# Patient Record
Sex: Female | Born: 1985 | Race: White | Hispanic: No | Smoking: Former smoker
Health system: Southern US, Community
[De-identification: ages and names within clinical notes are randomized; demographics above are authoritative.]

## PROBLEM LIST (undated history)

## (undated) DIAGNOSIS — Z923 Personal history of irradiation: Secondary | ICD-10-CM

## (undated) DIAGNOSIS — F329 Major depressive disorder, single episode, unspecified: Secondary | ICD-10-CM

## (undated) DIAGNOSIS — F419 Anxiety disorder, unspecified: Secondary | ICD-10-CM

## (undated) DIAGNOSIS — C539 Malignant neoplasm of cervix uteri, unspecified: Secondary | ICD-10-CM

## (undated) DIAGNOSIS — F32A Depression, unspecified: Secondary | ICD-10-CM

## (undated) DIAGNOSIS — D701 Agranulocytosis secondary to cancer chemotherapy: Secondary | ICD-10-CM

## (undated) DIAGNOSIS — Z9221 Personal history of antineoplastic chemotherapy: Secondary | ICD-10-CM

## (undated) DIAGNOSIS — T451X5A Adverse effect of antineoplastic and immunosuppressive drugs, initial encounter: Secondary | ICD-10-CM

## (undated) HISTORY — DX: Anxiety disorder, unspecified: F41.9

## (undated) HISTORY — DX: Major depressive disorder, single episode, unspecified: F32.9

## (undated) HISTORY — PX: CYST REMOVAL HAND: SHX6279

## (undated) HISTORY — PX: WISDOM TOOTH EXTRACTION: SHX21

## (undated) HISTORY — DX: Depression, unspecified: F32.A

---

## 2005-05-21 ENCOUNTER — Emergency Department (HOSPITAL_COMMUNITY): Admission: EM | Admit: 2005-05-21 | Discharge: 2005-05-21 | Payer: Self-pay | Admitting: Emergency Medicine

## 2006-09-04 ENCOUNTER — Emergency Department (HOSPITAL_COMMUNITY): Admission: EM | Admit: 2006-09-04 | Discharge: 2006-09-04 | Payer: Self-pay | Admitting: Emergency Medicine

## 2007-11-19 ENCOUNTER — Emergency Department (HOSPITAL_COMMUNITY): Admission: EM | Admit: 2007-11-19 | Discharge: 2007-11-19 | Payer: Self-pay | Admitting: Emergency Medicine

## 2010-11-03 ENCOUNTER — Emergency Department (HOSPITAL_BASED_OUTPATIENT_CLINIC_OR_DEPARTMENT_OTHER)
Admission: EM | Admit: 2010-11-03 | Discharge: 2010-11-03 | Disposition: A | Discharge status: Home / Self Care | Payer: 59 | Attending: Emergency Medicine | Admitting: Emergency Medicine

## 2010-11-03 DIAGNOSIS — F17200 Nicotine dependence, unspecified, uncomplicated: Secondary | ICD-10-CM | POA: Insufficient documentation

## 2010-11-03 DIAGNOSIS — S30860A Insect bite (nonvenomous) of lower back and pelvis, initial encounter: Secondary | ICD-10-CM | POA: Insufficient documentation

## 2010-11-03 DIAGNOSIS — W57XXXA Bitten or stung by nonvenomous insect and other nonvenomous arthropods, initial encounter: Secondary | ICD-10-CM | POA: Insufficient documentation

## 2011-03-24 LAB — CBC
HCT: 40.2
Hemoglobin: 14.2
MCV: 86.2
Platelets: 313
WBC: 10.8 — ABNORMAL HIGH

## 2011-03-24 LAB — URINALYSIS, ROUTINE W REFLEX MICROSCOPIC
Glucose, UA: NEGATIVE
Protein, ur: NEGATIVE
pH: 5.5

## 2011-03-24 LAB — DIFFERENTIAL
Eosinophils Absolute: 0
Eosinophils Relative: 0
Lymphs Abs: 1.2
Monocytes Absolute: 0.8
Monocytes Relative: 7

## 2011-03-24 LAB — COMPREHENSIVE METABOLIC PANEL
BUN: 8
CO2: 25
Calcium: 8.4
Creatinine, Ser: 0.72
GFR calc Af Amer: >60
GFR calc non Af Amer: >60
Glucose, Bld: 107 — ABNORMAL HIGH
Total Bilirubin: 0.6

## 2011-03-24 LAB — LIPASE, BLOOD: Lipase: 12

## 2011-03-24 LAB — URINE MICROSCOPIC-ADD ON

## 2011-07-22 ENCOUNTER — Emergency Department (INDEPENDENT_AMBULATORY_CARE_PROVIDER_SITE_OTHER)
Admission: EM | Admit: 2011-07-22 | Discharge: 2011-07-22 | Disposition: A | Discharge status: Home / Self Care | Payer: Self-pay | Source: Home / Self Care | Attending: Family Medicine | Admitting: Family Medicine

## 2011-07-22 ENCOUNTER — Encounter (HOSPITAL_COMMUNITY): Payer: Self-pay | Admitting: Family Medicine

## 2011-07-22 DIAGNOSIS — J40 Bronchitis, not specified as acute or chronic: Secondary | ICD-10-CM

## 2011-07-22 MED ORDER — ALBUTEROL SULFATE HFA 108 (90 BASE) MCG/ACT IN AERS: 1.0000 | INHALATION_SPRAY | Freq: Four times a day (QID) | RESPIRATORY_TRACT | Status: DC | PRN

## 2011-07-22 MED ORDER — PREDNISONE 20 MG PO TABS: ORAL_TABLET | ORAL | Status: AC

## 2011-07-22 MED ORDER — AZITHROMYCIN 250 MG PO TABS: 250.0000 mg | ORAL_TABLET | Freq: Every day | ORAL | Status: AC

## 2011-07-22 MED ORDER — PHENYLEPHRINE-BROMPHEN-CODEINE 7.5-4-10 MG/5ML PO LIQD: ORAL | Status: DC

## 2011-07-22 NOTE — ED Notes (Signed)
Chest pain onset yesterday.  Difficulty breathing started 07/16/2011.  C/o headache, coughing, runny nose, generalized weakness.  Left lower rib cage pain. No vomiting, but feeling nauseated

## 2011-07-22 NOTE — ED Provider Notes (Signed)
History     CSN: [id number]  Arrival date & time 07/22/11  1746   First MD Initiated Contact with Patient 07/22/11 1800      Chief Complaint  Patient presents with  . Chest Pain    (Consider location/radiation/quality/duration/timing/severity/associated sxs/prior treatment) HPI Comments: 26 y/o female smoker (1pck per day) here c/o productive cough with green sputum and congestion with wheezing and chest tightness for about 5 days. 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 Pain with coughing in left lower ribs since yesterday. No fever or rigors. Feels tired in general. Took guaifenesin for her symptoms with minimal improvement.   History reviewed. No pertinent past medical history.  No past surgical history on file.  No family history on file.  History  Substance Use Topics  . Smoking status: Not on file  . Smokeless tobacco: Not on file  . Alcohol Use:     OB History    Grav Para Term Preterm Abortions TAB SAB Ect Mult Living                  Review of Systems  Constitutional: Positive for fatigue. Negative for fever and chills.  HENT: Positive for congestion and rhinorrhea. Negative for sore throat and trouble swallowing.   Respiratory: Positive for cough, chest tightness and wheezing.   Cardiovascular: Negative for palpitations and leg swelling.  Gastrointestinal: Negative for vomiting, abdominal pain and diarrhea.  Skin: Negative for rash.    Allergies  Penicillins  Home Medications   Current Outpatient Rx  Name Route Sig Dispense Refill  . GUAIFENESIN 100 MG/5ML PO SYRP Oral Take 200 mg by mouth 3 (three) times daily as needed.    Marland Kitchen OVER THE COUNTER MEDICATION      . ALBUTEROL SULFATE HFA 108 (90 BASE) MCG/ACT IN AERS Inhalation Inhale 1-2 puffs into the lungs every 6 (six) hours as needed for wheezing. 1 Inhaler 0  . AZITHROMYCIN 250 MG PO TABS Oral Take 1 tablet (250 mg total) by  mouth daily. Take first 2 tablets together, then 1 every day until finished. 6 tablet 0  . PHENYLEPHRINE-BROMPHEN-CODEINE 7.10-29-08 MG/5ML PO LIQD  5 mls qid prn for cough 100 mL 0  . PREDNISONE 20 MG PO TABS  2 tabs po daily for 5 days 10 tablet no    BP 130/82  Pulse 76  Temp(Src) 99 F (37.2 C) (Oral)  Resp 18  SpO2 100%  LMP 06/21/2011  Physical Exam  Nursing note and vitals reviewed. Constitutional: She is oriented to person, place, and time. She appears well-developed and well-nourished. No distress.  HENT:  Head: Normocephalic and atraumatic.  Right Ear: External ear normal.  Left Ear: External ear normal.  Mouth/Throat: No oropharyngeal exudate.       Nasal Congestion with erythema and swelling of nasal turbinates, clear rhinorrhea. Mild pharyngeal erythema no exudates. No uvula deviation. No trismus. TM's clear fluid behind.   Eyes: Conjunctivae and EOM are normal. Pupils are equal, round, and reactive to light.  Neck: Neck supple.  Cardiovascular: Normal rate, regular rhythm and normal heart sounds.   Pulmonary/Chest: Effort normal. No respiratory distress. She has no wheezes. She has no rales.       Bronchitic cough. Bilateral expiratory rhonchi with cough, no active wheezing, no crackles.  Lymphadenopathy:    She has no cervical adenopathy.  Neurological: She is alert and oriented to person, place, and time.  Skin: No  rash noted.    ED Course  Procedures (including critical care time)  Labs Reviewed - No data to display No results found.   1. Bronchitis       MDM  Encouraged to quit smoking. Chest wall pain related to cough.Treated with azithromycin, albuterol, prednisone, Bromphed . Return if fever or worsening symptoms despite following treatment.       Sharin Grave, MD 07/23/11 2300

## 2011-07-24 ENCOUNTER — Encounter (HOSPITAL_COMMUNITY): Payer: Self-pay | Admitting: Emergency Medicine

## 2013-10-17 ENCOUNTER — Emergency Department (HOSPITAL_COMMUNITY)
Admission: EM | Admit: 2013-10-17 | Discharge: 2013-10-18 | Disposition: A | Discharge status: Home / Self Care | Payer: 59 | Attending: Emergency Medicine | Admitting: Emergency Medicine

## 2013-10-17 ENCOUNTER — Encounter (HOSPITAL_COMMUNITY): Payer: Self-pay | Admitting: Emergency Medicine

## 2013-10-17 DIAGNOSIS — L237 Allergic contact dermatitis due to plants, except food: Secondary | ICD-10-CM

## 2013-10-17 DIAGNOSIS — Z79899 Other long term (current) drug therapy: Secondary | ICD-10-CM | POA: Insufficient documentation

## 2013-10-17 DIAGNOSIS — L255 Unspecified contact dermatitis due to plants, except food: Secondary | ICD-10-CM | POA: Insufficient documentation

## 2013-10-17 DIAGNOSIS — F17200 Nicotine dependence, unspecified, uncomplicated: Secondary | ICD-10-CM | POA: Insufficient documentation

## 2013-10-17 DIAGNOSIS — Z880 Allergy status to penicillin: Secondary | ICD-10-CM | POA: Insufficient documentation

## 2013-10-17 MED ORDER — PREDNISONE 20 MG PO TABS
60.0000 mg | ORAL_TABLET | Freq: Once | ORAL | Status: AC
  Administered 2013-10-18: 60 mg via ORAL
  Filled 2013-10-17: qty 3

## 2013-10-17 MED ORDER — HYDROXYZINE HCL 25 MG PO TABS
25.0000 mg | ORAL_TABLET | Freq: Once | ORAL | Status: AC
  Administered 2013-10-18: 25 mg via ORAL
  Filled 2013-10-17: qty 1

## 2013-10-17 NOTE — ED Provider Notes (Signed)
CSN: [id number]     Arrival date & time 10/17/13  2235 History  This chart was scribed for non-physician practitioner Delos Haring, PA-C working with Varney Biles, MD by Zettie Pho, ED Scribe. This patient was seen in room TR05C/TR05C and the patient's care was started at 12:24 AM.    Chief Complaint  Patient presents with  . Poison Oak   The history is provided by the patient. No language interpreter was used.   HPI Comments: Joanne Griffin is a 28 y.o. female who presents to the Emergency Department complaining of an itching rash to the bilateral arms and legs onset a few days ago. Patient expresses concern that she came into contact with poison oak while doing yard work  5-6 days ago. She states that her current symptoms feel similar to her prior reactions to poison oak. She reports taking Benadryl at home, last dose was about 14 hours ago, and applying calamine lotion to the areas with mild, temporary relief. She denies shortness of breath, wheezing, wheezing, mouth/tongue/lip swelling. She reports an allergy to Penicillins. Patient has no other pertinent medical history.   Past Medical History  Diagnosis Date  . Cyst, dermoid, arm    History reviewed. No pertinent past surgical history. No family history on file. History  Substance Use Topics  . Smoking status: Current Every Day Smoker -- 1.00 packs/day  . Smokeless tobacco: Not on file  . Alcohol Use: No   OB History   Grav Para Term Preterm Abortions TAB SAB Ect Mult Living                 Review of Systems  Respiratory: Negative for shortness of breath and wheezing.   Skin: Positive for rash.  All other systems reviewed and are negative.   Allergies  Penicillins  Home Medications   Prior to Admission medications   Medication Sig Start Date End Date Taking? Authorizing Provider  albuterol (PROVENTIL HFA;VENTOLIN HFA) 108 (90 BASE) MCG/ACT inhaler Inhale 1-2 puffs into the lungs every 6 (six) hours as needed for  wheezing. 07/22/11 07/21/12  Adlih Moreno-Coll, MD  guaifenesin (ROBITUSSIN) 100 MG/5ML syrup Take 200 mg by mouth 3 (three) times daily as needed.    Historical Provider, MD  [place]     Historical Provider, MD  Phenylephrine-Bromphen-Codeine 7.10-29-08 MG/5ML LIQD 5 mls qid prn for cough 07/22/11   Adlih Moreno-Coll, MD   Triage Vitals: BP 119/66  Pulse 95  Temp(Src) 97.5 F (36.4 C) (Oral)  Resp 20  SpO2 100%  LMP 10/10/2013  Physical Exam  Nursing note and vitals reviewed. Constitutional: She is oriented to person, place, and time. She appears well-developed and well-nourished. No distress.  HENT:  Head: Normocephalic and atraumatic.  No pharyngeal, lip, or tongue swelling.   Eyes: Conjunctivae are normal.  No lesions about the eyes.   Neck: Normal range of motion. Neck supple.  Pulmonary/Chest: Effort normal. No respiratory distress. She has no wheezes.  Abdominal: She exhibits no distension.  Musculoskeletal: Normal range of motion.  Neurological: She is alert and oriented to person, place, and time.  Skin: Skin is warm and dry. Rash noted. Rash is urticarial.  Diffuse rash to arms, legs. The rash spares the chest, back, face.   Skin heavily coated in calamine lotion.  Psychiatric: She has a normal mood and affect. Her behavior is normal.    ED Course  Procedures (including critical care time)  DIAGNOSTIC STUDIES: Oxygen Saturation is 100% on room air,  normal by my interpretation.    COORDINATION OF CARE: 11:45 PM- Ordered Atarax and prednisone 60 mg PO.   12:26 AM- Patient states that the itching has only slightly improved after receiving the medications. Advised of further symptomatic care at home. Return precautions given. Discussed treatment plan with patient at bedside and patient verbalized agreement.     Labs Review Labs Reviewed - No data to display  Imaging Review No results found.   EKG Interpretation None      MDM   Final  diagnoses:  Poison oak dermatitis    Pt given prednisone and Atarax in the ED to treat symptoms. NO systemic symptoms or upper/lower airway involvement. She will need to be on a prednisone dose pack at home. Will Rx Atarax for itching and return precautions given.  28 y.o.[place] evaluation in the Emergency Department is complete. It has been determined that no acute conditions requiring further emergency intervention are present at this time. The patient/guardian have been advised of the diagnosis and plan. We have discussed signs and symptoms that warrant return to the ED, such as changes or worsening in symptoms.  Vital signs are stable at discharge. Filed Vitals:   10/18/13 0054  BP: 114/72  Pulse: 77  Temp:   Resp:     Patient/guardian has voiced understanding and agreed to follow-up with the PCP or specialist.  I personally performed the services described in this documentation, which was scribed in my presence. The recorded information has been reviewed and is accurate.    Linus Mako, PA-C 10/18/13 1322

## 2013-10-17 NOTE — ED Notes (Signed)
Pt reports doing yard work a few days ago and has not developed poison oak. Bumps to bilateral arms and legs. Pt put Calamine lotion on and has taken benadryl, last dose this am.

## 2013-10-18 MED ORDER — METHYLPREDNISOLONE SODIUM SUCC 125 MG IJ SOLR: 125.0000 mg | Freq: Once | INTRAMUSCULAR | Status: DC

## 2013-10-18 MED ORDER — HYDROXYZINE HCL 25 MG PO TABS: 25.0000 mg | ORAL_TABLET | Freq: Four times a day (QID) | ORAL | Status: DC

## 2013-10-18 MED ORDER — PREDNISONE 10 MG PO TABS: 20.0000 mg | ORAL_TABLET | Freq: Every day | ORAL | Status: DC

## 2013-10-18 NOTE — Discharge Instructions (Signed)
Poison Ivy Poison ivy is a inflammation of the skin (contact dermatitis) caused by touching the allergens on the leaves of the ivy plant following previous exposure to the plant. The rash usually appears 48 hours after exposure. The rash is usually bumps (papules) or blisters (vesicles) in a linear pattern. Depending on your own sensitivity, the rash may simply cause redness and itching, or it may also progress to blisters which may break open. These must be well cared for to prevent secondary bacterial (germ) infection, followed by scarring. Keep any open areas dry, clean, dressed, and covered with an antibacterial ointment if needed. The eyes may also get puffy. The puffiness is worst in the morning and gets better as the day progresses. This dermatitis usually heals without scarring, within 2 to 3 weeks without treatment. HOME CARE INSTRUCTIONS  Thoroughly wash with soap and water as soon as you have been exposed to poison ivy. You have about one half hour to remove the plant resin before it will cause the rash. This washing will destroy the oil or antigen on the skin that is causing, or will cause, the rash. Be sure to wash under your fingernails as any plant resin there will continue to spread the rash. Do not rub skin vigorously when washing affected area. Poison ivy cannot spread if no oil from the plant remains on your body. A rash that has progressed to weeping sores will not spread the rash unless you have not washed thoroughly. It is also important to wash any clothes you have been wearing as these may carry active allergens. The rash will return if you wear the unwashed clothing, even several days later. Avoidance of the plant in the future is the best measure. Poison ivy plant can be recognized by the number of leaves. Generally, poison ivy has three leaves with flowering branches on a single stem. Diphenhydramine may be purchased over the counter and used as needed for itching. Do not drive with  this medication if it makes you drowsy.Ask your caregiver about medication for children. SEEK MEDICAL CARE IF:  Open sores develop.  Redness spreads beyond area of rash.  You notice purulent (pus-like) discharge.  You have increased pain.  Other signs of infection develop (such as fever). Document Released: 06/11/2000 Document Revised: 09/06/2011 Document Reviewed: 04/30/2009 [employer] Patient Information 2014 [employer].  

## 2013-10-19 NOTE — ED Provider Notes (Signed)
Medical screening examination/treatment/procedure(s) were performed by non-physician practitioner and as supervising physician I was immediately available for consultation/collaboration.   EKG Interpretation None       Varney Biles, MD 10/19/13 [phone]

## 2015-09-24 ENCOUNTER — Emergency Department (HOSPITAL_COMMUNITY)
Admission: EM | Admit: 2015-09-24 | Discharge: 2015-09-24 | Disposition: A | Discharge status: Home / Self Care | Payer: Self-pay | Attending: Emergency Medicine | Admitting: Emergency Medicine

## 2015-09-24 ENCOUNTER — Encounter (HOSPITAL_COMMUNITY): Payer: Self-pay | Admitting: Emergency Medicine

## 2015-09-24 DIAGNOSIS — L918 Other hypertrophic disorders of the skin: Secondary | ICD-10-CM | POA: Insufficient documentation

## 2015-09-24 DIAGNOSIS — Z7952 Long term (current) use of systemic steroids: Secondary | ICD-10-CM | POA: Insufficient documentation

## 2015-09-24 DIAGNOSIS — F17200 Nicotine dependence, unspecified, uncomplicated: Secondary | ICD-10-CM | POA: Insufficient documentation

## 2015-09-24 DIAGNOSIS — Z3202 Encounter for pregnancy test, result negative: Secondary | ICD-10-CM | POA: Insufficient documentation

## 2015-09-24 DIAGNOSIS — K6289 Other specified diseases of anus and rectum: Secondary | ICD-10-CM | POA: Insufficient documentation

## 2015-09-24 DIAGNOSIS — Z880 Allergy status to penicillin: Secondary | ICD-10-CM | POA: Insufficient documentation

## 2015-09-24 DIAGNOSIS — N390 Urinary tract infection, site not specified: Secondary | ICD-10-CM | POA: Insufficient documentation

## 2015-09-24 DIAGNOSIS — Z86018 Personal history of other benign neoplasm: Secondary | ICD-10-CM | POA: Insufficient documentation

## 2015-09-24 DIAGNOSIS — Z79899 Other long term (current) drug therapy: Secondary | ICD-10-CM | POA: Insufficient documentation

## 2015-09-24 LAB — URINALYSIS, ROUTINE W REFLEX MICROSCOPIC
BILIRUBIN URINE: NEGATIVE
Glucose, UA: NEGATIVE mg/dL
Ketones, ur: NEGATIVE mg/dL
Nitrite: NEGATIVE
PH: 8 (ref 5.0–8.0)
Protein, ur: 30 mg/dL — AB
SPECIFIC GRAVITY, URINE: 1.024 (ref 1.005–1.030)

## 2015-09-24 LAB — URINE MICROSCOPIC-ADD ON

## 2015-09-24 LAB — POC URINE PREG, ED: Preg Test, Ur: NEGATIVE

## 2015-09-24 MED ORDER — CEPHALEXIN 500 MG PO CAPS: 500.0000 mg | ORAL_CAPSULE | Freq: Four times a day (QID) | ORAL | Status: DC

## 2015-09-24 NOTE — ED Notes (Signed)
Pt sts lower abd pain and pain in rectum area x 2 days

## 2015-09-24 NOTE — Discharge Instructions (Signed)

## 2015-09-24 NOTE — ED Provider Notes (Signed)
CSN: [id number]     Arrival date & time 09/24/15  1311 History  By signing my name below, I, Eustaquio Maize, attest that this documentation has been prepared under the direction and in the presence of Alyse Low, [place].  Electronically Signed: Eustaquio Maize, ED Scribe. 09/24/2015. 5:05 PM.   Chief Complaint  Patient presents with  . Abdominal Pain   The history is provided by the patient. No language interpreter was used.     HPI Comments: Joanne Griffin is a 30 y.o. female who presents to the Emergency Department complaining of sudden onset, intermittent, sharp, rectal pain x 2 days. Pt's last normal bowel movement was this morning but she denies any relief from the pain afterwards. Pt also complains of lower abdominal pain that is exacerbated with applying pressure to the area. No hx hemorrhoids. Denies constipation, nausea, vomiting, or any other associated symptoms. Pt states she has not had a period in "awhile" but denies risk of pregnancy due to not having intercourse with males. Pt is everyday smoker.    Past Medical History  Diagnosis Date  . Cyst, dermoid, arm    History reviewed. No pertinent past surgical history. History reviewed. No pertinent family history. Social History  Substance Use Topics  . Smoking status: Current Every Day Smoker -- 1.00 packs/day  . Smokeless tobacco: None  . Alcohol Use: No   OB History    No data available     Review of Systems  Constitutional: Negative for fever.  Gastrointestinal: Positive for abdominal pain and rectal pain. Negative for nausea and vomiting.  All other systems reviewed and are negative.  Allergies  Penicillins  Home Medications   Prior to Admission medications   Medication Sig Start Date End Date Taking? Authorizing Provider  albuterol (PROVENTIL HFA;VENTOLIN HFA) 108 (90 BASE) MCG/ACT inhaler Inhale 1-2 puffs into the lungs every 6 (six) hours as needed for wheezing. 07/22/11 07/21/12  Adlih Moreno-Coll, MD   guaifenesin (ROBITUSSIN) 100 MG/5ML syrup Take 200 mg by mouth 3 (three) times daily as needed.    Historical Provider, MD  hydrOXYzine (ATARAX/VISTARIL) 25 MG tablet Take 1 tablet (25 mg total) by mouth every 6 (six) hours. 10/18/13   Tiffany Carlota Raspberry, PA-C  OVER THE COUNTER MEDICATION     Historical Provider, MD  Phenylephrine-Bromphen-Codeine 7.10-29-08 MG/5ML LIQD 5 mls qid prn for cough 07/22/11   Adlih Moreno-Coll, MD  predniSONE (DELTASONE) 10 MG tablet Take 2 tablets (20 mg total) by mouth daily. 10/18/13   Tiffany Carlota Raspberry, PA-C   BP 111/76 mmHg  Pulse 80  Temp(Src) 98.3 F (36.8 C) (Oral)  Resp 18  SpO2 100%   Physical Exam  Constitutional: She is oriented to person, place, and time. She appears well-developed and well-nourished. No distress.  HENT:  Head: Normocephalic and atraumatic.  Eyes: Conjunctivae and EOM are normal.  Neck: Neck supple. No tracheal deviation present.  Cardiovascular: Normal rate.   Pulmonary/Chest: Effort normal. No respiratory distress.  Abdominal: There is tenderness in the suprapubic area. There is no guarding.  No peritoneal signs.   Genitourinary: Rectal exam shows no external hemorrhoid and no internal hemorrhoid.  No hemorrhoids.  Small skin tag.   Musculoskeletal: Normal range of motion.  Neurological: She is alert and oriented to person, place, and time.  Skin: Skin is warm and dry.  Psychiatric: She has a normal mood and affect. Her behavior is normal.  Nursing note and vitals reviewed.   ED Course  Procedures (including critical care time)  DIAGNOSTIC STUDIES: Oxygen Saturation is 99% on RA, normal by my interpretation.    COORDINATION OF CARE: 5:05 PM-Discussed treatment plan with pt at bedside and pt agreed to plan.   Labs Review Labs Reviewed  URINALYSIS, ROUTINE W REFLEX MICROSCOPIC (NOT AT [facility]) - Abnormal; Notable for the following:    APPearance CLOUDY (*)    Hgb urine dipstick SMALL (*)    Protein, ur 30 (*)    Leukocytes,  UA LARGE (*)    All other components within normal limits  URINE MICROSCOPIC-ADD ON - Abnormal; Notable for the following:    Squamous Epithelial / LPF 6-30 (*)    Bacteria, UA MANY (*)    All other components within normal limits  POC URINE PREG, ED    Imaging Review No results found. I have personally reviewed and evaluated these lab results as part of my medical decision-making.   EKG Interpretation None      MDM   Final diagnoses:  UTI (lower urinary tract infection)   Pt has UTI. No evidence of acute abdomen. I suspect lower abdominal pain and rectal pain due to bladder infectoin/UTI. Will treat with Keflex. Pt advised to return if symptoms worsen or change.   Meds ordered this encounter  Medications  . DISCONTD: cephALEXin (KEFLEX) 500 MG capsule    Sig: Take 1 capsule (500 mg total) by mouth 4 (four) times daily.    Dispense:  40 capsule    Refill:  0    Order Specific Question:  Supervising Provider    Answer:  MILLER, BRIAN [[id number]]  . cephALEXin (KEFLEX) 500 MG capsule    Sig: Take 1 capsule (500 mg total) by mouth 4 (four) times daily.    Dispense:  40 capsule    Refill:  0    Order Specific Question:  Supervising Provider    Answer:  Noemi Chapel [[id number]]  An After Visit Summary was printed and given to the patient.     Hollace Kinnier Chubbuck, PA-C 09/24/15 2024  Leonard Schwartz, MD 09/25/15 2133

## 2015-09-24 NOTE — ED Notes (Signed)
Pt verbalized understanding of d/c instructions and use of keflex and has no further questions. Pt stable and NAD

## 2016-07-29 ENCOUNTER — Encounter (HOSPITAL_COMMUNITY): Payer: Self-pay

## 2016-07-29 ENCOUNTER — Emergency Department (HOSPITAL_COMMUNITY)
Admission: EM | Admit: 2016-07-29 | Discharge: 2016-07-29 | Disposition: A | Discharge status: Home / Self Care | Payer: Self-pay | Attending: Emergency Medicine | Admitting: Emergency Medicine

## 2016-07-29 ENCOUNTER — Emergency Department (HOSPITAL_COMMUNITY): Payer: Self-pay

## 2016-07-29 DIAGNOSIS — N889 Noninflammatory disorder of cervix uteri, unspecified: Secondary | ICD-10-CM

## 2016-07-29 DIAGNOSIS — F17200 Nicotine dependence, unspecified, uncomplicated: Secondary | ICD-10-CM | POA: Insufficient documentation

## 2016-07-29 DIAGNOSIS — R109 Unspecified abdominal pain: Secondary | ICD-10-CM

## 2016-07-29 DIAGNOSIS — R87619 Unspecified abnormal cytological findings in specimens from cervix uteri: Secondary | ICD-10-CM | POA: Insufficient documentation

## 2016-07-29 DIAGNOSIS — N12 Tubulo-interstitial nephritis, not specified as acute or chronic: Secondary | ICD-10-CM | POA: Insufficient documentation

## 2016-07-29 DIAGNOSIS — Z79899 Other long term (current) drug therapy: Secondary | ICD-10-CM | POA: Insufficient documentation

## 2016-07-29 LAB — CBC WITH DIFFERENTIAL/PLATELET
BASOS ABS: 0 10*3/uL (ref 0.0–0.1)
BASOS PCT: 0 %
EOS ABS: 0.2 10*3/uL (ref 0.0–0.7)
EOS PCT: 2 %
HCT: 34.8 % — ABNORMAL LOW (ref 36.0–46.0)
Hemoglobin: 11.5 g/dL — ABNORMAL LOW (ref 12.0–15.0)
LYMPHS PCT: 25 %
Lymphs Abs: 2.7 10*3/uL (ref 0.7–4.0)
MCH: 28.1 pg (ref 26.0–34.0)
MCHC: 33 g/dL (ref 30.0–36.0)
MCV: 85.1 fL (ref 78.0–100.0)
Monocytes Absolute: 0.7 10*3/uL (ref 0.1–1.0)
Monocytes Relative: 6 %
Neutro Abs: 6.9 10*3/uL (ref 1.7–7.7)
Neutrophils Relative %: 67 %
PLATELETS: 399 10*3/uL (ref 150–400)
RBC: 4.09 MIL/uL (ref 3.87–5.11)
RDW: 13.2 % (ref 11.5–15.5)
WBC: 10.5 10*3/uL (ref 4.0–10.5)

## 2016-07-29 LAB — COMPREHENSIVE METABOLIC PANEL
ALT: 16 U/L (ref 14–54)
AST: 15 U/L (ref 15–41)
Albumin: 3.5 g/dL (ref 3.5–5.0)
Alkaline Phosphatase: 49 U/L (ref 38–126)
Anion gap: 11 (ref 5–15)
BUN: 6 mg/dL (ref 6–20)
CHLORIDE: 103 mmol/L (ref 101–111)
CO2: 22 mmol/L (ref 22–32)
Calcium: 8.9 mg/dL (ref 8.9–10.3)
Creatinine, Ser: 0.74 mg/dL (ref 0.44–1.00)
GFR calc Af Amer: >60 mL/min (ref >60)
GFR calc non Af Amer: >60 mL/min (ref >60)
Glucose, Bld: 99 mg/dL (ref 65–99)
POTASSIUM: 3.5 mmol/L (ref 3.5–5.1)
SODIUM: 136 mmol/L (ref 135–145)
Total Bilirubin: 0.5 mg/dL (ref 0.3–1.2)
Total Protein: 6.6 g/dL (ref 6.5–8.1)

## 2016-07-29 LAB — URINALYSIS, ROUTINE W REFLEX MICROSCOPIC
BILIRUBIN URINE: NEGATIVE
Glucose, UA: NEGATIVE mg/dL
KETONES UR: 15 mg/dL — AB
NITRITE: NEGATIVE
PROTEIN: 30 mg/dL — AB
SPECIFIC GRAVITY, URINE: 1.025 (ref 1.005–1.030)
pH: 6 (ref 5.0–8.0)

## 2016-07-29 LAB — WET PREP, GENITAL
CLUE CELLS WET PREP: NONE SEEN
Sperm: NONE SEEN
TRICH WET PREP: NONE SEEN
Yeast Wet Prep HPF POC: NONE SEEN

## 2016-07-29 LAB — URINALYSIS, MICROSCOPIC (REFLEX)

## 2016-07-29 LAB — POC URINE PREG, ED: PREG TEST UR: NEGATIVE

## 2016-07-29 MED ORDER — CEPHALEXIN 500 MG PO CAPS: 500.0000 mg | ORAL_CAPSULE | Freq: Three times a day (TID) | ORAL | 0 refills | Status: AC

## 2016-07-29 MED ORDER — NAPROXEN 500 MG PO TABS: 500.0000 mg | ORAL_TABLET | Freq: Two times a day (BID) | ORAL | 0 refills | Status: DC

## 2016-07-29 MED ORDER — CEFTRIAXONE SODIUM 250 MG IJ SOLR
250.0000 mg | Freq: Once | INTRAMUSCULAR | Status: AC
  Administered 2016-07-29: 250 mg via INTRAMUSCULAR
  Filled 2016-07-29: qty 250

## 2016-07-29 MED ORDER — LIDOCAINE HCL (PF) 1 % IJ SOLN
INTRAMUSCULAR | Status: AC
  Administered 2016-07-29: 1 mL
  Filled 2016-07-29: qty 5

## 2016-07-29 MED ORDER — AZITHROMYCIN 1 G PO PACK
1.0000 g | PACK | Freq: Once | ORAL | Status: AC
  Administered 2016-07-29: 1 g via ORAL
  Filled 2016-07-29: qty 1

## 2016-07-29 NOTE — ED Notes (Signed)
Pt has returned from CT at this time

## 2016-07-29 NOTE — ED Provider Notes (Addendum)
[facility] DEPT Provider Note   CSN: [id number] Arrival date & time: 07/29/16  1243  By signing my name below, I, Evelene Croon, attest that this documentation has been prepared under the direction and in the presence of Gareth Morgan, MD . Electronically Signed: Evelene Croon, Scribe. 07/29/2016. 3:09 PM.  History   Chief Complaint Chief Complaint  Patient presents with  . Flank Pain   The history is provided by the patient. No language interpreter was used.    HPI Comments:  Joanne Griffin is a 31 y.o. female who presents to the Emergency Department complaining of waxing and waning, sharp, right flank pain x a few days. She states her pain radiates to the left. The pain is severe. She reports associated dysuria and hematuria. Pt denies h/o kidney stones. No fever. No recent fall or trauma. No IVDA.  Yesterday she had 1 episode of diarrhea; no BM today. No alleviating factors noted. Reports vaginal discharge since November.  Past Medical History:  Diagnosis Date  . Cyst, dermoid, arm     There are no active problems to display for this patient.   History reviewed. No pertinent surgical history.  OB History    No data available       Home Medications    Prior to Admission medications   Medication Sig Start Date End Date Taking? Authorizing Provider  acetaminophen (TYLENOL) 500 MG tablet Take 1,000 mg by mouth every 6 (six) hours as needed for moderate pain.   Yes Historical Provider, MD  Sennosides (EX-LAX) 15 MG TABS Take 15 mg by mouth daily as needed (CONSTIPATION).   Yes Historical Provider, MD  cephALEXin (KEFLEX) 500 MG capsule Take 1 capsule (500 mg total) by mouth 3 (three) times daily. 07/29/16 08/12/16  Gareth Morgan, MD  naproxen (NAPROSYN) 500 MG tablet Take 1 tablet (500 mg total) by mouth 2 (two) times daily with a meal. 07/29/16   Gareth Morgan, MD    Family History No family history on file.  Social History Social History  Substance Use Topics  .  Smoking status: Current Every Day Smoker    Packs/day: 1.00  . Smokeless tobacco: Not on file  . Alcohol use No     Allergies   Penicillins   Review of Systems Review of Systems  Constitutional: Negative for fever.  HENT: Negative for sore throat.   Eyes: Negative for visual disturbance.  Respiratory: Negative for cough and shortness of breath.   Cardiovascular: Negative for chest pain.  Gastrointestinal: Positive for diarrhea. Negative for abdominal pain, nausea and vomiting.  Genitourinary: Positive for dysuria, flank pain, hematuria, vaginal bleeding (cant tell if urine or vaginal) and vaginal discharge. Negative for difficulty urinating.  Musculoskeletal: Negative for back pain and neck pain.  Skin: Negative for rash.  Neurological: Negative for syncope and headaches.  All other systems reviewed and are negative.    Physical Exam Updated Vital Signs BP 124/81 (BP Location: Left Arm)   Pulse 90   Temp 97.9 F (36.6 C) (Oral)   Resp 18   SpO2 99%   Physical Exam  Constitutional: She is oriented to person, place, and time. She appears well-developed and well-nourished. No distress.  HENT:  Head: Normocephalic and atraumatic.  Eyes: Conjunctivae and EOM are normal.  Neck: Normal range of motion.  Cardiovascular: Normal rate, regular rhythm, normal heart sounds and intact distal pulses.  Exam reveals no gallop and no friction rub.   No murmur heard. Pulmonary/Chest: Effort normal and breath sounds normal.  No respiratory distress. She has no wheezes. She has no rales.  Abdominal: Soft. She exhibits no distension. There is no tenderness. There is CVA tenderness (on the right ). There is no guarding.  Genitourinary: Uterus is tender. Cervix exhibits motion tenderness, discharge and friability. Right adnexum displays no tenderness. Left adnexum displays no tenderness. There is bleeding in the vagina.  Genitourinary Comments: Macerated, friable cervix with yellow fluid and  blood  Chaperone (scribe) was present for exam which was performed with no discomfort or complications.   Musculoskeletal: She exhibits no edema or tenderness.  Neurological: She is alert and oriented to person, place, and time.  Skin: Skin is warm and dry. No rash noted. She is not diaphoretic. No erythema.  Nursing note and vitals reviewed.    ED Treatments / Results  DIAGNOSTIC STUDIES:  Oxygen Saturation is 99% on RA, normal by my interpretation.    COORDINATION OF CARE:  1:38 PM Discussed treatment plan with pt at bedside and pt agreed to plan.  Labs (all labs ordered are listed, but only abnormal results are displayed) Labs Reviewed  WET PREP, GENITAL - Abnormal; Notable for the following:       Result Value   WBC, Wet Prep HPF POC FEW (*)    All other components within normal limits  URINALYSIS, ROUTINE W REFLEX MICROSCOPIC - Abnormal; Notable for the following:    APPearance HAZY (*)    Hgb urine dipstick LARGE (*)    Ketones, ur 15 (*)    Protein, ur 30 (*)    Leukocytes, UA SMALL (*)    All other components within normal limits  CBC WITH DIFFERENTIAL/PLATELET - Abnormal; Notable for the following:    Hemoglobin 11.5 (*)    HCT 34.8 (*)    All other components within normal limits  URINALYSIS, MICROSCOPIC (REFLEX) - Abnormal; Notable for the following:    Bacteria, UA MANY (*)    Squamous Epithelial / LPF 0-5 (*)    All other components within normal limits  URINE CULTURE  COMPREHENSIVE METABOLIC PANEL  POC URINE PREG, ED  GC/CHLAMYDIA PROBE AMP ([place]) NOT AT [facility]    EKG  EKG Interpretation None       Radiology Ct Renal Stone Study  Result Date: 07/29/2016 CLINICAL DATA:  Patient with right flank pain for multiple days. Hematuria. EXAM: CT ABDOMEN AND PELVIS WITHOUT CONTRAST TECHNIQUE: Multidetector CT imaging of the abdomen and pelvis was performed following the standard protocol without IV contrast. COMPARISON:  None. FINDINGS: Lower chest:  Normal heart size. No consolidative pulmonary opacities. No pleural effusion. Hepatobiliary: Liver is normal in size and contour. Gallbladder is unremarkable. Pancreas: Unremarkable Spleen: Unremarkable Adrenals/Urinary Tract: Adrenal glands are normal. Kidneys are symmetric in size. No hydronephrosis. Urinary bladder is unremarkable. No nephroureterolithiasis. No hydronephrosis. Stomach/Bowel: No abnormal bowel wall thickening or evidence for bowel obstruction. No free fluid or free intraperitoneal air. Vascular/Lymphatic: Normal caliber abdominal aorta. No retroperitoneal lymphadenopathy. Reproductive: Uterus and bilateral adnexa are unremarkable. Other: None. Musculoskeletal: No aggressive or acute appearing osseous lesions. IMPRESSION: No acute process within the abdomen or pelvis. No nephroureterolithiasis.  No hydronephrosis. Electronically Signed   By: Lovey Newcomer M.D.   On: 07/29/2016 15:23    Procedures Procedures (including critical care time)  Medications Ordered in ED Medications  cefTRIAXone (ROCEPHIN) injection 250 mg (250 mg Intramuscular Given 07/29/16 1626)  azithromycin (ZITHROMAX) powder 1 g (1 g Oral Given 07/29/16 1625)  lidocaine (PF) (XYLOCAINE) 1 % injection (1 mL  Given 07/29/16 1627)     Initial Impression / Assessment and Plan / ED Course  I have reviewed the triage vital signs and the nursing notes.  Pertinent labs & imaging results that were available during my care of the patient were reviewed by me and considered in my medical decision making (see chart for details).     31 year old female in no severe medical history presents with concern for right flank pain. CT stone study was negative. Urinalysis concerning for urinary tract infection. Patient also noted vaginal discharge, and pelvic exam was done which showed a friable, macerated cervix. Left message for OB on-call, and provided number for women's clinic for urgent follow-up. Given the appearance of her cervix, have  lower suspicion that her lower abdominal tenderness on exam is related to PID, and more likely combination of other process affecting her cervix, as well as urinary tract infection. We'll treat for polynephritis with 2 weeks of Keflex. Patient was given naproxen for pain. She was empirically given Rocephin and azithromycin after discussion.  Patient discharged in stable condition with understanding of reasons to return.   Final Clinical Impressions(s) / ED Diagnoses   Final diagnoses:  Right flank pain  Cervix abnormality  Pyelonephritis    New Prescriptions New Prescriptions   CEPHALEXIN (KEFLEX) 500 MG CAPSULE    Take 1 capsule (500 mg total) by mouth 3 (three) times daily.   NAPROXEN (NAPROSYN) 500 MG TABLET    Take 1 tablet (500 mg total) by mouth 2 (two) times daily with a meal.   I personally performed the services described in this documentation, which was scribed in my presence. The recorded information has been reviewed and is accurate.     Gareth Morgan, MD 07/29/16 1701    Gareth Morgan, MD 07/29/16 [id number]

## 2016-07-29 NOTE — ED Triage Notes (Signed)
Patient here with right flank pain intermittently x 2 weeks. Reports hematuria with same, states today the pain is more constant, no distress.

## 2016-07-29 NOTE — ED Notes (Signed)
Pt transported to CT at this time.

## 2016-07-30 LAB — URINE CULTURE

## 2016-07-30 LAB — GC/CHLAMYDIA PROBE AMP (~~LOC~~) NOT AT ARMC
CHLAMYDIA, DNA PROBE: NEGATIVE
Neisseria Gonorrhea: NEGATIVE

## 2016-08-19 ENCOUNTER — Encounter: Payer: Self-pay | Admitting: Obstetrics & Gynecology

## 2016-08-19 ENCOUNTER — Ambulatory Visit (INDEPENDENT_AMBULATORY_CARE_PROVIDER_SITE_OTHER): Payer: Self-pay | Admitting: Obstetrics & Gynecology

## 2016-08-19 VITALS — BP 115/76 | HR 80 | Wt 146.9 lb

## 2016-08-19 DIAGNOSIS — N888 Other specified noninflammatory disorders of cervix uteri: Secondary | ICD-10-CM

## 2016-08-19 NOTE — Progress Notes (Signed)
History:  31 y.o. G0 here today for eval of lesion on cervix seen in ED. She was seen 07/29/2016 for a UTI. Pt reports that her last PAP smear 'years ago maybe at age 58 year.'  No h/o STIs. +tob 1/2 PPD.  The pts partner reports tha tpt has had irreg bleeding.    The following portions of the patient's history were reviewed and updated as appropriate: allergies, current medications, past family history, past medical history, past social history, past surgical history and problem list.  Review of Systems:  Pertinent items are noted in HPI.   Objective:  Physical Exam Blood pressure 115/76, pulse 80, weight 146 lb 14.4 oz (66.6 kg). Gen: NAD Pelvic: Normal appearing external genitalia; normal appearing vaginal mucosa. Cervix has a friable exophytic mass.  A bx was not done (see below).     Assessment & Plan:  Cervical mass- suspect malignancy. Pt needs a bx ASAP  Will refer to [facility] for eval and referral.  Pt to f/u after [facility] appt for a cervical bx. I expressed to her and her partner that I suspect cervical cancer and how important it is to get this diagnosed early. I explained to them why she is being referred to [facility] prior to the bx. They both expressed understanding.    Minsa Weddington L. Harraway-Smith, M.D., Cherlynn June

## 2016-08-19 NOTE — Patient Instructions (Signed)
Cervical Biopsy °A cervical biopsy is a procedure to remove a small sample of tissue from the cervix. The cervix is the lowest part of the womb (uterus), which opens into the vagina (birth canal). °You may have this procedure to check for cancer or growths that may become cancer. This procedure may also be done if the results of your Pap test were abnormal or if your health care provider saw an abnormality during a pelvic exam. °Tell a health care provider about: °· Any allergies you have. °· All medicines you are taking, including vitamins, herbs, eye drops, creams, and over-the-counter medicines. °· Any problems you or family members have had with anesthetic medicines. °· Any blood disorders you have. °· Any surgeries you have had. °· Any medical conditions you have. °· Whether you are pregnant or may be pregnant. °· Whether you are having your menstrual period or will be having your period at the time of the procedure. °What are the risks? °Generally, this is a safe procedure. However, problems may occur, including: °· Infection. °· Bleeding. °· Allergic reactions to medicines or dyes. °· Damage to other structures or organs. °What happens before the procedure? °· Do not douche, have sex, use tampons, or use any vaginal medicines before the procedure as told by your health care provider. °· Follow instructions from your health care provider about eating or drinking restrictions. °· Ask your health care provider about: °¨ Changing or stopping your regular medicines. This is especially important if you are taking diabetes medicines or blood thinners. °¨ Taking medicines such as aspirin and ibuprofen. These medicines can thin your blood. Do not take these medicines before your procedure if your health care provider instructs you not to. °· You may be given an over-the-counter pain medicine to take right before the procedure. °· You may be asked to empty your bladder and bowel right before the procedure. °What  happens during the procedure? °· You will undress from the waist down. °· You will lie on an examining table and put your feet in stirrups. °· To reduce your risk of infection: °¨ Your health care team will wash or sanitize their hands. °¨ Your skin will be washed with soap. °· Your health care provider will use a lubricated instrument (speculum) to open your vagina. An instrument that has a magnifying lens and a light (colposcope) will let your health care provider examine the cervix more closely. °· You may be given a medicine to numb the area (local anesthetic). °· Your health care provider will apply a solution to your cervix. This turns abnormal areas a pale color. °· Your health care provider will use an instrument (biopsy forceps) to take one or more small pieces of tissue that appear abnormal. °· If there seems to be an abnormal area in the part of your cervix that leads to the uterus (endocervical canal), your health care provider will use an instrument (curette) to scrape tissue from that area. This is called endocervical curettage. °· Your health care provider will apply a paste over the biopsy areas to help control bleeding. °The procedure may vary among health care providers and hospitals. °What happens after the procedure? °It is your responsibility to get the results of your procedure. Ask your health care provider or the department performing the procedure when your results will be ready. °This information is not intended to replace advice given to you by your health care provider. Make sure you discuss any questions you have with your health   care provider. °Document Released: 06/14/2005 Document Revised: 10/23/2015 Document Reviewed: 10/30/2014 °Elsevier Interactive Patient Education © 2017 [employer]vier Inc. ° °

## 2016-08-23 ENCOUNTER — Encounter: Payer: Self-pay | Admitting: Obstetrics & Gynecology

## 2016-08-23 ENCOUNTER — Telehealth (HOSPITAL_COMMUNITY): Payer: Self-pay | Admitting: *Deleted

## 2016-08-23 NOTE — Telephone Encounter (Signed)
Telephoned patient at home number and left message to return call to [facility] 

## 2016-08-26 ENCOUNTER — Ambulatory Visit (HOSPITAL_COMMUNITY)
Admission: RE | Admit: 2016-08-26 | Discharge: 2016-08-26 | Disposition: A | Discharge status: Home / Self Care | Payer: Medicaid Other | Source: Ambulatory Visit | Attending: Obstetrics and Gynecology | Admitting: Obstetrics and Gynecology

## 2016-08-26 ENCOUNTER — Encounter (HOSPITAL_COMMUNITY): Payer: Self-pay

## 2016-08-26 VITALS — BP 114/70 | Temp 97.7°F | Ht 62.0 in | Wt 148.8 lb

## 2016-08-26 DIAGNOSIS — Z01419 Encounter for gynecological examination (general) (routine) without abnormal findings: Secondary | ICD-10-CM | POA: Diagnosis present

## 2016-08-26 NOTE — Addendum Note (Signed)
Encounter addended by: Loletta Parish, RN on: 08/26/2016  3:26 PM<BR>    Actions taken: Sign clinical note

## 2016-08-26 NOTE — Progress Notes (Signed)
Complaints of pain and bleeding with intercourse. Patient referred to [facility] by the Center for [place] [facility].  Pap Smear: Pap smear completed today. Last Pap smear was when patient was around 31 year old and normal per patient. Per patient has no history of an abnormal Pap smear. No Pap smear results are in EPIC.  Physical exam: Breasts Breasts symmetrical. No skin abnormalities bilateral breasts. No nipple retraction bilateral breasts. No nipple discharge bilateral breasts. No lymphadenopathy. No lumps palpated bilateral breasts. No complaints of pain or tenderness on exam. Screening mammogram recommended at age 34 unless clinically indicated prior.  Pelvic/Bimanual   Ext Genitalia No lesions, no swelling and no discharge observed on external genitalia.         Vagina Vagina pink and normal texture. No lesions and a yellowish watery colored discharge observed in vagina.          Cervix Cervix is present. Cervix is red and irregular with a mass located on cervix. Cervix friable. No discharge observed.     Uterus Uterus is present and palpable. Uterus in normal position and normal size.        Adnexae Bilateral ovaries present and palpable. No tenderness on palpation.          Rectovaginal No rectal exam completed today since patient had no rectal complaints. No skin abnormalities observed on exam.    Smoking History: Patient is a current smoker. Discussed smoking cessation with patient. Referred patient to the [facility] Quitline and gave resources to the free smoking cessation classes offered through [facility].  Patient Navigation: Patient education provided. Access to services provided for patient through [facility] program.

## 2016-08-26 NOTE — Patient Instructions (Addendum)
Explained breast self awareness with [place]. Let patient know that follow up from today's Pap smear will be based on the result. Informed patient that she will need a screening mammogram at age 31 unless clinically indicated prior. Let patient know will follow up with her within the next couple weeks with results of Pap smear by phone. Discussed smoking cessation with patient. Referred patient to the [facility] Quitline and gave resources to the free smoking cessation classes offered through [facility]. [name] Banker verbalized understanding.  Callen Vancuren, Arvil Chaco, RN 2:38 PM

## 2016-08-27 ENCOUNTER — Encounter (HOSPITAL_COMMUNITY): Payer: Self-pay | Admitting: *Deleted

## 2016-08-30 LAB — CYTOLOGY - PAP: HPV: DETECTED — AB

## 2016-09-01 ENCOUNTER — Telehealth (HOSPITAL_COMMUNITY): Payer: Self-pay | Admitting: *Deleted

## 2016-09-01 ENCOUNTER — Other Ambulatory Visit (HOSPITAL_COMMUNITY)
Admission: RE | Admit: 2016-09-01 | Discharge: 2016-09-01 | Disposition: A | Discharge status: Home / Self Care | Payer: Medicaid Other | Source: Ambulatory Visit | Attending: Obstetrics and Gynecology | Admitting: Obstetrics and Gynecology

## 2016-09-01 ENCOUNTER — Ambulatory Visit (INDEPENDENT_AMBULATORY_CARE_PROVIDER_SITE_OTHER): Payer: Self-pay | Admitting: Obstetrics and Gynecology

## 2016-09-01 ENCOUNTER — Encounter: Payer: Self-pay | Admitting: Obstetrics and Gynecology

## 2016-09-01 DIAGNOSIS — N889 Noninflammatory disorder of cervix uteri, unspecified: Secondary | ICD-10-CM | POA: Insufficient documentation

## 2016-09-01 DIAGNOSIS — C801 Malignant (primary) neoplasm, unspecified: Secondary | ICD-10-CM | POA: Insufficient documentation

## 2016-09-01 DIAGNOSIS — N915 Oligomenorrhea, unspecified: Secondary | ICD-10-CM

## 2016-09-01 DIAGNOSIS — Z3202 Encounter for pregnancy test, result negative: Secondary | ICD-10-CM

## 2016-09-01 DIAGNOSIS — C539 Malignant neoplasm of cervix uteri, unspecified: Secondary | ICD-10-CM | POA: Diagnosis not present

## 2016-09-01 DIAGNOSIS — R87619 Unspecified abnormal cytological findings in specimens from cervix uteri: Secondary | ICD-10-CM

## 2016-09-01 LAB — POCT PREGNANCY, URINE: Preg Test, Ur: NEGATIVE

## 2016-09-01 NOTE — Procedures (Addendum)
Colposcopy Procedure Note  Pre-operative Diagnosis: AGUS, HPV pap smear. Tobacco abuse  Post-operative Diagnosis: Same. Cervical mass  History  Patient unsure of when last pap smear was (?10 years). G0. Period q2-56m. Currently in same sex relationship. She had a 2/1 CT stone done and no RP LAD noted then and normal uterus and adnexa with normal Cr, mild anemia at HCT 35. Urine did show some blood and she had +nitrites and was put on abx.   Procedure Details  LMP Jan 2018; UPT negative. Patient endorses tobacco usage.    The risks (including infection, bleeding, pain) and benefits of the procedure were explained to the patient and written informed consent was obtained.  The patient was placed in the dorsal lithotomy position. A Graves was speculum inserted in the vagina, and the cervix was visualized. Three biopsies done with tischler forceps from cervical mass and sent to pathology. ECC or EMBx not done due to inability to distinguish external os. Monsel's applied to area. EBL 57mL  Findings: 3-4 x 3cm cervical mass that was very friable and erythematous and bled easily after biopsy. This encompassed the entire distal end of the cervix and inability to see external os. No obvious sidewall involvement.   Specimens: three cervical mass biopsies to pathology  Condition: Stable  Complications: None  Plan: The patient was advised to call for any fever or for prolonged or severe pain or bleeding. She was advised to use OTC analgesics as needed for mild to moderate pain. She was advised to avoid vaginal intercourse for 48 hours or until the bleeding has completely stopped.   D/w pt that this most likely represents a malignancy and she confirms [phone] as her best phone number. Importance of follow up d/w her and her partner. Follow up re: oligomenorrhea after biopsy results come back.    Durene Romans MD Attending Center for [employer] Fish farm manager)

## 2016-09-01 NOTE — Telephone Encounter (Signed)
Telephoned patient at home number and advised patient pap smear results were abnormal with +HPV. Advised patient would need to have colposcopy. Patient's appointment is at [facility] March 7 2:40. Patient voiced understanding.

## 2016-09-01 NOTE — Progress Notes (Signed)
colpo

## 2016-09-02 DIAGNOSIS — N915 Oligomenorrhea, unspecified: Secondary | ICD-10-CM | POA: Insufficient documentation

## 2016-09-06 ENCOUNTER — Telehealth: Payer: Self-pay | Admitting: Obstetrics and Gynecology

## 2016-09-06 ENCOUNTER — Encounter: Payer: Self-pay | Admitting: Obstetrics and Gynecology

## 2016-09-06 ENCOUNTER — Telehealth: Payer: Self-pay

## 2016-09-06 DIAGNOSIS — C801 Malignant (primary) neoplasm, unspecified: Secondary | ICD-10-CM

## 2016-09-06 NOTE — Telephone Encounter (Signed)
Pt notified of new pt appointment with dr Fermin Schwab on 09/10/16 at 11:15

## 2016-09-06 NOTE — Telephone Encounter (Signed)
GYN Telephone Note Patient called at [phone] and informed of cancer diagnosis. Request sent to clinic for ASAP GYN Onc consult.  No pain and only minimal discharge from the Monsel's.   Durene Romans MD Attending Center for [employer] (Faculty Practice) 09/06/2016 Time: 1105am

## 2016-09-10 ENCOUNTER — Ambulatory Visit: Payer: Medicaid Other | Attending: Gynecology | Admitting: Gynecology

## 2016-09-10 ENCOUNTER — Encounter: Payer: Self-pay | Admitting: Gynecology

## 2016-09-10 DIAGNOSIS — F17210 Nicotine dependence, cigarettes, uncomplicated: Secondary | ICD-10-CM | POA: Diagnosis not present

## 2016-09-10 DIAGNOSIS — Z880 Allergy status to penicillin: Secondary | ICD-10-CM | POA: Insufficient documentation

## 2016-09-10 DIAGNOSIS — C539 Malignant neoplasm of cervix uteri, unspecified: Secondary | ICD-10-CM | POA: Diagnosis not present

## 2016-09-10 NOTE — Patient Instructions (Signed)
Dr Fermin Schwab has ordered a pelvic CT scan for you.  Once pre authorization has been obtained from your insurance we will contact you with an appointment  We are providing you with the oral contrast that will be needed for your CT Scan, please follow the attached directions once your test is booked.

## 2016-09-10 NOTE — Progress Notes (Signed)
Consult Note: Gyn-Onc   Joanne Griffin 31 y.o. female  Chief Complaint  Patient presents with  . 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  Plan: Before making treatment decisions I recommend the patient undergo a CT scan of the abdomen and pelvis with contrast. Certainly given her pelvic symptoms there may be a more extensive disease beyond the cervix. Following review the CT scan will make further recommendations as to whether surgery or radiation therapy would be most appropriate. Given clinic scheduling, will have patient see Dr. Denman George to make final treatment recommendations. This information was shared with the patient her mother and father and her aunt.  HPI: 46 year old white single female seen in consultation at the request of Dr. Aletha Halim and Dr.Carolyn Harraway-Smith regarding management of a newly diagnosed adenocarcinoma of the cervix. The patient reports she is not had Pap smears in the "many years". She presented initially with flank pain to the emergency room on 07/29/2016. She was treated for a UTI and had resolution of her flank pain although she continued to have suprapubic pain. She has had irregular bleeding and a copious discharge for several months. Ultimately she had a Pap smear showing atypical glandular cells and a cervical biopsy on 09/01/2016 revealing invasive adenocarcinoma of the cervix. The patient denies any weight loss or other constitutional symptoms.  Review of Systems:10 point review of systems is negative except as noted in interval history.   Vitals: There were no vitals taken for this visit.  Physical Exam: General : The patient is a healthy woman in no acute distress.  HEENT: normocephalic, extraoccular movements normal; neck is supple without thyromegally  Lynphnodes: There is one 1 cm left inguinal lymph node. Abdomen: Soft, non-tender, no ascites, no organomegally, no masses, no hernias  Pelvic:  EGBUS:  Normal female  Vagina: Normal, no lesions  Urethra and Bladder: Normal, non-tender  Cervix: Cervix is replaced by an exophytic nodular lesion which is slightly friable measuring approximately 4 cm in diameter. The lower uterine segment also feels "bulky" Uterus: Difficult to fully outline because of pain on palpation Bi-manual examination: Non-tender; no adenxal masses or nodularity, I do not detect any parametrial involvement although the lower uterine segment feels slightly enlarged.  Rectal: normal sphincter tone, no masses, no blood  Lower extremities: No edema or varicosities. Normal range of motion      Allergies  Allergen Reactions  . Penicillins Other (See Comments)    CHILDHOOD    Past Medical History:  Diagnosis Date  . 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 Cyst, dermoid, arm     History reviewed. No pertinent surgical history.  No current outpatient prescriptions on file.   No current facility-administered medications for this visit.     Social History   Social History  . Marital status: Single    Spouse name: N/A  . Number of children: N/A  . Years of education: N/A   Occupational History  . Not on file.   Social History Main Topics  . Smoking status: Current Every Day Smoker    Packs/day: 1.00    Years: 13.00  . Smokeless tobacco: Never Used  . Alcohol use No     Comment: Quit drinking 8 months ago  . Drug use: No  . Sexual activity: Yes    Birth control/ protection: None   Other Topics Concern  . Not on file   Social History Narrative  . No narrative on file  Family History  Problem Relation Age of Onset  . Diabetes Maternal Grandmother   . Breast cancer Paternal Grandmother       [name], MD 09/10/2016, 12:14 PM

## 2016-09-15 ENCOUNTER — Telehealth (HOSPITAL_COMMUNITY): Payer: Self-pay | Admitting: *Deleted

## 2016-09-15 NOTE — Telephone Encounter (Signed)
Telephoned patient at home number and left message to return call to [facility] 

## 2016-09-27 ENCOUNTER — Encounter: Payer: Self-pay | Admitting: Gynecology

## 2016-09-29 ENCOUNTER — Telehealth: Payer: Self-pay

## 2016-09-29 ENCOUNTER — Encounter: Payer: Self-pay | Admitting: Gynecology

## 2016-09-29 NOTE — Telephone Encounter (Signed)
Pt informed that pre Josem Kaufmann was obtained for CT scan and she was given the number to call to schedule.

## 2016-10-05 ENCOUNTER — Ambulatory Visit (HOSPITAL_COMMUNITY)
Admission: RE | Admit: 2016-10-05 | Discharge: 2016-10-05 | Disposition: A | Discharge status: Home / Self Care | Payer: Medicaid Other | Source: Ambulatory Visit | Attending: Gynecology | Admitting: Gynecology

## 2016-10-05 ENCOUNTER — Encounter (HOSPITAL_COMMUNITY): Payer: Self-pay

## 2016-10-05 DIAGNOSIS — R590 Localized enlarged lymph nodes: Secondary | ICD-10-CM | POA: Diagnosis not present

## 2016-10-05 DIAGNOSIS — C539 Malignant neoplasm of cervix uteri, unspecified: Secondary | ICD-10-CM | POA: Diagnosis present

## 2016-10-05 MED ORDER — IOPAMIDOL (ISOVUE-300) INJECTION 61%
INTRAVENOUS | Status: AC
  Administered 2016-10-05: 100 mL
  Filled 2016-10-05: qty 100

## 2016-10-10 ENCOUNTER — Telehealth: Payer: Self-pay | Admitting: Gynecology

## 2016-10-10 NOTE — Telephone Encounter (Signed)
Patient informed of CT report.  The cervix is large and there are suspicious lymphnodes.  I feel she will be best treated with radiation and cisplatin chemotherapy.  The treatment plan was discussed with the patient.  Drs. Kinard and Alvy Bimler have been contacted through [employer] and have been asked to contact the patient and schedule initial evaluations.  I will plan on seeing the patient back in gyn oncology clinic once her treatments are completed.

## 2016-10-12 ENCOUNTER — Encounter: Payer: Self-pay | Admitting: Radiation Oncology

## 2016-10-12 ENCOUNTER — Encounter: Payer: Self-pay | Admitting: Gynecology

## 2016-10-13 ENCOUNTER — Telehealth: Payer: Self-pay | Admitting: *Deleted

## 2016-10-13 NOTE — Telephone Encounter (Signed)
I have scheduled appt for the patient to see Dr. Alvy Bimler. I have called and left the patient a message with the date/time

## 2016-10-15 ENCOUNTER — Encounter: Payer: Self-pay | Admitting: Radiation Oncology

## 2016-10-15 NOTE — Progress Notes (Signed)
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 Mora Bellman note "with flank pain to the emergency room on 07/29/2016. She was treated for a UTI and had resolution of her flank pain although she continued to have suprapubic pain. She has had irregular bleeding and a copious discharge for several months. Ultimately she had a Pap smear showing atypical glandular cells and a cervical biopsy on 09/01/2016 revealing invasive adenocarcinoma of the cervix."   Biopsies revealed:   09/01/16 Diagnosis Cervix, biopsy - INVASIVE ADENOCARCINOMA, SEE COMMENT.  Past/Anticipated interventions by Gyn/Onc surgery, if any: Per Dr. Fermin Schwab, patient would be best treated with radiation and cisplatin chemotherapy.  Past/Anticipated interventions by medical oncology, if any: will see Dr. Alvy Bimler on 10/20/16.  Weight changes, if any: reports having a poor appetite.  She is unsure of any weight loss.  Bowel/Bladder complaints, if any: reports having trouble starting her urinary stream, has dysuria.  She reports having constipation with her last bowel movement 2 days ago.  Nausea/Vomiting, if any: yes  Pain issues, if any:  Yes - rating at an 8/10 today.  Reports it started in November.  She said it is constant in her pelvis area radiation down her inner thighs and lower back.  She is not taking any pain medication.  SAFETY ISSUES:  Prior radiation? no  Pacemaker/ICD? no  Possible current pregnancy? no  Is the patient on methotrexate? no  Current Complaints / other details:  Patient is here with her mother and aunt.  BP 116/78 (BP Location: Right Arm, Patient Position: Sitting)   Pulse 92   Ht 5\' 2"  (1.575 m)   Wt 147 lb (66.7 kg)   SpO2 100%   BMI 26.89 kg/m    Wt Readings from Last 3 Encounters:  10/18/16 147 lb (66.7 kg)  09/01/16 151 lb (68.5 kg)  08/26/16 148 lb 12.8 oz (67.5 kg)

## 2016-10-18 ENCOUNTER — Ambulatory Visit: Admission: RE | Admit: 2016-10-18 | Payer: Medicaid Other | Source: Ambulatory Visit

## 2016-10-18 ENCOUNTER — Ambulatory Visit
Admission: RE | Admit: 2016-10-18 | Discharge: 2016-10-18 | Disposition: A | Discharge status: Home / Self Care | Payer: Medicaid Other | Source: Ambulatory Visit | Attending: Radiation Oncology | Admitting: Radiation Oncology

## 2016-10-18 ENCOUNTER — Encounter: Payer: Self-pay | Admitting: Radiation Oncology

## 2016-10-18 VITALS — BP 116/78 | HR 92 | Ht 62.0 in | Wt 147.0 lb

## 2016-10-18 DIAGNOSIS — Z510 Encounter for antineoplastic radiation therapy: Secondary | ICD-10-CM | POA: Diagnosis present

## 2016-10-18 DIAGNOSIS — R590 Localized enlarged lymph nodes: Secondary | ICD-10-CM | POA: Insufficient documentation

## 2016-10-18 DIAGNOSIS — Z880 Allergy status to penicillin: Secondary | ICD-10-CM | POA: Insufficient documentation

## 2016-10-18 DIAGNOSIS — C530 Malignant neoplasm of endocervix: Secondary | ICD-10-CM | POA: Diagnosis not present

## 2016-10-18 DIAGNOSIS — F17210 Nicotine dependence, cigarettes, uncomplicated: Secondary | ICD-10-CM | POA: Diagnosis not present

## 2016-10-18 DIAGNOSIS — C801 Malignant (primary) neoplasm, unspecified: Secondary | ICD-10-CM

## 2016-10-18 HISTORY — DX: Malignant neoplasm of cervix uteri, unspecified: C53.9

## 2016-10-18 MED ORDER — OXYCODONE-ACETAMINOPHEN 5-325 MG PO TABS: 1.0000 | ORAL_TABLET | ORAL | 0 refills | Status: DC | PRN

## 2016-10-18 MED ORDER — LORAZEPAM 0.5 MG PO TABS: 0.5000 mg | ORAL_TABLET | Freq: Three times a day (TID) | ORAL | 0 refills | Status: DC

## 2016-10-18 NOTE — Progress Notes (Signed)
Radiation Oncology         ([phone]) [phone] ________________________________  Initial Outpatient Consultation  Name: Joanne Griffin MRN: [id number]  Date: 10/18/2016  DOB: 1986-04-26  CC:No PCP Per Patient  [name],*   REFERRING PHYSICIAN: [name]  DIAGNOSIS: 31 year-old with invasive adenocarcinoma of the cervix, clinical stage IB2 adenocarcinoma cervix with radiographically suspicious pelvic lymphadenopathy    HISTORY OF PRESENT ILLNESS::Joanne Griffin is a 31 y.o. female who presented to the ER on 07/29/16 complaining of flank pain. She was treated for a UTI and had resolution of the flank pain although, she continued to have suprapubic pain. She has had irregular bleeding and a copious discharge for several months. Ultimately she had a Pap smear showing atypical glandular cells and a cervical biopsy on 09/01/16 revealed invasive adenocarcinoma.     The patient is scheduled for consultation with medical oncologist Dr. Alvy Bimler this Wednesday, 10/20/16.   The patient is here to discuss radiation treatment options.  PREVIOUS RADIATION THERAPY: No  PAST MEDICAL HISTORY:  has a past medical history of Adenocarcinoma (GYN origin) (09/01/2016); Cervical cancer ([place]); and Cyst, dermoid, arm.    PAST SURGICAL HISTORY:History reviewed. No pertinent surgical history.  FAMILY HISTORY: family history includes Breast cancer in her paternal grandmother; Diabetes in her maternal grandmother.  SOCIAL HISTORY:  reports that she has been smoking.  She has a 13.00 pack-year smoking history. She has never used smokeless tobacco. She reports that she does not drink alcohol or use drugs.  ALLERGIES: Penicillins  MEDICATIONS:  No current outpatient prescriptions on file.   No current facility-administered medications for this encounter.     REVIEW OF SYSTEMS:  On review of systems, the patient reports that she is doing well overall. She is accompanied by her mother and aunt.  She denies any chest pain, shortness of breath, cough, fevers, chills, night sweats. She reports about 5 lbs weight loss. She does report loss of appetite. She denies any abdominal pain. She reports having trouble starting her urinary stream and dysuria. She reports having constipation with her last bowel movement 2 days ago. She reports nausea/vomiting. She reports constant pain in her pelvis area radiating down her inner thighs and lower back. This pain rates at an 8/10 today on pain scale. She is not taking any pain medication. She reports occasional headaches localized toward the back of the head. She also reports a swollen lymph node on her neck. She denies sore throat or difficulty swallowing. She denies soreness of the mouth or gums. 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 She has not been able to work recently. A complete review of systems is obtained and is otherwise negative.   PHYSICAL EXAM:  height is 5\' 2"  (1.575 m) and weight is 147 lb (66.7 kg). Her blood pressure is 116/78 and her pulse is 92. Her oxygen saturation is 100%.   General: Alert and oriented, in no acute distress, accompanied by aunt and mother on evaluation today HEENT: Head is normocephalic. Extraocular movements are intact. Oropharynx is clear. Neck: Neck is supple. 1.5 cm lymph node in the left posterior triangle of the neck, mildly tender with palpation. Heart: Regular in rate and rhythm with no murmurs, rubs, or gallops. Chest: Clear to auscultation bilaterally, with no rhonchi, wheezes, or rales.  Abdomen: Soft, nontender, nondistended, with no rigidity or guarding. Extremities: No cyanosis or edema. Lymphatics: see Neck Exam Skin: No concerning lesions. Musculoskeletal: symmetric strength and muscle tone throughout. Neurologic: Cranial  nerves II through XII are grossly intact. No obvious focalities. Speech is fluent. Coordination is intact. Psychiatric: Judgment and insight are intact. Affect is  appropriate. EGBUS: Normal female Vagina: Normal, no lesions Cervix: Cervix is replaced by an exophytic nodular lesion which is slightly friable measuring approximately 4 cm in diameter. The lesion bleeds easily with exam. Bi-manual examination:    No obvious parametrial involvement  Rectal: normal sphincter tone, no masses, no blood, confirms bimanual exam  ECOG = 1  0 - Asymptomatic (Fully active, able to carry on all predisease activities without restriction)  1 - Symptomatic but completely ambulatory (Restricted in physically strenuous activity but ambulatory and able to carry out work of a light or sedentary nature. For example, light housework, office work)  2 - Symptomatic, <50% in bed during the day (Ambulatory and capable of all self care but unable to carry out any work activities. Up and about more than 50% of waking hours)  3 - Symptomatic, >50% in bed, but not bedbound (Capable of only limited self-care, confined to bed or chair 50% or more of waking hours)  4 - Bedbound (Completely disabled. Cannot carry on any self-care. Totally confined to bed or chair)  5 - Death   Eustace Pen MM, Creech RH, Tormey DC, et al. [phone]). "Toxicity and response criteria of the [facility]". [place] Oncol. 5 (6): 649-55  LABORATORY DATA:  Lab Results  Component Value Date   WBC 10.5 07/29/2016   HGB 11.5 (L) 07/29/2016   HCT 34.8 (L) 07/29/2016   MCV 85.1 07/29/2016   PLT 399 07/29/2016   NEUTROABS 6.9 07/29/2016   Lab Results  Component Value Date   NA 136 07/29/2016   K 3.5 07/29/2016   CL 103 07/29/2016   CO2 22 07/29/2016   GLUCOSE 99 07/29/2016   CREATININE 0.74 07/29/2016   CALCIUM 8.9 07/29/2016      RADIOGRAPHY: Ct Abdomen Pelvis W Contrast  Result Date: 10/05/2016 CLINICAL DATA:  Newly diagnosed cervical carcinoma. Lower abdominal pain and nausea. EXAM: CT ABDOMEN AND PELVIS WITH CONTRAST TECHNIQUE: Multidetector CT imaging of the abdomen and  pelvis was performed using the standard protocol following bolus administration of intravenous contrast. CONTRAST:  171mL ISOVUE-300 IOPAMIDOL (ISOVUE-300) INJECTION 61% COMPARISON:  Noncontrast CT on 07/29/2016 FINDINGS: Lower Chest: No acute findings. Hepatobiliary:  No masses identified. Gallbladder is unremarkable. Pancreas:  No mass or inflammatory changes. Spleen: Within normal limits in size and appearance. Adrenals/Urinary Tract: No masses identified. No evidence of hydronephrosis. Stomach/Bowel: No evidence of obstruction, inflammatory process or abnormal fluid collections. Vascular/Lymphatic: Mild retroperitoneal lymphadenopathy is seen in the inferior aorto-caval space and the right common iliac chain, with largest lymph node measuring 10 mm on image 55/2. No upper abdominal lymphadenopathy identified. No abdominal aortic aneurysm. Reproductive: A heterogeneously enhancing mass is seen in the cervix and lower uterine segment which measures 4.3 by 4.7 x 2.6 cm on image 73/ 2. This is consistent with known cervical carcinoma. A 3.9 cm simple right ovarian follicular cyst is seen which is new since recent exam. No evidence of ascites. Other:  None. Musculoskeletal:  No suspicious bone lesions identified. IMPRESSION: 4.7 cm heterogeneous enhancing mass in the cervix and lower uterine segment, consistent with known cervical carcinoma. Mild retroperitoneal lymphadenopathy in the inferior aortocaval space and right common iliac chain, suspicious for metastatic disease. Electronically Signed   By: Earle Gell M.D.   On: 10/05/2016 10:44      IMPRESSION: clinical stage IB2  adenocarcinoma cervix with radiographically suspicious pelvic lymphadenopathy. Exam today is also concerning for a palpable lymph node in the left posterior triangle of the neck   PLAN: Today, I talked to the patient and family about the findings and work-up thus far.  We discussed the natural history of invasive adenocarcinoma of the  cervix and general treatment, highlighting the role of radiotherapy in the management.  We discussed the available radiation techniques, and focused on the details of logistics and delivery.  We reviewed the anticipated acute and late sequelae associated with radiation in this setting.  The patient was encouraged to ask questions that I answered to the best of my ability. The patient would like to proceed with radiation and will be scheduled for CT simulation. We discussed with the patient would best chances for cure would be a combination of external beam along with radiosensitizing chemotherapy as well as intracavitary brachytherapy treatments. Patient will be scheduled for a PET scan to further evaluate her suspicious lymphadenopathy in the upper pelvis and to evaluate the palpable lymph node in the left neck.  Patient will also be seen by medical oncology later this week.     ------------------------------------------------  Blair Promise, PhD, MD   This document serves as a record of services personally performed by Gery Pray, MD. It was created on his behalf by Arlyce Harman, a trained medical scribe. The creation of this record is based on the scribe's personal observations and the provider's statements to them. This document has been checked and approved by the attending provider.

## 2016-10-20 ENCOUNTER — Encounter: Payer: Self-pay | Admitting: Hematology and Oncology

## 2016-10-20 ENCOUNTER — Ambulatory Visit (HOSPITAL_BASED_OUTPATIENT_CLINIC_OR_DEPARTMENT_OTHER): Payer: Medicaid Other | Admitting: Hematology and Oncology

## 2016-10-20 ENCOUNTER — Telehealth: Payer: Self-pay | Admitting: *Deleted

## 2016-10-20 ENCOUNTER — Telehealth: Payer: Self-pay | Admitting: Hematology and Oncology

## 2016-10-20 VITALS — BP 142/91 | HR 97 | Temp 98.0°F | Resp 18 | Ht 62.0 in | Wt 143.9 lb

## 2016-10-20 DIAGNOSIS — K5909 Other constipation: Secondary | ICD-10-CM | POA: Diagnosis not present

## 2016-10-20 DIAGNOSIS — Z720 Tobacco use: Secondary | ICD-10-CM | POA: Diagnosis not present

## 2016-10-20 DIAGNOSIS — C539 Malignant neoplasm of cervix uteri, unspecified: Secondary | ICD-10-CM

## 2016-10-20 DIAGNOSIS — F418 Other specified anxiety disorders: Secondary | ICD-10-CM

## 2016-10-20 DIAGNOSIS — C530 Malignant neoplasm of endocervix: Secondary | ICD-10-CM

## 2016-10-20 MED ORDER — BUPROPION HCL 100 MG PO TABS: 100.0000 mg | ORAL_TABLET | Freq: Two times a day (BID) | ORAL | 1 refills | Status: DC

## 2016-10-20 NOTE — Telephone Encounter (Signed)
Called patient to inform of Pet Scan for 11-01-16 - arrival time - 10:30 am @ [facility], pt. To be npo - 6 hrs. Prior to test, spoke with patient and she is aware of this scan

## 2016-10-20 NOTE — Progress Notes (Signed)
START OFF PATHWAY REGIMEN - [Other Dx]   [id number]:Cisplatin 40 mg/m2 weekly (4 weeks per order sheet):   Administer weekly:     Cisplatin   **Always confirm dose/schedule in your pharmacy ordering system**    Intent of Therapy: Curative Intent, Discussed with Patient

## 2016-10-20 NOTE — Telephone Encounter (Signed)
Gave patient AVS and calender for scheduled appts per 4/25 los. - unable to scheduled treatments due to availability in the treatment area - patient will come in and pick up a new schedule when she comes in for chemo edu 5/2

## 2016-10-21 ENCOUNTER — Encounter: Payer: Self-pay | Admitting: *Deleted

## 2016-10-21 ENCOUNTER — Telehealth: Payer: Self-pay | Admitting: Hematology and Oncology

## 2016-10-21 DIAGNOSIS — F418 Other specified anxiety disorders: Secondary | ICD-10-CM | POA: Insufficient documentation

## 2016-10-21 DIAGNOSIS — K5909 Other constipation: Secondary | ICD-10-CM | POA: Insufficient documentation

## 2016-10-21 DIAGNOSIS — Z720 Tobacco use: Secondary | ICD-10-CM | POA: Insufficient documentation

## 2016-10-21 NOTE — Assessment & Plan Note (Signed)
She has signs and symptoms of anxiety and depression She is not suicidal I recommend we try Wellbutrin.

## 2016-10-21 NOTE — Telephone Encounter (Signed)
Scheduled appts per 4/25 los - per Dr. Alvy Bimler week of 5/29 treatment if that day is unavailable in the treatment area another day is okay as long as the following weeks for treatment coincide .

## 2016-10-21 NOTE — Assessment & Plan Note (Signed)
I have a long discussion with the patient and family members. 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 We discussed fertility preservation but the patient declined I will schedule chemo education class, port placement and to start her on treatment within the next 2 weeks I will see her back before we start treatment for chemotherapy consent and pain management

## 2016-10-21 NOTE — Progress Notes (Signed)
[name] CONSULT NOTE  Patient Care Team: No Pcp Per Patient as PCP - General (General Practice)  CHIEF COMPLAINTS/PURPOSE OF CONSULTATION:  Newly diagnosed cervical cancer  HISTORY OF PRESENTING ILLNESS:  Joanne Griffin 31 y.o. female is here because of recent diagnosis of cervical cancer. I reviewed her records extensively and collaborated the history with the patient The patient typically has irregular periods Recently, she had significant, heavy vaginal bleeding.  She subsequently underwent further evaluation as summarized below:   Adenocarcinoma (GYN origin)   09/01/2016 Initial Diagnosis    Adenocarcinoma (GYN origin)      Cervical cancer ([place])   08/26/2016 Pathology Results    Adequacy Reason Satisfactory for evaluation, endocervical/transformation zone component PRESENT. Diagnosis ATYPICAL GLANDULAR CELLS. SEE COMMENT. COMMENT: THERE ARE GROUPS OF ATYPICAL GLANDULAR CELLS WHICH APPEAR TO BE OF ENDOCERVICAL ORIGIN. TISSUE STUDIES, SUCH AS A CURETTAGE, MAY HELP BETTER EVALUATE THE EXTENT AND SEVERITY OF THESE ATYPICAL CELLS.      09/01/2016 Initial Diagnosis    The patient reports she is not had Pap smears in the "many years". She presented initially with flank pain to the emergency room on 07/29/2016. She was treated for a UTI and had resolution of her flank pain although she continued to have suprapubic pain. She has had irregular bleeding and a copious discharge for several months. 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 Microscopic Comment Immunohistochemistry is attempted to determine endocervical vs. endometrial origin. CEA is focally positive, p16 is diffusely strongly positive, ER is weakly positive, and vimentin is positive. This profile is not specific as  to origin and clinical correlation is recommended. Dr. Avis Epley has reviewed the case. The case was called to Dr. Ilda Basset on 09/06/2016      10/05/2016 Imaging    Ct abdomen: 4.7 cm heterogeneous enhancing mass in the cervix and lower uterine segment, consistent with known cervical carcinoma. Mild retroperitoneal lymphadenopathy in the inferior aortocaval space and right common iliac chain, suspicious for metastatic disease.      Apart from heavy bleeding, she also have some mild, intermittent cramps and pelvic pain.   The pain started in her pelvis, feels like a menstrual cramp, radiating down bilaterally to the inner part of her legs She is prescribed pain medicine She has baseline chronic constipation, worse recently.  She only had bowel movement every other day She had some mild nausea but no vomiting Currently, the patient is not working and lives with her family She have smoking history, 1 pack of cigarettes per day since age 78, is interested to quit She does not have children. Since diagnosis, she has significant problem with anxiety and depression She is not suicidal Her appetite is stable.  She has some recent weight loss due to anxiety from recent diagnosis  MEDICAL HISTORY:  Past Medical History:  Diagnosis Date  . 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 Anxiety   . Cervical cancer ([place])   . Cyst, dermoid, arm   . Depression     SURGICAL HISTORY: Past Surgical History:  Procedure Laterality Date  . CYST REMOVAL HAND      SOCIAL HISTORY: Social History   Social History  . Marital status: Single    Spouse name: N/A  . Number of children: 0  . Years of education: N/A  Occupational History  . Not on file.   Social History Main Topics  . Smoking status: Current Every Day Smoker    Packs/day: 1.00    Years: 13.00  . Smokeless tobacco: Never Used     Comment: less than a pack per day now  . Alcohol use No     Comment: Quit drinking 8 months ago  . Drug use:  No  . Sexual activity: Yes    Birth control/ protection: None   Other Topics Concern  . Not on file   Social History Narrative  . No narrative on file    FAMILY HISTORY: Family History  Problem Relation Age of Onset  . Diabetes Maternal Grandmother   . Breast cancer Paternal Grandmother     ALLERGIES:  is allergic to penicillins.  MEDICATIONS:  Current Outpatient Prescriptions  Medication Sig Dispense Refill  . LORazepam (ATIVAN) 0.5 MG tablet Take 1 tablet (0.5 mg total) by mouth every 8 (eight) hours. As Needed for anxiety or nausea 30 tablet 0  . oxyCODONE-acetaminophen (PERCOCET/ROXICET) 5-325 MG tablet Take 1 tablet by mouth every 4 (four) hours as needed for severe pain. 30 tablet 0  . buPROPion (WELLBUTRIN) 100 MG tablet Take 1 tablet (100 mg total) by mouth 2 (two) times daily. 60 tablet 1   No current facility-administered medications for this visit.     REVIEW OF SYSTEMS:   Constitutional: Denies fevers, chills or abnormal night sweats Eyes: Denies blurriness of vision, double vision or watery eyes Ears, nose, mouth, throat, and face: Denies mucositis or sore throat Respiratory: Denies cough, dyspnea or wheezes Cardiovascular: Denies palpitation, chest discomfort or lower extremity swelling Skin: Denies abnormal skin rashes Lymphatics: Denies new lymphadenopathy or easy bruising Neurological:Denies numbness, tingling or new weaknesses Behavioral/Psych: Mood is stable, no new changes  All other systems were reviewed with the patient and are negative.  PHYSICAL EXAMINATION: ECOG PERFORMANCE STATUS: 1 - Symptomatic but completely ambulatory  Vitals:   10/20/16 1314  BP: (!) 142/91  Pulse: 97  Resp: 18  Temp: 98 F (36.7 C)   Filed Weights   10/20/16 1314  Weight: 143 lb 14.4 oz (65.3 kg)    GENERAL:alert, no distress and comfortable.  She appears to have poor personal hygiene SKIN: skin color, texture, turgor are normal, no rashes or significant  lesions EYES: normal, conjunctiva are pink and non-injected, sclera clear OROPHARYNX:no exudate, no erythema and lips, buccal mucosa, and tongue normal  NECK: supple, thyroid normal size, non-tender, without nodularity LYMPH:  no palpable lymphadenopathy in the cervical, axillary or inguinal LUNGS: clear to auscultation and percussion with normal breathing effort HEART: regular rate & rhythm and no murmurs and no lower extremity edema ABDOMEN:abdomen soft, non-tender and normal bowel sounds Musculoskeletal:no cyanosis of digits and no clubbing  PSYCH: alert & oriented x 3 with fluent speech NEURO: no focal motor/sensory deficits  LABORATORY DATA:  I have reviewed the data as listed Lab Results  Component Value Date   WBC 10.5 07/29/2016   HGB 11.5 (L) 07/29/2016   HCT 34.8 (L) 07/29/2016   MCV 85.1 07/29/2016   PLT 399 07/29/2016    Recent Labs  07/29/16 1349  NA 136  K 3.5  CL 103  CO2 22  GLUCOSE 99  BUN 6  CREATININE 0.74  CALCIUM 8.9  GFRNONAA >60  GFRAA >60  PROT 6.6  ALBUMIN 3.5  AST 15  ALT 16  ALKPHOS 49  BILITOT 0.5    RADIOGRAPHIC STUDIES: I have  personally reviewed the radiological images as listed and agreed with the findings in the report. Ct Abdomen Pelvis W Contrast  Result Date: 10/05/2016 CLINICAL DATA:  Newly diagnosed cervical carcinoma. Lower abdominal pain and nausea. EXAM: CT ABDOMEN AND PELVIS WITH CONTRAST TECHNIQUE: Multidetector CT imaging of the abdomen and pelvis was performed using the standard protocol following bolus administration of intravenous contrast. CONTRAST:  131mL ISOVUE-300 IOPAMIDOL (ISOVUE-300) INJECTION 61% COMPARISON:  Noncontrast CT on 07/29/2016 FINDINGS: Lower Chest: No acute findings. Hepatobiliary:  No masses identified. Gallbladder is unremarkable. Pancreas:  No mass or inflammatory changes. Spleen: Within normal limits in size and appearance. Adrenals/Urinary Tract: No masses identified. No evidence of hydronephrosis.  Stomach/Bowel: No evidence of obstruction, inflammatory process or abnormal fluid collections. Vascular/Lymphatic: Mild retroperitoneal lymphadenopathy is seen in the inferior aorto-caval space and the right common iliac chain, with largest lymph node measuring 10 mm on image 55/2. No upper abdominal lymphadenopathy identified. No abdominal aortic aneurysm. Reproductive: A heterogeneously enhancing mass is seen in the cervix and lower uterine segment which measures 4.3 by 4.7 x 2.6 cm on image 73/ 2. This is consistent with known cervical carcinoma. A 3.9 cm simple right ovarian follicular cyst is seen which is new since recent exam. No evidence of ascites. Other:  None. Musculoskeletal:  No suspicious bone lesions identified. IMPRESSION: 4.7 cm heterogeneous enhancing mass in the cervix and lower uterine segment, consistent with known cervical carcinoma. Mild retroperitoneal lymphadenopathy in the inferior aortocaval space and right common iliac chain, suspicious for metastatic disease. Electronically Signed   By: Earle Gell M.D.   On: 10/05/2016 10:44    ASSESSMENT & PLAN:  Cervical cancer ([employer]) I have a long discussion with the patient and family members. 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 We discussed fertility preservation but the patient declined I will schedule chemo education class, port placement and to start her on treatment within the next 2 weeks I will see her back before we start treatment for chemotherapy consent and pain management  Tobacco abuse I spent some time counseling the patient the importance of tobacco cessation. We discussed common strategies including nicotine patches, Tobacco  Quit-line, and other nicotine replacement products to assist in hereffort to quit  she appears motivated to quit. Due to ongoing depression, I recommend we try Wellbutrin.  Depression with anxiety She has signs and symptoms of anxiety and depression She is not suicidal I recommend we try Wellbutrin.   Other constipation The patient had recent constipation, likely due to pain and her disease We discussed the importance of laxative therapy  Orders Placed This Encounter  Procedures  . IR FLUORO GUIDE PORT INSERTION RIGHT    Standing Status:   Future    Standing Expiration Date:   12/20/2017    Order Specific Question:   Reason for Exam (SYMPTOM  OR DIAGNOSIS REQUIRED)    Answer:   need port for chemo    Order Specific Question:   Is the patient pregnant?    Answer:   No    Order Specific Question:   Preferred Imaging Location?    Answer:   [facility]     All questions were answered. The patient knows to call the clinic with any problems,  questions or concerns. I spent 55 minutes counseling the patient face to face. The total time spent in the appointment was 80 minutes and more than 50% was on counseling.     Heath Lark, MD 10/21/2016 4:10 PM

## 2016-10-21 NOTE — Assessment & Plan Note (Signed)
The patient had recent constipation, likely due to pain and her disease We discussed the importance of laxative therapy

## 2016-10-21 NOTE — Assessment & Plan Note (Signed)
I spent some time counseling the patient the importance of tobacco cessation. We discussed common strategies including nicotine patches, Tobacco Quit-line, and other nicotine replacement products to assist in hereffort to quit  she appears motivated to quit. Due to ongoing depression, I recommend we try Wellbutrin.

## 2016-10-21 NOTE — Progress Notes (Signed)
[facility] Psychosocial Distress Screening Clinical Social Work  Clinical Social Work was referred by distress screening protocol.  The patient scored a 10 on the Psychosocial Distress Thermometer which indicates severe distress. Clinical Social Worker contacted patient by phone to assess for distress and other psychosocial needs. Ms. Karle expressed feeling much better than on day of initial consult appointment.  CSW affirmed patient's feelings of anxiety and feeling overwhelmed.  The patient shared she has a strong family support system including her parents, aunt, grandparents, and fiance.  She shared she is no longer able to work, which she previously enjoyed as another difficult transition.  CSW provided brief emotional support and discussed CSW role at cancer center.  CSW reviewed supportive services available to patient.  ONCBCN DISTRESS SCREENING 10/18/2016  Screening Type Initial Screening  Distress experienced in past week (1-10) 10  Practical problem type Work/school;Food  Emotional problem type Depression;Nervousness/Anxiety;Adjusting to illness  Physical Problem type Pain;Nausea/vomiting;Sleep/insomnia;Getting around;Breathing;Loss of appetitie;Changes in urination;Constipation/diarrhea;Tingling hands/feet    Carlei Huang Alver Sorrow, MSW, LCSW, OSW-C Clinical Social Worker [facility] [phone]

## 2016-10-22 ENCOUNTER — Telehealth: Payer: Self-pay

## 2016-10-22 NOTE — Telephone Encounter (Signed)
Called and given appt information for [facility] for Monday April 30 th at 2 pm, patient given address and phone number. Verbalized understanding.

## 2016-10-22 NOTE — Telephone Encounter (Signed)
-----   Message from Heath Lark, MD sent at 10/22/2016  8:48 AM EDT ----- Regarding: fertility preservation clinic Please set up referral to fertility clinic

## 2016-10-22 NOTE — Telephone Encounter (Signed)
Referral called to [facility], faxed Dr. Alvy Bimler last office visit note to [phone]. Appt for Monday April 30 th at 2 pm , notified patient of appt in [place].

## 2016-10-25 ENCOUNTER — Encounter: Payer: Self-pay | Admitting: Hematology and Oncology

## 2016-10-26 ENCOUNTER — Other Ambulatory Visit: Payer: Self-pay | Admitting: Hematology and Oncology

## 2016-10-26 DIAGNOSIS — C530 Malignant neoplasm of endocervix: Secondary | ICD-10-CM

## 2016-10-27 ENCOUNTER — Telehealth: Payer: Self-pay | Admitting: Hematology and Oncology

## 2016-10-27 ENCOUNTER — Other Ambulatory Visit (HOSPITAL_BASED_OUTPATIENT_CLINIC_OR_DEPARTMENT_OTHER): Payer: Medicaid Other

## 2016-10-27 ENCOUNTER — Other Ambulatory Visit: Payer: Medicaid Other

## 2016-10-27 ENCOUNTER — Telehealth: Payer: Self-pay | Admitting: Hematology

## 2016-10-27 ENCOUNTER — Ambulatory Visit (HOSPITAL_BASED_OUTPATIENT_CLINIC_OR_DEPARTMENT_OTHER): Payer: Medicaid Other | Admitting: Hematology and Oncology

## 2016-10-27 ENCOUNTER — Telehealth: Payer: Self-pay | Admitting: *Deleted

## 2016-10-27 ENCOUNTER — Encounter: Payer: Self-pay | Admitting: *Deleted

## 2016-10-27 ENCOUNTER — Other Ambulatory Visit: Payer: Self-pay | Admitting: Radiology

## 2016-10-27 VITALS — BP 138/80 | HR 90 | Temp 97.9°F | Resp 22 | Ht 62.0 in | Wt 142.4 lb

## 2016-10-27 DIAGNOSIS — F418 Other specified anxiety disorders: Secondary | ICD-10-CM

## 2016-10-27 DIAGNOSIS — C530 Malignant neoplasm of endocervix: Secondary | ICD-10-CM

## 2016-10-27 DIAGNOSIS — G893 Neoplasm related pain (acute) (chronic): Secondary | ICD-10-CM

## 2016-10-27 DIAGNOSIS — Z720 Tobacco use: Secondary | ICD-10-CM | POA: Diagnosis not present

## 2016-10-27 DIAGNOSIS — C539 Malignant neoplasm of cervix uteri, unspecified: Secondary | ICD-10-CM

## 2016-10-27 LAB — CBC WITH DIFFERENTIAL/PLATELET
BASO%: 0.7 % (ref 0.0–2.0)
BASOS ABS: 0.1 10*3/uL (ref 0.0–0.1)
EOS ABS: 0.2 10*3/uL (ref 0.0–0.5)
EOS%: 2 % (ref 0.0–7.0)
HCT: 40.6 % (ref 34.8–46.6)
HGB: 13.5 g/dL (ref 11.6–15.9)
LYMPH%: 29 % (ref 14.0–49.7)
MCH: 28.2 pg (ref 25.1–34.0)
MCHC: 33.2 g/dL (ref 31.5–36.0)
MCV: 84.9 fL (ref 79.5–101.0)
MONO#: 0.6 10*3/uL (ref 0.1–0.9)
MONO%: 7.7 % (ref 0.0–14.0)
NEUT%: 60.6 % (ref 38.4–76.8)
NEUTROS ABS: 4.8 10*3/uL (ref 1.5–6.5)
Platelets: 385 10*3/uL (ref 145–400)
RBC: 4.79 10*6/uL (ref 3.70–5.45)
RDW: 13.3 % (ref 11.2–14.5)
WBC: 7.9 10*3/uL (ref 3.9–10.3)
lymph#: 2.3 10*3/uL (ref 0.9–3.3)

## 2016-10-27 LAB — COMPREHENSIVE METABOLIC PANEL
ALT: 15 U/L (ref 0–55)
ANION GAP: 9 meq/L (ref 3–11)
AST: 12 U/L (ref 5–34)
Albumin: 3.9 g/dL (ref 3.5–5.0)
Alkaline Phosphatase: 70 U/L (ref 40–150)
BILIRUBIN TOTAL: 0.32 mg/dL (ref 0.20–1.20)
BUN: 6.5 mg/dL — ABNORMAL LOW (ref 7.0–26.0)
CALCIUM: 9.7 mg/dL (ref 8.4–10.4)
CO2: 28 meq/L (ref 22–29)
CREATININE: 0.8 mg/dL (ref 0.6–1.1)
Chloride: 102 mEq/L (ref 98–109)
EGFR: >90 mL/min/{1.73_m2} (ref >90)
Glucose: 83 mg/dl (ref 70–140)
Potassium: 3.5 mEq/L (ref 3.5–5.1)
Sodium: 140 mEq/L (ref 136–145)
TOTAL PROTEIN: 7.5 g/dL (ref 6.4–8.3)

## 2016-10-27 LAB — MAGNESIUM: Magnesium: 2.3 mg/dl (ref 1.5–2.5)

## 2016-10-27 MED ORDER — PROCHLORPERAZINE MALEATE 10 MG PO TABS: 10.0000 mg | ORAL_TABLET | Freq: Four times a day (QID) | ORAL | 1 refills | Status: DC | PRN

## 2016-10-27 MED ORDER — LIDOCAINE-PRILOCAINE 2.5-2.5 % EX CREA
TOPICAL_CREAM | CUTANEOUS | 3 refills | Status: DC
Start: 2016-10-27 — End: 2016-12-16

## 2016-10-27 MED ORDER — MORPHINE SULFATE 15 MG PO TABS: 15.0000 mg | ORAL_TABLET | Freq: Four times a day (QID) | ORAL | 0 refills | Status: DC | PRN

## 2016-10-27 MED ORDER — ONDANSETRON HCL 8 MG PO TABS: 8.0000 mg | ORAL_TABLET | Freq: Two times a day (BID) | ORAL | 1 refills | Status: DC | PRN

## 2016-10-27 NOTE — Telephone Encounter (Signed)
Patient bypassed scheduling on 10/22/16.  Appointments scheduled per 10/22/16 los. Patient was mailed a copy of the appointment schedule and letter, per 10/22/16 los.

## 2016-10-27 NOTE — Telephone Encounter (Signed)
Referral called to [facility], initial visit will be $240, egg banking $ [phone] plus testing fees. Pt will call to make an appt

## 2016-10-27 NOTE — Telephone Encounter (Signed)
No additional appointments at this time, per 10/27/16 los. Patient was given a copy of the AVS report and appointment schedule, per 10/27/16 los.

## 2016-10-28 ENCOUNTER — Encounter: Payer: Self-pay | Admitting: Hematology and Oncology

## 2016-10-28 DIAGNOSIS — G893 Neoplasm related pain (acute) (chronic): Secondary | ICD-10-CM | POA: Insufficient documentation

## 2016-10-28 NOTE — Progress Notes (Signed)
[place] OFFICE PROGRESS NOTE  Patient Care Team: No Pcp Per Patient as PCP - General (General Practice)  SUMMARY OF ONCOLOGIC HISTORY:   Adenocarcinoma (GYN origin)   09/01/2016 Initial Diagnosis    Adenocarcinoma (GYN origin)      Cervical cancer ([place])   08/26/2016 Pathology Results    Adequacy Reason Satisfactory for evaluation, endocervical/transformation zone component PRESENT. Diagnosis ATYPICAL GLANDULAR CELLS. SEE COMMENT. COMMENT: THERE ARE GROUPS OF ATYPICAL GLANDULAR CELLS WHICH APPEAR TO BE OF ENDOCERVICAL ORIGIN. TISSUE STUDIES, SUCH AS A CURETTAGE, MAY HELP BETTER EVALUATE THE EXTENT AND SEVERITY OF THESE ATYPICAL CELLS.      09/01/2016 Initial Diagnosis    The patient reports she is not had Pap smears in the "many years". She presented initially with flank pain to the emergency room on 07/29/2016. She was treated for a UTI and had resolution of her flank pain although she continued to have suprapubic pain. She has had irregular bleeding and a copious discharge for several months. 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 Microscopic Comment Immunohistochemistry is attempted to determine endocervical vs. endometrial origin. CEA is focally positive, p16 is diffusely strongly positive, ER is weakly positive, and vimentin is positive. This profile is not specific as to origin and clinical correlation is recommended. Dr. Avis Epley has reviewed the case. The case was called to Dr. Ilda Basset on 09/06/2016      10/05/2016 Imaging    Ct abdomen: 4.7 cm heterogeneous enhancing mass in the cervix and lower uterine segment, consistent with known cervical carcinoma. Mild retroperitoneal lymphadenopathy in the inferior aortocaval space and right common iliac chain, suspicious for  metastatic disease.       INTERVAL HISTORY: Please see below for problem oriented charting. She returns for further follow-up with a friend She is disappointed with recent fertility clinic referral She is having a lot of the pelvic pain Denies recent vaginal bleeding No nausea or vomiting She felt that the Wellbutrin is helping her to quit smoking She is attempting to quit  REVIEW OF SYSTEMS:   Constitutional: Denies fevers, chills or abnormal weight loss Eyes: Denies blurriness of vision Ears, nose, mouth, throat, and face: Denies mucositis or sore throat Respiratory: Denies cough, dyspnea or wheezes Cardiovascular: Denies palpitation, chest discomfort or lower extremity swelling Gastrointestinal:  Denies nausea, heartburn or change in bowel habits Skin: Denies abnormal skin rashes Lymphatics: Denies new lymphadenopathy or easy bruising Neurological:Denies numbness, tingling or new weaknesses Behavioral/Psych: Mood is stable, no new changes  All other systems were reviewed with the patient and are negative.  I have reviewed the past medical history, past surgical history, social history and family history with the patient and they are unchanged from previous note.  ALLERGIES:  is allergic to amoxicillin and penicillins.  MEDICATIONS:  Current Outpatient Prescriptions  Medication Sig Dispense Refill  . buPROPion (WELLBUTRIN) 100 MG tablet Take 1 tablet (100 mg total) by mouth 2 (two) times daily. 60 tablet 1  . LORazepam (ATIVAN) 0.5 MG tablet Take 1 tablet (0.5 mg total) by mouth every 8 (eight) hours. As Needed for anxiety or nausea 30 tablet 0  . lidocaine-prilocaine (EMLA) cream Apply to affected area once 30 g 3  . morphine (MSIR) 15 MG tablet Take 1 tablet (15 mg total) by mouth every 6 (six)  hours as needed for severe pain. 60 tablet 0  . ondansetron (ZOFRAN) 8 MG tablet Take 1 tablet (8 mg total) by mouth 2 (two) times daily as needed. Start on the third day after  chemotherapy. 30 tablet 1  . prochlorperazine (COMPAZINE) 10 MG tablet Take 1 tablet (10 mg total) by mouth every 6 (six) hours as needed (Nausea or vomiting). 30 tablet 1   No current facility-administered medications for this visit.     PHYSICAL EXAMINATION: ECOG PERFORMANCE STATUS: 1 - Symptomatic but completely ambulatory  Vitals:   10/27/16 1508  BP: 138/80  Pulse: 90  Resp: (!) 22  Temp: 97.9 F (36.6 C)   Filed Weights   10/27/16 1508  Weight: 142 lb 6.4 oz (64.6 kg)    GENERAL:alert, no distress and comfortable SKIN: skin color, texture, turgor are normal, no rashes or significant lesions EYES: normal, Conjunctiva are pink and non-injected, sclera clear OROPHARYNX:no exudate, no erythema and lips, buccal mucosa, and tongue normal  NECK: She has palpable lymphadenopathy on the left side of her neck  Musculoskeletal:no cyanosis of digits and no clubbing  NEURO: alert & oriented x 3 with fluent speech, no focal motor/sensory deficits  LABORATORY DATA:  I have reviewed the data as listed    Component Value Date/Time   NA 140 10/27/2016 1452   K 3.5 10/27/2016 1452   CL 103 07/29/2016 1349   CO2 28 10/27/2016 1452   GLUCOSE 83 10/27/2016 1452   BUN 6.5 (L) 10/27/2016 1452   CREATININE 0.8 10/27/2016 1452   CALCIUM 9.7 10/27/2016 1452   PROT 7.5 10/27/2016 1452   ALBUMIN 3.9 10/27/2016 1452   AST 12 10/27/2016 1452   ALT 15 10/27/2016 1452   ALKPHOS 70 10/27/2016 1452   BILITOT 0.32 10/27/2016 1452   GFRNONAA >60 07/29/2016 1349   GFRAA >60 07/29/2016 1349    No results found for: SPEP, UPEP  Lab Results  Component Value Date   WBC 7.9 10/27/2016   NEUTROABS 4.8 10/27/2016   HGB 13.5 10/27/2016   HCT 40.6 10/27/2016   MCV 84.9 10/27/2016   PLT 385 10/27/2016      Chemistry      Component Value Date/Time   NA 140 10/27/2016 1452   K 3.5 10/27/2016 1452   CL 103 07/29/2016 1349   CO2 28 10/27/2016 1452   BUN 6.5 (L) 10/27/2016 1452   CREATININE  0.8 10/27/2016 1452      Component Value Date/Time   CALCIUM 9.7 10/27/2016 1452   ALKPHOS 70 10/27/2016 1452   AST 12 10/27/2016 1452   ALT 15 10/27/2016 1452   BILITOT 0.32 10/27/2016 1452       RADIOGRAPHIC STUDIES: I have personally reviewed the radiological images as listed and agreed with the findings in the report. Ct Abdomen Pelvis W Contrast  Result Date: 10/05/2016 CLINICAL DATA:  Newly diagnosed cervical carcinoma. Lower abdominal pain and nausea. EXAM: CT ABDOMEN AND PELVIS WITH CONTRAST TECHNIQUE: Multidetector CT imaging of the abdomen and pelvis was performed using the standard protocol following bolus administration of intravenous contrast. CONTRAST:  197mL ISOVUE-300 IOPAMIDOL (ISOVUE-300) INJECTION 61% COMPARISON:  Noncontrast CT on 07/29/2016 FINDINGS: Lower Chest: No acute findings. Hepatobiliary:  No masses identified. Gallbladder is unremarkable. Pancreas:  No mass or inflammatory changes. Spleen: Within normal limits in size and appearance. Adrenals/Urinary Tract: No masses identified. No evidence of hydronephrosis. Stomach/Bowel: No evidence of obstruction, inflammatory process or abnormal fluid collections. Vascular/Lymphatic: Mild retroperitoneal lymphadenopathy is seen in the  inferior aorto-caval space and the right common iliac chain, with largest lymph node measuring 10 mm on image 55/2. No upper abdominal lymphadenopathy identified. No abdominal aortic aneurysm. Reproductive: A heterogeneously enhancing mass is seen in the cervix and lower uterine segment which measures 4.3 by 4.7 x 2.6 cm on image 73/ 2. This is consistent with known cervical carcinoma. A 3.9 cm simple right ovarian follicular cyst is seen which is new since recent exam. No evidence of ascites. Other:  None. Musculoskeletal:  No suspicious bone lesions identified. IMPRESSION: 4.7 cm heterogeneous enhancing mass in the cervix and lower uterine segment, consistent with known cervical carcinoma. Mild  retroperitoneal lymphadenopathy in the inferior aortocaval space and right common iliac chain, suspicious for metastatic disease. Electronically Signed   By: Earle Gell M.D.   On: 10/05/2016 10:44    ASSESSMENT & PLAN:  Cervical cancer ([place]) I have a long discussion with the patient and family members. 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 We discussed fertility preservation  Initially, she declined referral.  She subsequently went to [facility] fertility clinic but they were not able to help her because they were in the process of relocation to a different place The patient was quite upset with this She is interested to be referred to another facility for second opinion before starting treatment PET CT scan is pending I will call the patient with test results If PET CT scan show metastatic disease, we may pursue systemic treatment only without radiation In that case, I will consider prescribing Lupron for fertility preservation before chemotherapy I will schedule chemo education class, port placement and to start her on treatment within the next 2 weeks I will see her back before we start treatment for chemotherapy consent and pain management  Tobacco abuse I spent some time counseling the patient the importance of tobacco cessation. We discussed common strategies including nicotine patches, Tobacco Quit-line, and other nicotine replacement products to assist in hereffort to quit  she appears motivated to quit. Due to ongoing depression, I recommend we try Wellbutrin. She is trying to quit smoking soon  Depression with anxiety She has signs and symptoms of anxiety and  depression She is not suicidal I recommend we try Wellbutrin.   Cancer associated pain She continues to have pelvic pain despite being prescribed different form of pain medicine I told her I would be willing to manage her pain as long as she is willing to comply with the rules of chronic pain management in the oncology clinic We discussed about the role of pain management in the oncology clinic. The prescribed pain regimen is for short term use only.  After the patient has completed the prescribed chemotherapy treatment, the pain medications will be slowly tapered off except for terminally ill patients who may have to remain on pain medications long term due to their terminal cancer.  The patient agreed to be compliant with prescribed pain regimen and promised not to share the prescribed medications. Any lost medications or missed prescription will not be refilled sooner than anticipated time when the patient's prescription is expected to run out. We also discussed narcotics refill policy in the clinic.  The patient is educated to check the pill bottles  in the middle of the week and ensure there is adequate supply to last through the weekend until next appointment or available business day.  The oncology service has a strict policy not to refill pain medications after business hours or the weekend.  If the patient is found to have violated the verbal agreement as stated no further pain medications will be prescribed in the future. I will assess her pain control in her next visit I warned her of risk of nausea and sedation with pain medicine   No orders of the defined types were placed in this encounter.  All questions were answered. The patient knows to call the clinic with any problems, questions or concerns. No barriers to learning was detected. I spent 30 minutes counseling the patient face to face. The total time spent in the appointment was 40 minutes and more than 50% was on counseling and  review of test results     Heath Lark, MD 10/28/2016 12:18 PM

## 2016-10-28 NOTE — Assessment & Plan Note (Signed)
I have a long discussion with the patient and family members. 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 We discussed fertility preservation  Initially, she declined referral.  She subsequently went to [facility] fertility clinic but they were not able to help her because they were in the process of relocation to a different place The patient was quite upset with this She is interested to be referred to another facility for second opinion before starting treatment PET CT scan is pending I will call the patient with test results If PET CT scan show metastatic disease, we may pursue systemic treatment only without radiation In that case, I will consider prescribing Lupron for fertility preservation before chemotherapy I will schedule chemo education class, port placement and to start her on treatment within the next 2 weeks I will see her back before we start treatment for chemotherapy consent and pain management

## 2016-10-28 NOTE — Assessment & Plan Note (Signed)
I spent some time counseling the patient the importance of tobacco cessation. We discussed common strategies including nicotine patches, Tobacco Quit-line, and other nicotine replacement products to assist in hereffort to quit  she appears motivated to quit. Due to ongoing depression, I recommend we try Wellbutrin. She is trying to quit smoking soon

## 2016-10-28 NOTE — Assessment & Plan Note (Signed)
She has signs and symptoms of anxiety and depression She is not suicidal I recommend we try Wellbutrin.

## 2016-10-28 NOTE — Assessment & Plan Note (Signed)
She continues to have pelvic pain despite being prescribed different form of pain medicine I told her I would be willing to manage her pain as long as she is willing to comply with the rules of chronic pain management in the oncology clinic We discussed about the role of pain management in the oncology clinic. The prescribed pain regimen is for short term use only.  After the patient has completed the prescribed chemotherapy treatment, the pain medications will be slowly tapered off except for terminally ill patients who may have to remain on pain medications long term due to their terminal cancer.  The patient agreed to be compliant with prescribed pain regimen and promised not to share the prescribed medications. Any lost medications or missed prescription will not be refilled sooner than anticipated time when the patient's prescription is expected to run out. We also discussed narcotics refill policy in the clinic.  The patient is educated to check the pill bottles in the middle of the week and ensure there is adequate supply to last through the weekend until next appointment or available business day.  The oncology service has a strict policy not to refill pain medications after business hours or the weekend.  If the patient is found to have violated the verbal agreement as stated no further pain medications will be prescribed in the future. I will assess her pain control in her next visit I warned her of risk of nausea and sedation with pain medicine

## 2016-10-29 ENCOUNTER — Ambulatory Visit (HOSPITAL_COMMUNITY)
Admission: RE | Admit: 2016-10-29 | Discharge: 2016-10-29 | Disposition: A | Discharge status: Home / Self Care | Payer: Medicaid Other | Source: Ambulatory Visit | Attending: Hematology and Oncology | Admitting: Hematology and Oncology

## 2016-10-29 ENCOUNTER — Other Ambulatory Visit: Payer: Self-pay | Admitting: Hematology and Oncology

## 2016-10-29 ENCOUNTER — Encounter (HOSPITAL_COMMUNITY): Payer: Self-pay

## 2016-10-29 ENCOUNTER — Other Ambulatory Visit: Payer: Self-pay | Admitting: Radiation Oncology

## 2016-10-29 DIAGNOSIS — C539 Malignant neoplasm of cervix uteri, unspecified: Secondary | ICD-10-CM | POA: Diagnosis not present

## 2016-10-29 DIAGNOSIS — F419 Anxiety disorder, unspecified: Secondary | ICD-10-CM | POA: Insufficient documentation

## 2016-10-29 DIAGNOSIS — F329 Major depressive disorder, single episode, unspecified: Secondary | ICD-10-CM | POA: Diagnosis not present

## 2016-10-29 DIAGNOSIS — Z880 Allergy status to penicillin: Secondary | ICD-10-CM | POA: Insufficient documentation

## 2016-10-29 DIAGNOSIS — C530 Malignant neoplasm of endocervix: Secondary | ICD-10-CM

## 2016-10-29 DIAGNOSIS — F17210 Nicotine dependence, cigarettes, uncomplicated: Secondary | ICD-10-CM | POA: Insufficient documentation

## 2016-10-29 HISTORY — PX: IR FLUORO GUIDE PORT INSERTION RIGHT: IMG5741

## 2016-10-29 HISTORY — PX: IR US GUIDE VASC ACCESS RIGHT: IMG2390

## 2016-10-29 LAB — PREGNANCY, URINE: Preg Test, Ur: NEGATIVE

## 2016-10-29 LAB — BASIC METABOLIC PANEL
Anion gap: 8 (ref 5–15)
BUN: 8 mg/dL (ref 6–20)
CHLORIDE: 103 mmol/L (ref 101–111)
CO2: 26 mmol/L (ref 22–32)
CREATININE: 0.86 mg/dL (ref 0.44–1.00)
Calcium: 9.1 mg/dL (ref 8.9–10.3)
GFR calc Af Amer: >60 mL/min (ref >60)
GFR calc non Af Amer: >60 mL/min (ref >60)
GLUCOSE: 98 mg/dL (ref 65–99)
POTASSIUM: 3.4 mmol/L — AB (ref 3.5–5.1)
SODIUM: 137 mmol/L (ref 135–145)

## 2016-10-29 LAB — CBC WITH DIFFERENTIAL/PLATELET
Basophils Absolute: 0 10*3/uL (ref 0.0–0.1)
Basophils Relative: 0 %
EOS ABS: 0.2 10*3/uL (ref 0.0–0.7)
EOS PCT: 3 %
HCT: 36.3 % (ref 36.0–46.0)
Hemoglobin: 12.2 g/dL (ref 12.0–15.0)
LYMPHS ABS: 2.3 10*3/uL (ref 0.7–4.0)
LYMPHS PCT: 34 %
MCH: 28.4 pg (ref 26.0–34.0)
MCHC: 33.6 g/dL (ref 30.0–36.0)
MCV: 84.4 fL (ref 78.0–100.0)
MONO ABS: 0.5 10*3/uL (ref 0.1–1.0)
MONOS PCT: 7 %
Neutro Abs: 3.8 10*3/uL (ref 1.7–7.7)
Neutrophils Relative %: 56 %
PLATELETS: 388 10*3/uL (ref 150–400)
RBC: 4.3 MIL/uL (ref 3.87–5.11)
RDW: 13.1 % (ref 11.5–15.5)
WBC: 6.8 10*3/uL (ref 4.0–10.5)

## 2016-10-29 LAB — PROTIME-INR
INR: 1
PROTHROMBIN TIME: 13.2 s (ref 11.4–15.2)

## 2016-10-29 MED ORDER — LIDOCAINE-EPINEPHRINE (PF) 2 %-1:200000 IJ SOLN
INTRAMUSCULAR | Status: AC
  Administered 2016-10-29: 10 mL
  Filled 2016-10-29: qty 20

## 2016-10-29 MED ORDER — SODIUM CHLORIDE 0.9 % IV SOLN
INTRAVENOUS | Status: DC
  Administered 2016-10-29: 13:00:00 via INTRAVENOUS

## 2016-10-29 MED ORDER — VANCOMYCIN HCL IN DEXTROSE 1-5 GM/200ML-% IV SOLN
1000.0000 mg | INTRAVENOUS | Status: AC
  Administered 2016-10-29: 1000 mg via INTRAVENOUS

## 2016-10-29 MED ORDER — HEPARIN SOD (PORK) LOCK FLUSH 100 UNIT/ML IV SOLN
INTRAVENOUS | Status: AC
  Administered 2016-10-29: 17:00:00
  Filled 2016-10-29: qty 5

## 2016-10-29 MED ORDER — MIDAZOLAM HCL 2 MG/2ML IJ SOLN
INTRAMUSCULAR | Status: AC | PRN
  Administered 2016-10-29 (×3): 1 mg via INTRAVENOUS

## 2016-10-29 MED ORDER — FENTANYL CITRATE (PF) 100 MCG/2ML IJ SOLN
INTRAMUSCULAR | Status: AC | PRN
  Administered 2016-10-29 (×2): 50 ug via INTRAVENOUS

## 2016-10-29 MED ORDER — VANCOMYCIN HCL IN DEXTROSE 1-5 GM/200ML-% IV SOLN
INTRAVENOUS | Status: AC
  Filled 2016-10-29: qty 200

## 2016-10-29 MED ORDER — FENTANYL CITRATE (PF) 100 MCG/2ML IJ SOLN
INTRAMUSCULAR | Status: AC
  Filled 2016-10-29: qty 4

## 2016-10-29 MED ORDER — MIDAZOLAM HCL 2 MG/2ML IJ SOLN
INTRAMUSCULAR | Status: AC
  Filled 2016-10-29: qty 4

## 2016-10-29 NOTE — Procedures (Signed)
Cervical ca  s/p RT IJ power port  Tip svcra No comp Stable ready for use Full report in PACS

## 2016-10-29 NOTE — Sedation Documentation (Signed)
Patient is resting comfortably. 

## 2016-10-29 NOTE — H&P (Signed)
Chief Complaint: Patient was seen in consultation today for cervical cancer  Referring Physician(s):  Gorsuch,Ni  Supervising Physician: Daryll Brod  Patient Status: [facility] - Out-pt  History of Present Illness: Joanne Griffin is a 31 y.o. female with past medical history of anxiety and depression presents with recent diagnosis of adenocarcinoma of the cervix.   IR consulted for Port-A-Cath placement at the request of Dr. Annamaria Boots.   Patient has been NPO.  She does not take blood thinners.  She has been in her usual state of health.   Past Medical History:  Diagnosis Date  . 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 Anxiety   . Cervical cancer ([place])   . Cyst, dermoid, arm   . Depression     Past Surgical History:  Procedure Laterality Date  . CYST REMOVAL HAND    . WISDOM TOOTH EXTRACTION     times 4    Allergies: Amoxicillin and Penicillins  Medications: Prior to Admission medications   Medication Sig Start Date End Date Taking? Authorizing Provider  buPROPion (WELLBUTRIN) 100 MG tablet Take 1 tablet (100 mg total) by mouth 2 (two) times daily. 10/20/16  Yes Heath Lark, MD  LORazepam (ATIVAN) 0.5 MG tablet Take 1 tablet (0.5 mg total) by mouth every 8 (eight) hours. As Needed for anxiety or nausea 10/18/16  Yes Gery Pray, MD  morphine (MSIR) 15 MG tablet Take 1 tablet (15 mg total) by mouth every 6 (six) hours as needed for severe pain. 10/27/16  Yes Heath Lark, MD  prochlorperazine (COMPAZINE) 10 MG tablet Take 1 tablet (10 mg total) by mouth every 6 (six) hours as needed (Nausea or vomiting). 10/27/16  Yes Heath Lark, MD  lidocaine-prilocaine (EMLA) cream Apply to affected area once 10/27/16   Heath Lark, MD  ondansetron (ZOFRAN) 8 MG tablet Take 1 tablet (8 mg total) by mouth 2 (two) times daily as needed. Start on the third day after chemotherapy. 10/27/16   Heath Lark, MD     Family History  Problem Relation Age of Onset  . Diabetes Maternal Grandmother   . Breast  cancer Paternal Grandmother     Social History   Social History  . Marital status: Single    Spouse name: N/A  . Number of children: 0  . Years of education: N/A   Social History Main Topics  . Smoking status: Current Every Day Smoker    Packs/day: 1.00    Years: 13.00    Types: Cigarettes  . Smokeless tobacco: Never Used     Comment: less than a pack per day now  . Alcohol use No     Comment: Quit drinking 8 months ago  . Drug use: No  . Sexual activity: Yes    Birth control/ protection: None   Other Topics Concern  . None   Social History Narrative  . None    Review of Systems  Constitutional: Negative for fatigue and fever.  Respiratory: Negative for cough and shortness of breath.   Cardiovascular: Negative for chest pain.  Psychiatric/Behavioral: Negative for behavioral problems and confusion.    Vital Signs: BP 120/78   Pulse 83   Temp 98.3 F (36.8 C) (Oral)   Resp 16   Ht 5\' 2"  (1.575 m)   Wt 142 lb 6 oz (64.6 kg)   LMP 10/23/2016 (Exact Date)   SpO2 95%   BMI 26.04 kg/m   Physical Exam  Constitutional: She is oriented to person, place, and time. She appears  well-developed.  Cardiovascular: Normal rate, regular rhythm and normal heart sounds.   Pulmonary/Chest: Effort normal and breath sounds normal. No respiratory distress.  Abdominal: Soft.  Neurological: She is alert and oriented to person, place, and time.  Skin: Skin is warm and dry.  Psychiatric: She has a normal mood and affect. Her behavior is normal. Judgment and thought content normal.  Nursing note and vitals reviewed.   Mallampati Score:  MD Evaluation Airway: WNL Heart: WNL Abdomen: WNL Chest/ Lungs: WNL ASA  Classification: 3 Mallampati/Airway Score: Two  Imaging: Ct Abdomen Pelvis W Contrast  Result Date: 10/05/2016 CLINICAL DATA:  Newly diagnosed cervical carcinoma. Lower abdominal pain and nausea. EXAM: CT ABDOMEN AND PELVIS WITH CONTRAST TECHNIQUE: Multidetector CT  imaging of the abdomen and pelvis was performed using the standard protocol following bolus administration of intravenous contrast. CONTRAST:  199mL ISOVUE-300 IOPAMIDOL (ISOVUE-300) INJECTION 61% COMPARISON:  Noncontrast CT on 07/29/2016 FINDINGS: Lower Chest: No acute findings. Hepatobiliary:  No masses identified. Gallbladder is unremarkable. Pancreas:  No mass or inflammatory changes. Spleen: Within normal limits in size and appearance. Adrenals/Urinary Tract: No masses identified. No evidence of hydronephrosis. Stomach/Bowel: No evidence of obstruction, inflammatory process or abnormal fluid collections. Vascular/Lymphatic: Mild retroperitoneal lymphadenopathy is seen in the inferior aorto-caval space and the right common iliac chain, with largest lymph node measuring 10 mm on image 55/2. No upper abdominal lymphadenopathy identified. No abdominal aortic aneurysm. Reproductive: A heterogeneously enhancing mass is seen in the cervix and lower uterine segment which measures 4.3 by 4.7 x 2.6 cm on image 73/ 2. This is consistent with known cervical carcinoma. A 3.9 cm simple right ovarian follicular cyst is seen which is new since recent exam. No evidence of ascites. Other:  None. Musculoskeletal:  No suspicious bone lesions identified. IMPRESSION: 4.7 cm heterogeneous enhancing mass in the cervix and lower uterine segment, consistent with known cervical carcinoma. Mild retroperitoneal lymphadenopathy in the inferior aortocaval space and right common iliac chain, suspicious for metastatic disease. Electronically Signed   By: Earle Gell M.D.   On: 10/05/2016 10:44    Labs:  CBC:  Recent Labs  07/29/16 1349 10/27/16 1453  WBC 10.5 7.9  HGB 11.5* 13.5  HCT 34.8* 40.6  PLT 399 385    COAGS: No results for input(s): INR, APTT in the last 8760 hours.  BMP:  Recent Labs  07/29/16 1349 10/27/16 1452  NA 136 140  K 3.5 3.5  CL 103  --   CO2 22 28  GLUCOSE 99 83  BUN 6 6.5*  CALCIUM 8.9 9.7    CREATININE 0.74 0.8  GFRNONAA >60  --   GFRAA >60  --     LIVER FUNCTION TESTS:  Recent Labs  07/29/16 1349 10/27/16 1452  BILITOT 0.5 0.32  AST 15 12  ALT 16 15  ALKPHOS 49 70  PROT 6.6 7.5  ALBUMIN 3.5 3.9    TUMOR MARKERS: No results for input(s): AFPTM, CEA, CA199, CHROMGRNA in the last 8760 hours.  Assessment and Plan: Patient with past medical history of anxiety and depression presents with need for long-term access for treatment of recently diagnosed cervical cancer.  Patient has been NPO. She does not take blood thinners.  She has been in her usual state of health.  Risks and benefits discussed with the patient including, but not limited to bleeding, infection, pneumothorax, or fibrin sheath development and need for additional procedures. All of the patient's questions were answered, patient is agreeable to proceed. Consent signed  and in chart.  Thank you for this interesting consult.  I greatly enjoyed meeting [place] and look forward to participating in their care.  A copy of this report was sent to the requesting provider on this date.  Electronically Signed: Docia Barrier 10/29/2016, 1:43 PM   I spent a total of  30 Minutes   in face to face in clinical consultation, greater than 50% of which was counseling/coordinating care for cervical cancer.

## 2016-10-29 NOTE — Discharge Instructions (Addendum)

## 2016-11-01 ENCOUNTER — Telehealth: Payer: Self-pay | Admitting: Hematology and Oncology

## 2016-11-01 ENCOUNTER — Encounter (HOSPITAL_COMMUNITY)
Admission: RE | Admit: 2016-11-01 | Discharge: 2016-11-01 | Disposition: A | Discharge status: Home / Self Care | Payer: Medicaid Other | Source: Ambulatory Visit | Attending: Radiation Oncology | Admitting: Radiation Oncology

## 2016-11-01 DIAGNOSIS — C801 Malignant (primary) neoplasm, unspecified: Secondary | ICD-10-CM | POA: Diagnosis present

## 2016-11-01 LAB — GLUCOSE, CAPILLARY: Glucose-Capillary: 109 mg/dL — ABNORMAL HIGH (ref 65–99)

## 2016-11-01 MED ORDER — FLUDEOXYGLUCOSE F - 18 (FDG) INJECTION
7.1900 | Freq: Once | INTRAVENOUS | Status: AC | PRN
  Administered 2016-11-01: 7.19 via INTRAVENOUS

## 2016-11-01 NOTE — Telephone Encounter (Signed)
lvm to infrorm pt of added lab/flush appt 5/8 per LOS

## 2016-11-01 NOTE — Progress Notes (Signed)
Has armband been applied?  Yes  Does patient have an allergy to IV contrast dye?: No.   Has patient ever received premedication for IV contrast dye?: Yes   Does patient take metformin?: No  If patient does take metformin when was the last dose: n/a  Date of lab work: Oct 29, 2016 BUN: 8 CR: 0.86  IV site: Right Porta cath  Has IV site been added to flowsheet?  Yes

## 2016-11-02 ENCOUNTER — Ambulatory Visit (HOSPITAL_BASED_OUTPATIENT_CLINIC_OR_DEPARTMENT_OTHER): Payer: Medicaid Other

## 2016-11-02 ENCOUNTER — Ambulatory Visit
Admission: RE | Admit: 2016-11-02 | Discharge: 2016-11-02 | Disposition: A | Discharge status: Home / Self Care | Payer: Medicaid Other | Source: Ambulatory Visit | Attending: Radiation Oncology | Admitting: Radiation Oncology

## 2016-11-02 ENCOUNTER — Other Ambulatory Visit: Payer: Medicaid Other

## 2016-11-02 ENCOUNTER — Ambulatory Visit: Payer: Medicaid Other

## 2016-11-02 DIAGNOSIS — C530 Malignant neoplasm of endocervix: Secondary | ICD-10-CM

## 2016-11-02 DIAGNOSIS — Z452 Encounter for adjustment and management of vascular access device: Secondary | ICD-10-CM | POA: Diagnosis present

## 2016-11-02 DIAGNOSIS — C539 Malignant neoplasm of cervix uteri, unspecified: Secondary | ICD-10-CM

## 2016-11-02 DIAGNOSIS — Z95828 Presence of other vascular implants and grafts: Secondary | ICD-10-CM

## 2016-11-02 DIAGNOSIS — Z510 Encounter for antineoplastic radiation therapy: Secondary | ICD-10-CM | POA: Diagnosis not present

## 2016-11-02 LAB — COMPREHENSIVE METABOLIC PANEL
ALT: 11 U/L (ref 0–55)
AST: 11 U/L (ref 5–34)
Albumin: 3.8 g/dL (ref 3.5–5.0)
Alkaline Phosphatase: 62 U/L (ref 40–150)
Anion Gap: 8 mEq/L (ref 3–11)
BUN: 6.7 mg/dL — ABNORMAL LOW (ref 7.0–26.0)
CHLORIDE: 103 meq/L (ref 98–109)
CO2: 27 meq/L (ref 22–29)
Calcium: 9.7 mg/dL (ref 8.4–10.4)
Creatinine: 0.9 mg/dL (ref 0.6–1.1)
EGFR: 90 mL/min/{1.73_m2} — AB (ref >90)
GLUCOSE: 106 mg/dL (ref 70–140)
POTASSIUM: 3.9 meq/L (ref 3.5–5.1)
SODIUM: 138 meq/L (ref 136–145)
TOTAL PROTEIN: 7.5 g/dL (ref 6.4–8.3)
Total Bilirubin: 0.39 mg/dL (ref 0.20–1.20)

## 2016-11-02 LAB — CBC WITH DIFFERENTIAL/PLATELET
BASO%: 0.5 % (ref 0.0–2.0)
Basophils Absolute: 0 10*3/uL (ref 0.0–0.1)
EOS ABS: 0.2 10*3/uL (ref 0.0–0.5)
EOS%: 2.5 % (ref 0.0–7.0)
HCT: 40.3 % (ref 34.8–46.6)
HGB: 13.5 g/dL (ref 11.6–15.9)
LYMPH%: 28.7 % (ref 14.0–49.7)
MCH: 28.5 pg (ref 25.1–34.0)
MCHC: 33.6 g/dL (ref 31.5–36.0)
MCV: 85.1 fL (ref 79.5–101.0)
MONO#: 0.5 10*3/uL (ref 0.1–0.9)
MONO%: 6.6 % (ref 0.0–14.0)
NEUT%: 61.7 % (ref 38.4–76.8)
NEUTROS ABS: 4.5 10*3/uL (ref 1.5–6.5)
Platelets: 363 10*3/uL (ref 145–400)
RBC: 4.74 10*6/uL (ref 3.70–5.45)
RDW: 13.1 % (ref 11.2–14.5)
WBC: 7.4 10*3/uL (ref 3.9–10.3)
lymph#: 2.1 10*3/uL (ref 0.9–3.3)

## 2016-11-02 LAB — MAGNESIUM: Magnesium: 2 mg/dl (ref 1.5–2.5)

## 2016-11-02 MED ORDER — HEPARIN SOD (PORK) LOCK FLUSH 10 UNIT/ML IV SOLN
10.0000 [IU] | Freq: Once | INTRAVENOUS | Status: DC
  Filled 2016-11-02: qty 1

## 2016-11-02 MED ORDER — SODIUM CHLORIDE 0.9% FLUSH
10.0000 mL | Freq: Once | INTRAVENOUS | Status: AC
  Administered 2016-11-02: 10 mL via INTRAVENOUS

## 2016-11-02 MED ORDER — HEPARIN SOD (PORK) LOCK FLUSH 100 UNIT/ML IV SOLN
500.0000 [IU] | Freq: Once | INTRAVENOUS | Status: AC
  Administered 2016-11-02: 500 [IU] via INTRAVENOUS

## 2016-11-02 MED ORDER — SODIUM CHLORIDE 0.9% FLUSH
10.0000 mL | Freq: Once | INTRAVENOUS | Status: AC
  Administered 2016-11-02: 10 mL
  Filled 2016-11-02: qty 10

## 2016-11-02 MED ORDER — HEPARIN SOD (PORK) LOCK FLUSH 100 UNIT/ML IV SOLN
500.0000 [IU] | Freq: Once | INTRAVENOUS | Status: AC
  Administered 2016-11-02: 500 [IU]
  Filled 2016-11-02: qty 5

## 2016-11-03 ENCOUNTER — Other Ambulatory Visit: Payer: Self-pay | Admitting: Radiation Oncology

## 2016-11-03 ENCOUNTER — Telehealth: Payer: Self-pay | Admitting: Oncology

## 2016-11-03 DIAGNOSIS — C530 Malignant neoplasm of endocervix: Secondary | ICD-10-CM

## 2016-11-03 DIAGNOSIS — Z510 Encounter for antineoplastic radiation therapy: Secondary | ICD-10-CM | POA: Diagnosis not present

## 2016-11-03 MED ORDER — LORAZEPAM 0.5 MG PO TABS: 0.5000 mg | ORAL_TABLET | Freq: Three times a day (TID) | ORAL | 0 refills | Status: DC

## 2016-11-03 NOTE — Telephone Encounter (Signed)
Called in refill for Ativan to [facility] per Dr. Sondra Come.

## 2016-11-04 ENCOUNTER — Ambulatory Visit
Admission: RE | Admit: 2016-11-04 | Discharge: 2016-11-04 | Disposition: A | Discharge status: Home / Self Care | Payer: Medicaid Other | Source: Ambulatory Visit | Attending: Radiation Oncology | Admitting: Radiation Oncology

## 2016-11-04 DIAGNOSIS — C530 Malignant neoplasm of endocervix: Secondary | ICD-10-CM

## 2016-11-04 DIAGNOSIS — Z510 Encounter for antineoplastic radiation therapy: Secondary | ICD-10-CM | POA: Diagnosis not present

## 2016-11-09 ENCOUNTER — Ambulatory Visit
Admission: RE | Admit: 2016-11-09 | Discharge: 2016-11-09 | Disposition: A | Discharge status: Home / Self Care | Payer: Medicaid Other | Source: Ambulatory Visit | Attending: Radiation Oncology | Admitting: Radiation Oncology

## 2016-11-09 ENCOUNTER — Telehealth: Payer: Self-pay | Admitting: Hematology and Oncology

## 2016-11-09 ENCOUNTER — Encounter: Payer: Self-pay | Admitting: General Practice

## 2016-11-09 ENCOUNTER — Telehealth: Payer: Self-pay

## 2016-11-09 ENCOUNTER — Ambulatory Visit: Payer: Medicaid Other | Admitting: Nutrition

## 2016-11-09 ENCOUNTER — Inpatient Hospital Stay
Admission: RE | Admit: 2016-11-09 | Discharge: 2016-11-09 | Disposition: A | Discharge status: Home / Self Care | Payer: Self-pay | Source: Ambulatory Visit | Attending: Radiation Oncology | Admitting: Radiation Oncology

## 2016-11-09 ENCOUNTER — Other Ambulatory Visit: Payer: Self-pay | Admitting: Hematology and Oncology

## 2016-11-09 ENCOUNTER — Ambulatory Visit (HOSPITAL_BASED_OUTPATIENT_CLINIC_OR_DEPARTMENT_OTHER): Payer: Medicaid Other | Admitting: Hematology and Oncology

## 2016-11-09 ENCOUNTER — Ambulatory Visit (HOSPITAL_BASED_OUTPATIENT_CLINIC_OR_DEPARTMENT_OTHER): Payer: Medicaid Other

## 2016-11-09 ENCOUNTER — Encounter: Payer: Self-pay | Admitting: Hematology and Oncology

## 2016-11-09 VITALS — BP 116/70 | HR 83 | Temp 97.7°F | Resp 18

## 2016-11-09 DIAGNOSIS — C530 Malignant neoplasm of endocervix: Secondary | ICD-10-CM

## 2016-11-09 DIAGNOSIS — Z5111 Encounter for antineoplastic chemotherapy: Secondary | ICD-10-CM

## 2016-11-09 DIAGNOSIS — K5909 Other constipation: Secondary | ICD-10-CM

## 2016-11-09 DIAGNOSIS — Z720 Tobacco use: Secondary | ICD-10-CM

## 2016-11-09 DIAGNOSIS — Z510 Encounter for antineoplastic radiation therapy: Secondary | ICD-10-CM | POA: Diagnosis not present

## 2016-11-09 DIAGNOSIS — G893 Neoplasm related pain (acute) (chronic): Secondary | ICD-10-CM

## 2016-11-09 DIAGNOSIS — F418 Other specified anxiety disorders: Secondary | ICD-10-CM | POA: Diagnosis not present

## 2016-11-09 MED ORDER — MORPHINE SULFATE 15 MG PO TABS: 15.0000 mg | ORAL_TABLET | Freq: Four times a day (QID) | ORAL | 0 refills | Status: DC | PRN

## 2016-11-09 MED ORDER — SODIUM CHLORIDE 0.9 % IV SOLN
40.0000 mg/m2 | Freq: Once | INTRAVENOUS | Status: AC
  Administered 2016-11-09: 68 mg via INTRAVENOUS
  Filled 2016-11-09: qty 68

## 2016-11-09 MED ORDER — OXYCODONE-ACETAMINOPHEN 5-325 MG PO TABS
2.0000 | ORAL_TABLET | Freq: Once | ORAL | Status: AC
  Administered 2016-11-09: 2 via ORAL

## 2016-11-09 MED ORDER — SODIUM CHLORIDE 0.9% FLUSH
10.0000 mL | INTRAVENOUS | Status: DC | PRN
  Administered 2016-11-09: 10 mL
  Filled 2016-11-09: qty 10

## 2016-11-09 MED ORDER — OXYCODONE-ACETAMINOPHEN 5-325 MG PO TABS
ORAL_TABLET | ORAL | Status: AC
  Filled 2016-11-09: qty 2

## 2016-11-09 MED ORDER — SODIUM CHLORIDE 0.9 % IV SOLN
Freq: Once | INTRAVENOUS | Status: AC
  Administered 2016-11-09: 10:00:00 via INTRAVENOUS
  Filled 2016-11-09: qty 5

## 2016-11-09 MED ORDER — POTASSIUM CHLORIDE 2 MEQ/ML IV SOLN
Freq: Once | INTRAVENOUS | Status: AC
  Administered 2016-11-09: 08:00:00 via INTRAVENOUS
  Filled 2016-11-09: qty 10

## 2016-11-09 MED ORDER — PALONOSETRON HCL INJECTION 0.25 MG/5ML
INTRAVENOUS | Status: AC
  Filled 2016-11-09: qty 5

## 2016-11-09 MED ORDER — PALONOSETRON HCL INJECTION 0.25 MG/5ML
0.2500 mg | Freq: Once | INTRAVENOUS | Status: AC
  Administered 2016-11-09: 0.25 mg via INTRAVENOUS

## 2016-11-09 MED ORDER — HEPARIN SOD (PORK) LOCK FLUSH 100 UNIT/ML IV SOLN
500.0000 [IU] | Freq: Once | INTRAVENOUS | Status: AC | PRN
  Administered 2016-11-09: 500 [IU]
  Filled 2016-11-09: qty 5

## 2016-11-09 MED ORDER — RADIAPLEXRX EX GEL
Freq: Once | CUTANEOUS | Status: AC
  Administered 2016-11-09: 17:00:00 via TOPICAL

## 2016-11-09 MED ORDER — SODIUM CHLORIDE 0.9 % IV SOLN
Freq: Once | INTRAVENOUS | Status: AC
  Administered 2016-11-09: 08:00:00 via INTRAVENOUS

## 2016-11-09 NOTE — Assessment & Plan Note (Signed)
She has signs and symptoms of anxiety and depression She is not suicidal She felt that Wellbutrin is giving her a sense of well-being.  She will continue the same.

## 2016-11-09 NOTE — Assessment & Plan Note (Signed)
she appears motivated to quit. Wellbutrin is helping and she is smoking less.  I congratulated her efforts

## 2016-11-09 NOTE — Progress Notes (Signed)
Pt here for patient teaching.  Pt given Radiation and You booklet and Radiaplex gel.  Reviewed areas of pertinence such as diarrhea, fatigue, nausea and vomiting, sexual and fertility changes, skin changes, throat changes and urinary and bladder changes . Pt able to give teach back of to pat skin, use unscented/gentle soap, have Imodium on hand and drink plenty of water,apply Radiaplex bid and avoid applying anything to skin within 4 hours of treatment. Pt demonstrated understanding and verbalizes understanding of information given and will contact nursing with any questions or concerns.

## 2016-11-09 NOTE — Progress Notes (Signed)
31 year old female diagnosed with cervical cancer.  She is a patient of Dr. Alvy Bimler and Dr. Sondra Come receiving chemoradiation therapy.  Past medical history includes anxiety, depression, and tobacco.  Medications include Wellbutrin, Ativan, Zofran, Compazine and morphine.  Labs include albumin 3.8.  Height: 5 feet 2 inches. Weight: 142 pounds. Usual body weight: 150 pounds in March 2018 BMI: 26.04.  Patient reports 8 pound weight loss was unintentional. She has a decreased appetite which she relates to constipation. She is working to improve her bowel regimen so she has regular bowel movements. She does not have other nutrition impact symptoms.  Nutrition diagnosis:  Food and nutrition related knowledge deficit related to cervical cancer and associated treatments as evidenced by no prior need for nutrition related information.  Intervention: Educated patient on the importance of smaller more frequent meals and snacks with high-calorie, high-protein foods to promote weight maintenance. Educated patient on strategies for improving constipation. Encouraged increased fluid intake. Educated patient on potential side effects with cisplatin. Fact sheets were provided.  Questions were answered.  Teach back method used.  Monitoring, evaluation, goals:  Patient will tolerate adequate calories and protein to minimize weight loss throughout treatment.  Next visit: Patient will contact me for questions or concerns.  **Disclaimer: This note was dictated with voice recognition software. Similar sounding words can inadvertently be transcribed and this note may contain transcription errors which may not have been corrected upon publication of note.**

## 2016-11-09 NOTE — Progress Notes (Signed)
[facility] OFFICE PROGRESS NOTE  Patient Care Team: Patient, No Pcp Per as PCP - General (General Practice)  SUMMARY OF ONCOLOGIC HISTORY:   Adenocarcinoma (GYN origin)   09/01/2016 Initial Diagnosis    Adenocarcinoma (GYN origin)      Cervical cancer ([place])   08/26/2016 Pathology Results    Adequacy Reason Satisfactory for evaluation, endocervical/transformation zone component PRESENT. Diagnosis ATYPICAL GLANDULAR CELLS. SEE COMMENT. COMMENT: THERE ARE GROUPS OF ATYPICAL GLANDULAR CELLS WHICH APPEAR TO BE OF ENDOCERVICAL ORIGIN. TISSUE STUDIES, SUCH AS A CURETTAGE, MAY HELP BETTER EVALUATE THE EXTENT AND SEVERITY OF THESE ATYPICAL CELLS.      09/01/2016 Initial Diagnosis    The patient reports she is not had Pap smears in the "many years". She presented initially with flank pain to the emergency room on 07/29/2016. She was treated for a UTI and had resolution of her flank pain although she continued to have suprapubic pain. She has had irregular bleeding and a copious discharge for several months. 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 Microscopic Comment Immunohistochemistry is attempted to determine endocervical vs. endometrial origin. CEA is focally positive, p16 is diffusely strongly positive, ER is weakly positive, and vimentin is positive. This profile is not specific as to origin and clinical correlation is recommended. Dr. Avis Epley has reviewed the case. The case was called to Dr. Ilda Basset on 09/06/2016      10/05/2016 Imaging    Ct abdomen: 4.7 cm heterogeneous enhancing mass in the cervix and lower uterine segment, consistent with known cervical carcinoma. Mild retroperitoneal lymphadenopathy in the inferior aortocaval space and right common iliac chain, suspicious for  metastatic disease.      10/29/2016 Procedure    Ultrasound and fluoroscopically guided right internal jugular single lumen power port catheter insertion. Tip in the SVC/RA junction. Catheter ready for use      11/09/2016 -  Radiation Therapy    She receives concurrent radiation treatment      11/09/2016 -  Chemotherapy    She receives weekly cisplatin       INTERVAL HISTORY: Please see below for problem oriented charting. She is seen in the treatment room. She is doing well. She had recent severe constipation, resolved with laxative No nausea.  Her appetite is somewhat reduced She denies recent pelvic bleeding. Her pelvic pain is well controlled with morphine sulfate.  She is able to reduce smoking.  Her depression is well controlled with Wellbutrin  REVIEW OF SYSTEMS:   Constitutional: Denies fevers, chills or abnormal weight loss Eyes: Denies blurriness of vision Ears, nose, mouth, throat, and face: Denies mucositis or sore throat Respiratory: Denies cough, dyspnea or wheezes Cardiovascular: Denies palpitation, chest discomfort or lower extremity swelling Skin: Denies abnormal skin rashes Lymphatics: Denies new lymphadenopathy or easy bruising Neurological:Denies numbness, tingling or new weaknesses Behavioral/Psych: Mood is stable, no new changes  All other systems were reviewed with the patient and are negative.  I have reviewed the past medical history, past surgical history, social history and family history with the patient and they are unchanged from previous note.  ALLERGIES:  is allergic to amoxicillin and penicillins.  MEDICATIONS:  Current Outpatient Prescriptions  Medication Sig Dispense Refill  . buPROPion (WELLBUTRIN) 100 MG tablet Take 1 tablet (100 mg total) by mouth 2 (two) times  daily. 60 tablet 1  . lidocaine-prilocaine (EMLA) cream Apply to affected area once 30 g 3  . LORazepam (ATIVAN) 0.5 MG tablet Take 1 tablet (0.5 mg total) by mouth every 8  (eight) hours. As Needed for anxiety or nausea 30 tablet 0  . morphine (MSIR) 15 MG tablet Take 1 tablet (15 mg total) by mouth every 6 (six) hours as needed for severe pain. 60 tablet 0  . ondansetron (ZOFRAN) 8 MG tablet Take 1 tablet (8 mg total) by mouth 2 (two) times daily as needed. Start on the third day after chemotherapy. 30 tablet 1  . prochlorperazine (COMPAZINE) 10 MG tablet Take 1 tablet (10 mg total) by mouth every 6 (six) hours as needed (Nausea or vomiting). 30 tablet 1   No current facility-administered medications for this visit.    Facility-Administered Medications Ordered in Other Visits  Medication Dose Route Frequency Provider Last Rate Last Dose  . 0.9 %  sodium chloride infusion   Intravenous Once Alvy Bimler, Reo Portela, MD      . heparin lock flush 100 unit/mL  500 Units Intracatheter Once PRN Alvy Bimler, Daphnee Preiss, MD      . sodium chloride flush (NS) 0.9 % injection 10 mL  10 mL Intracatheter PRN Alvy Bimler, Pama Roskos, MD        PHYSICAL EXAMINATION: ECOG PERFORMANCE STATUS: 1 - Symptomatic but completely ambulatory GENERAL:alert, no distress and comfortable SKIN: skin color, texture, turgor are normal, no rashes or significant lesions EYES: normal, Conjunctiva are pink and non-injected, sclera clear Musculoskeletal:no cyanosis of digits and no clubbing  NEURO: alert & oriented x 3 with fluent speech, no focal motor/sensory deficits  LABORATORY DATA:  I have reviewed the data as listed    Component Value Date/Time   NA 138 11/02/2016 1241   K 3.9 11/02/2016 1241   CL 103 10/29/2016 1322   CO2 27 11/02/2016 1241   GLUCOSE 106 11/02/2016 1241   BUN 6.7 (L) 11/02/2016 1241   CREATININE 0.9 11/02/2016 1241   CALCIUM 9.7 11/02/2016 1241   PROT 7.5 11/02/2016 1241   ALBUMIN 3.8 11/02/2016 1241   AST 11 11/02/2016 1241   ALT 11 11/02/2016 1241   ALKPHOS 62 11/02/2016 1241   BILITOT 0.39 11/02/2016 1241   GFRNONAA >60 10/29/2016 1322   GFRAA >60 10/29/2016 1322    No results found  for: SPEP, UPEP  Lab Results  Component Value Date   WBC 7.4 11/02/2016   NEUTROABS 4.5 11/02/2016   HGB 13.5 11/02/2016   HCT 40.3 11/02/2016   MCV 85.1 11/02/2016   PLT 363 11/02/2016      Chemistry      Component Value Date/Time   NA 138 11/02/2016 1241   K 3.9 11/02/2016 1241   CL 103 10/29/2016 1322   CO2 27 11/02/2016 1241   BUN 6.7 (L) 11/02/2016 1241   CREATININE 0.9 11/02/2016 1241      Component Value Date/Time   CALCIUM 9.7 11/02/2016 1241   ALKPHOS 62 11/02/2016 1241   AST 11 11/02/2016 1241   ALT 11 11/02/2016 1241   BILITOT 0.39 11/02/2016 1241       RADIOGRAPHIC STUDIES: I have personally reviewed the radiological images as listed and agreed with the findings in the report. Nm Pet Image Initial (pi) Skull Base To Thigh  Result Date: 11/01/2016 CLINICAL DATA:  Initial treatment strategy for cervical cancer with enlarged lymph nodes. EXAM: NUCLEAR MEDICINE PET SKULL BASE TO THIGH TECHNIQUE: 7.2 mCi F-18 FDG was injected intravenously. Full-ring  PET imaging was performed from the skull base to thigh after the radiotracer. CT data was obtained and used for attenuation correction and anatomic localization. FASTING BLOOD GLUCOSE:  Value: 109 mg/dl COMPARISON:  CT scan 10/05/2016 FINDINGS: NECK Relatively symmetric tonsillar activity most likely physiologic. Similar physiologic activity in the glottis. A lymph node at the border of level IV and the supraclavicular chain on the left measuring 9 mm in short axis on image 37/4 news mildly but abnormally hypermetabolic with maximum SUV 4.6. CHEST No hypermetabolic mediastinal or hilar nodes. No suspicious pulmonary nodules on the CT dated. Right subclavian catheter tip in the right atrium, small amount of gas adjacent to the catheter likely related to recent placement. Background mediastinal blood pool activity 2.4. ABDOMEN/PELVIS Hypermetabolic bilateral common iliac chain adenopathy with small hypermetabolic foci also along  the margins of both ovaries. Intense hypermetabolic activity in the cervix, somewhat difficult to measure due to the proximity of the urinary bladder, but with maximum SUV of approximately 17.5 Inferior aortocaval lymph node measuring 8 mm in short axis on image 123/4 is not appreciably hypermetabolic. An index right common iliac node measuring 1.1 cm in short axis on image 138/4 has a maximum SUV of 8.1. The other lymph nodes in this vicinity have similar activity. Indistinct activity along the periphery of the left ovary appears to be separate from the ureter, and has a maximum standard uptake value of 6.5. Activity along the upper medial margin of the right ovary is thought to probably relate to adjacent loops of collapsed bowel. There is mild activity along the perineum likely from mild incontinence. SKELETON No focal hypermetabolic activity to suggest skeletal metastasis. 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 IMPRESSION: 1. Intensely hypermetabolic cervical mass, with hypermetabolic adenopathy in the common iliac chains, as well as a faintly but abnormally hypermetabolic left supraclavicular lymph node. 2. There is some indistinct accentuated activity along the periphery of the left ovary with maximum SUV 6.5, if the ovaries not removed in the context of therapy for the cervical cancer, surveillance would likely be indicated. 3. 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 Electronically Signed   By: Van Clines M.D.   On: 11/01/2016 15:50   Ir US Guide Vasc Access Right  Result Date: 10/29/2016 CLINICAL DATA:  Cervical carcinoma, access for chemotherapy EXAM: RIGHT INTERNAL JUGULAR SINGLE LUMEN POWER PORT CATHETER INSERTION Date:  5/4/20185/09/2016 5:09 pm Radiologist:  M. Daryll Brod, MD Guidance:  Ultrasound and fluoroscopic MEDICATIONS: 1 g vancomycin; The antibiotic was administered within an appropriate time interval prior to skin puncture.  ANESTHESIA/SEDATION: Versed 3.0 mg IV; Fentanyl 100 mcg IV; Moderate Sedation Time:  30 minutes The patient was continuously monitored during the procedure by the interventional radiology nurse under my direct supervision. FLUOROSCOPY TIME:  36 seconds (3.0 mGy) COMPLICATIONS: None immediate. CONTRAST:  None. PROCEDURE: Informed consent was obtained from the patient following explanation of the procedure, risks, benefits and alternatives. The patient understands, agrees and consents for the procedure. All questions were addressed. A time out was performed. Maximal barrier sterile technique utilized including caps, mask, sterile gowns, sterile gloves, large sterile drape, hand hygiene, and 2% chlorhexidine scrub. Under sterile conditions and local anesthesia, right internal jugular micropuncture venous access was performed. Access was performed with ultrasound. Images were obtained for documentation. A guide wire was inserted followed by a transitional dilator. This allowed insertion of a guide wire and catheter into the IVC. Measurements were obtained from the  SVC / RA junction back to the right IJ venotomy site. In the right infraclavicular chest, a subcutaneous pocket was created over the second anterior rib. This was done under sterile conditions and local anesthesia. 1% lidocaine with epinephrine was utilized for this. A 2.5 cm incision was made in the skin. Blunt dissection was performed to create a subcutaneous pocket over the right pectoralis major muscle. The pocket was flushed with saline vigorously. There was adequate hemostasis. The port catheter was assembled and checked for leakage. The port catheter was secured in the pocket with two retention sutures. The tubing was tunneled subcutaneously to the right venotomy site and inserted into the SVC/RA junction through a valved peel-away sheath. Position was confirmed with fluoroscopy. Images were obtained for documentation. The patient tolerated the  procedure well. No immediate complications. Incisions were closed in a two layer fashion with 4 - 0 Vicryl suture. Dermabond was applied to the skin. The port catheter was accessed, blood was aspirated followed by saline and heparin flushes. Needle was removed. A dry sterile dressing was applied. IMPRESSION: Ultrasound and fluoroscopically guided right internal jugular single lumen power port catheter insertion. Tip in the SVC/RA junction. Catheter ready for use. Electronically Signed   By: Jerilynn Mages.  Shick M.D.   On: 10/29/2016 17:13   Ir Fluoro Guide Port Insertion Right  Result Date: 10/29/2016 CLINICAL DATA:  Cervical carcinoma, access for chemotherapy EXAM: RIGHT INTERNAL JUGULAR SINGLE LUMEN POWER PORT CATHETER INSERTION Date:  5/4/20185/09/2016 5:09 pm Radiologist:  M. Daryll Brod, MD Guidance:  Ultrasound and fluoroscopic MEDICATIONS: 1 g vancomycin; The antibiotic was administered within an appropriate time interval prior to skin puncture. ANESTHESIA/SEDATION: Versed 3.0 mg IV; Fentanyl 100 mcg IV; Moderate Sedation Time:  30 minutes The patient was continuously monitored during the procedure by the interventional radiology nurse under my direct supervision. FLUOROSCOPY TIME:  36 seconds (3.0 mGy) COMPLICATIONS: None immediate. CONTRAST:  None. PROCEDURE: Informed consent was obtained from the patient following explanation of the procedure, risks, benefits and alternatives. The patient understands, agrees and consents for the procedure. All questions were addressed. A time out was performed. Maximal barrier sterile technique utilized including caps, mask, sterile gowns, sterile gloves, large sterile drape, hand hygiene, and 2% chlorhexidine scrub. Under sterile conditions and local anesthesia, right internal jugular micropuncture venous access was performed. Access was performed with ultrasound. Images were obtained for documentation. A guide wire was inserted followed by a transitional dilator. This allowed  insertion of a guide wire and catheter into the IVC. Measurements were obtained from the SVC / RA junction back to the right IJ venotomy site. In the right infraclavicular chest, a subcutaneous pocket was created over the second anterior rib. This was done under sterile conditions and local anesthesia. 1% lidocaine with epinephrine was utilized for this. A 2.5 cm incision was made in the skin. Blunt dissection was performed to create a subcutaneous pocket over the right pectoralis major muscle. The pocket was flushed with saline vigorously. There was adequate hemostasis. The port catheter was assembled and checked for leakage. The port catheter was secured in the pocket with two retention sutures. The tubing was tunneled subcutaneously to the right venotomy site and inserted into the SVC/RA junction through a valved peel-away sheath. Position was confirmed with fluoroscopy. Images were obtained for documentation. The patient tolerated the procedure well. No immediate complications. Incisions were closed in a two layer fashion with 4 - 0 Vicryl suture. Dermabond was applied to the skin. The port catheter was accessed,  blood was aspirated followed by saline and heparin flushes. Needle was removed. A dry sterile dressing was applied. IMPRESSION: Ultrasound and fluoroscopically guided right internal jugular single lumen power port catheter insertion. Tip in the SVC/RA junction. Catheter ready for use. Electronically Signed   By: Jerilynn Mages.  Shick M.D.   On: 10/29/2016 17:13    ASSESSMENT & PLAN:  Cervical cancer ([place]) The patient is ready to proceed with treatment. She continues to have mild pelvic pain, well controlled with current prescription pain medicine.  We will proceed with treatment and will continue to see her on a weekly basis with supportive care  Cancer associated pain Her pelvic pain is well controlled with current prescription morphine sulfate. I refill her prescription today I will assess her pain  control in her next visit I warned her of risk of nausea and sedation with pain medicine  Other constipation The patient had recent constipation, likely due to pain and her disease We discussed the importance of laxative therapy  Depression with anxiety She has signs and symptoms of anxiety and depression She is not suicidal She felt that Wellbutrin is giving her a sense of well-being.  She will continue the same.  Tobacco abuse she appears motivated to quit. Wellbutrin is helping and she is smoking less.  I congratulated her efforts   No orders of the defined types were placed in this encounter.  All questions were answered. The patient knows to call the clinic with any problems, questions or concerns. No barriers to learning was detected. I spent 15 minutes counseling the patient face to face. The total time spent in the appointment was 20 minutes and more than 50% was on counseling and review of test results     Heath Lark, MD 11/09/2016 8:51 AM

## 2016-11-09 NOTE — Assessment & Plan Note (Signed)
The patient is ready to proceed with treatment. She continues to have mild pelvic pain, well controlled with current prescription pain medicine.  We will proceed with treatment and will continue to see her on a weekly basis with supportive care

## 2016-11-09 NOTE — Progress Notes (Signed)
[facility] Spiritual Care Note  Saw Joanne Griffin's distress in chemo ed, but didn't have opportunity to speak with her personally.  Followed up in infusion today, meeting her mom Joanne Griffin) and her dad Joanne Griffin, but on the road a lot).  Joanne Griffin recognized me and talked in more detail about programs that interest her.  She presented as optimistic overall, citing relief that first tx was going smoothly.  She touched on grief at loss of fertility without getting to have children, becoming tearful.  Having a close relationship with her 82yo niece Joanne Griffin) brings some consolation.  Normalized feelings, offering ongoing chaplain availability for support if she would like to go deeper.  Joanne Griffin and parents are aware of [facility] team/resources and know to inquire anytime.  Joanne Griffin welcomed me back, so I plan to f/u at her next infusion (5/30).  Please also page if immediate needs arise.  Thank you.   [place], [place], [facility] Pager [phone] Voicemail [phone]

## 2016-11-09 NOTE — Telephone Encounter (Signed)
-----   Message from Thomasene Lot, RN sent at 11/09/2016  2:35 PM EDT ----- Regarding: Dr Alvy Bimler 1st TX f/u call 1st tx f/u call

## 2016-11-09 NOTE — Telephone Encounter (Signed)
Scheduled appt per 5/15 los. Gave patient AVS and calender per 5/15 los.  

## 2016-11-09 NOTE — Progress Notes (Signed)
  Radiation Oncology         [phone] ________________________________  Name: Joanne Griffin MRN: [id number]  Date: 11/02/2016  DOB: [birth year]  SIMULATION AND TREATMENT PLANNING NOTE    ICD-9-CM ICD-10-CM   1. Malignant neoplasm of endocervix (HCC) 180.0 C53.0     DIAGNOSIS:  31 year-old with invasive adenocarcinoma of the cervix, clinical stage IB2 adenocarcinoma cervix with radiographically suspicious pelvic lymphadenopathy And left supraclavicular adenopathy   NARRATIVE:  The patient was brought to the [facility].  Identity was confirmed.  All relevant records and images related to the planned course of therapy were reviewed.  The patient freely provided informed written consent to proceed with treatment after reviewing the details related to the planned course of therapy. The consent form was witnessed and verified by the simulation staff.  Then, the patient was set-up in a stable reproducible  supine position for radiation therapy.  CT images were obtained.  Surface markings were placed.  The CT images were loaded into the planning software.  Then the target and avoidance structures were contoured.  Treatment planning then occurred.  The radiation prescription was entered and confirmed.  Then, I designed and supervised the construction of a total of 2 medically necessary complex treatment devices.  I have requested : Intensity Modulated Radiotherapy (IMRT) is medically necessary for this case for the following reason:  Kidney sparing.  I have ordered:dose calc.  PLAN:  The patient will receive 45 Gy in 25 fractions. This will then be followed by a boost to the  lymphadenopathy in the upper pelvis region. In addition the patient will receive 5 intracavitary brachytherapy treatments using iridium 192 as the high-dose-rate source. She will also receive radiation treatments to the left supraclavicular region which is dictated in a separate simulation  note.  -----------------------------------  Blair Promise, PhD, MD

## 2016-11-09 NOTE — Assessment & Plan Note (Signed)
The patient had recent constipation, likely due to pain and her disease We discussed the importance of laxative therapy

## 2016-11-09 NOTE — Assessment & Plan Note (Signed)
Her pelvic pain is well controlled with current prescription morphine sulfate. I refill her prescription today I will assess her pain control in her next visit I warned her of risk of nausea and sedation with pain medicine

## 2016-11-09 NOTE — Progress Notes (Signed)
  Radiation Oncology         [phone] ________________________________  Name: Joanne Griffin MRN: [id number]  Date: 11/09/2016  DOB: [birth year]  Simulation Verification Note    ICD-9-CM ICD-10-CM   1. Malignant neoplasm of endocervix ([place]) 180.0 C53.0     Status: outpatient  NARRATIVE: The patient was brought to the treatment unit and placed in the planned treatment position. The clinical setup was verified. Then port films were obtained and uploaded to the radiation oncology medical record software.  The treatment beams were carefully compared against the planned radiation fields. The position location and shape of the radiation fields was reviewed. They targeted volume of tissue appears to be appropriately covered by the radiation beams. Organs at risk appear to be excluded as planned.  Based on my personal review, I approved the simulation verification. The patient's treatment will proceed as planned.  -----------------------------------  Blair Promise, PhD, MD

## 2016-11-09 NOTE — Patient Instructions (Signed)
[facility] Discharge Instructions for Patients Receiving Chemotherapy  Today you received the following chemotherapy agents Cisplatin  To help prevent nausea and vomiting after your treatment, we encourage you to take your nausea medication as prescribed.  If you develop nausea and vomiting that is not controlled by your nausea medication, call the clinic.   BELOW ARE SYMPTOMS THAT SHOULD BE REPORTED IMMEDIATELY:  *FEVER GREATER THAN 100.5 F  *CHILLS WITH OR WITHOUT FEVER  NAUSEA AND VOMITING THAT IS NOT CONTROLLED WITH YOUR NAUSEA MEDICATION  *UNUSUAL SHORTNESS OF BREATH  *UNUSUAL BRUISING OR BLEEDING  TENDERNESS IN MOUTH AND THROAT WITH OR WITHOUT PRESENCE OF ULCERS  *URINARY PROBLEMS  *BOWEL PROBLEMS  UNUSUAL RASH Items with * indicate a potential emergency and should be followed up as soon as possible.  Feel free to call the clinic you have any questions or concerns. The clinic phone number is [phone].  Please show the [facility] at check-in to the Emergency Department and triage nurse.  Cisplatin injection What is this medicine? CISPLATIN (SIS pla tin) is a chemotherapy drug. It targets fast dividing cells, like cancer cells, and causes these cells to die. This medicine is used to treat many types of cancer like bladder, ovarian, and testicular cancers. This medicine may be used for other purposes; ask your health care provider or pharmacist if you have questions. COMMON BRAND NAME(S): Platinol, Platinol -AQ What should I tell my health care provider before I take this medicine? They need to know if you have any of these conditions: -blood disorders -hearing problems -kidney disease -recent or ongoing radiation therapy -an unusual or allergic reaction to cisplatin, carboplatin, other chemotherapy, other medicines, foods, dyes, or preservatives -pregnant or trying to get pregnant -breast-feeding How should I use this medicine? This drug  is given as an infusion into a vein. It is administered in a hospital or clinic by a specially trained health care professional. Talk to your pediatrician regarding the use of this medicine in children. Special care may be needed. Overdosage: If you think you have taken too much of this medicine contact a poison control center or emergency room at once. NOTE: This medicine is only for you. Do not share this medicine with others. What if I miss a dose? It is important not to miss a dose. Call your doctor or health care professional if you are unable to keep an appointment. What may interact with this medicine? -dofetilide -foscarnet -medicines for seizures -medicines to increase blood counts like filgrastim, pegfilgrastim, sargramostim -probenecid -pyridoxine used with altretamine -rituximab -some antibiotics like amikacin, gentamicin, neomycin, polymyxin B, streptomycin, tobramycin -sulfinpyrazone -vaccines -zalcitabine Talk to your doctor or health care professional before taking any of these medicines: -acetaminophen -aspirin -ibuprofen -ketoprofen -naproxen This list may not describe all possible interactions. Give your health care provider a list of all the medicines, herbs, non-prescription drugs, or dietary supplements you use. Also tell them if you smoke, drink alcohol, or use illegal drugs. Some items may interact with your medicine. What should I watch for while using this medicine? Your condition will be monitored carefully while you are receiving this medicine. You will need important blood work done while you are taking this medicine. This drug may make you feel generally unwell. This is not uncommon, as chemotherapy can affect healthy cells as well as cancer cells. Report any side effects. Continue your course of treatment even though you feel ill unless your doctor tells you to stop. In some  cases, you may be given additional medicines to help with side effects. Follow all  directions for their use. Call your doctor or health care professional for advice if you get a fever, chills or sore throat, or other symptoms of a cold or flu. Do not treat yourself. This drug decreases your body's ability to fight infections. Try to avoid being around people who are sick. This medicine may increase your risk to bruise or bleed. Call your doctor or health care professional if you notice any unusual bleeding. Be careful brushing and flossing your teeth or using a toothpick because you may get an infection or bleed more easily. If you have any dental work done, tell your dentist you are receiving this medicine. Avoid taking products that contain aspirin, acetaminophen, ibuprofen, naproxen, or ketoprofen unless instructed by your doctor. These medicines may hide a fever. Do not become pregnant while taking this medicine. Women should inform their doctor if they wish to become pregnant or think they might be pregnant. There is a potential for serious side effects to an unborn child. Talk to your health care professional or pharmacist for more information. Do not breast-feed an infant while taking this medicine. Drink fluids as directed while you are taking this medicine. This will help protect your kidneys. Call your doctor or health care professional if you get diarrhea. Do not treat yourself. What side effects may I notice from receiving this medicine? Side effects that you should report to your doctor or health care professional as soon as possible: -allergic reactions like skin rash, itching or hives, swelling of the face, lips, or tongue -signs of infection - fever or chills, cough, sore throat, pain or difficulty passing urine -signs of decreased platelets or bleeding - bruising, pinpoint red spots on the skin, black, tarry stools, nosebleeds -signs of decreased red blood cells - unusually weak or tired, fainting spells, lightheadedness -breathing problems -changes in  hearing -gout pain -low blood counts - This drug may decrease the number of white blood cells, red blood cells and platelets. You may be at increased risk for infections and bleeding. -nausea and vomiting -pain, swelling, redness or irritation at the injection site -pain, tingling, numbness in the hands or feet -problems with balance, movement -trouble passing urine or change in the amount of urine Side effects that usually do not require medical attention (report to your doctor or health care professional if they continue or are bothersome): -changes in vision -loss of appetite -metallic taste in the mouth or changes in taste This list may not describe all possible side effects. Call your doctor for medical advice about side effects. You may report side effects to FDA at 1-800-FDA-1088. Where should I keep my medicine? This drug is given in a hospital or clinic and will not be stored at home. NOTE: This sheet is a summary. It may not cover all possible information. If you have questions about this medicine, talk to your doctor, pharmacist, or health care provider.  2018 [employer] Standard (2007-09-19 14:40:54)

## 2016-11-09 NOTE — Progress Notes (Signed)
  Radiation Oncology         [phone] ________________________________  Name: Lorann Tani MRN: [id number]  Date: 11/04/2016  DOB: [birth year]  SIMULATION AND TREATMENT PLANNING NOTE    ICD-9-CM ICD-10-CM   1. Malignant neoplasm of endocervix (HCC) 180.0 C53.0     DIAGNOSIS: 31 year-old with invasive adenocarcinoma of the cervix, clinical stage IB2 adenocarcinoma cervix with radiographically suspicious pelvic lymphadenopathy And left supraclavicular adenopathy   NARRATIVE:  The patient was brought to the [facility].  Identity was confirmed.  All relevant records and images related to the planned course of therapy were reviewed.  The patient freely provided informed written consent to proceed with treatment after reviewing the details related to the planned course of therapy. The consent form was witnessed and verified by the simulation staff.  Then, the patient was set-up in a stable reproducible  supine position for radiation therapy.  CT images were obtained.  Surface markings were placed.  The CT images were loaded into the planning software.  Then the target and avoidance structures were contoured.  Treatment planning then occurred.  The radiation prescription was entered and confirmed.  Then, I designed and supervised the construction of a total of 3 medically necessary complex treatment devices.  I have requested : 3D Simulation  I have requested a DVH of the following structures: GTV, PTV, lungs, spinal cord.  I have ordered:dose calc.  PLAN:  The patient will receive 60 Gy in 30 fractions directed at the left supraclavicular region. May consider changing the prescription and stopping after 30 gray with a photon field and boosting with an electron field.  -----------------------------------  Blair Promise, PhD, MD

## 2016-11-09 NOTE — Telephone Encounter (Signed)
Left message that we are calling to see how she is doing after treatment, instructed to call with any problems.

## 2016-11-09 NOTE — Progress Notes (Signed)
Per Dr Alvy Bimler ok to Tx with labs, BUN 6.7.

## 2016-11-10 ENCOUNTER — Ambulatory Visit
Admission: RE | Admit: 2016-11-10 | Discharge: 2016-11-10 | Disposition: A | Discharge status: Home / Self Care | Payer: Medicaid Other | Source: Ambulatory Visit | Attending: Radiation Oncology | Admitting: Radiation Oncology

## 2016-11-10 DIAGNOSIS — Z510 Encounter for antineoplastic radiation therapy: Secondary | ICD-10-CM | POA: Diagnosis not present

## 2016-11-11 ENCOUNTER — Ambulatory Visit
Admission: RE | Admit: 2016-11-11 | Discharge: 2016-11-11 | Disposition: A | Discharge status: Home / Self Care | Payer: Medicaid Other | Source: Ambulatory Visit | Attending: Radiation Oncology | Admitting: Radiation Oncology

## 2016-11-11 DIAGNOSIS — Z510 Encounter for antineoplastic radiation therapy: Secondary | ICD-10-CM | POA: Diagnosis not present

## 2016-11-12 ENCOUNTER — Ambulatory Visit
Admission: RE | Admit: 2016-11-12 | Discharge: 2016-11-12 | Disposition: A | Discharge status: Home / Self Care | Payer: Medicaid Other | Source: Ambulatory Visit | Attending: Radiation Oncology | Admitting: Radiation Oncology

## 2016-11-12 DIAGNOSIS — Z510 Encounter for antineoplastic radiation therapy: Secondary | ICD-10-CM | POA: Diagnosis not present

## 2016-11-13 ENCOUNTER — Other Ambulatory Visit: Payer: Self-pay | Admitting: Hematology and Oncology

## 2016-11-13 DIAGNOSIS — C530 Malignant neoplasm of endocervix: Secondary | ICD-10-CM

## 2016-11-14 ENCOUNTER — Encounter: Payer: Self-pay | Admitting: Hematology and Oncology

## 2016-11-15 ENCOUNTER — Ambulatory Visit
Admission: RE | Admit: 2016-11-15 | Discharge: 2016-11-15 | Disposition: A | Discharge status: Home / Self Care | Payer: Medicaid Other | Source: Ambulatory Visit | Attending: Radiation Oncology | Admitting: Radiation Oncology

## 2016-11-15 ENCOUNTER — Other Ambulatory Visit (HOSPITAL_BASED_OUTPATIENT_CLINIC_OR_DEPARTMENT_OTHER): Payer: Medicaid Other

## 2016-11-15 DIAGNOSIS — Z510 Encounter for antineoplastic radiation therapy: Secondary | ICD-10-CM | POA: Diagnosis not present

## 2016-11-15 DIAGNOSIS — C530 Malignant neoplasm of endocervix: Secondary | ICD-10-CM | POA: Diagnosis present

## 2016-11-15 LAB — CBC WITH DIFFERENTIAL/PLATELET
BASO%: 0.3 % (ref 0.0–2.0)
Basophils Absolute: 0 10*3/uL (ref 0.0–0.1)
EOS%: 2.3 % (ref 0.0–7.0)
Eosinophils Absolute: 0.1 10*3/uL (ref 0.0–0.5)
HEMATOCRIT: 37.6 % (ref 34.8–46.6)
HGB: 12.7 g/dL (ref 11.6–15.9)
LYMPH%: 18.5 % (ref 14.0–49.7)
MCH: 28.8 pg (ref 25.1–34.0)
MCHC: 33.7 g/dL (ref 31.5–36.0)
MCV: 85.6 fL (ref 79.5–101.0)
MONO#: 0.3 10*3/uL (ref 0.1–0.9)
MONO%: 5.8 % (ref 0.0–14.0)
NEUT#: 3.2 10*3/uL (ref 1.5–6.5)
NEUT%: 73.1 % (ref 38.4–76.8)
PLATELETS: 320 10*3/uL (ref 145–400)
RBC: 4.39 10*6/uL (ref 3.70–5.45)
RDW: 13.1 % (ref 11.2–14.5)
WBC: 4.4 10*3/uL (ref 3.9–10.3)
lymph#: 0.8 10*3/uL — ABNORMAL LOW (ref 0.9–3.3)

## 2016-11-15 LAB — COMPREHENSIVE METABOLIC PANEL
ALK PHOS: 51 U/L (ref 40–150)
ALT: 19 U/L (ref 0–55)
AST: 12 U/L (ref 5–34)
Albumin: 3.7 g/dL (ref 3.5–5.0)
Anion Gap: 8 mEq/L (ref 3–11)
BUN: 10.6 mg/dL (ref 7.0–26.0)
CHLORIDE: 103 meq/L (ref 98–109)
CO2: 29 mEq/L (ref 22–29)
Calcium: 9 mg/dL (ref 8.4–10.4)
Creatinine: 0.8 mg/dL (ref 0.6–1.1)
GLUCOSE: 101 mg/dL (ref 70–140)
POTASSIUM: 4.2 meq/L (ref 3.5–5.1)
Sodium: 139 mEq/L (ref 136–145)
Total Bilirubin: 0.3 mg/dL (ref 0.20–1.20)
Total Protein: 6.8 g/dL (ref 6.4–8.3)

## 2016-11-15 LAB — MAGNESIUM: MAGNESIUM: 2.3 mg/dL (ref 1.5–2.5)

## 2016-11-16 ENCOUNTER — Encounter: Payer: Self-pay | Admitting: Radiation Oncology

## 2016-11-16 ENCOUNTER — Encounter: Payer: Self-pay | Admitting: Hematology and Oncology

## 2016-11-16 ENCOUNTER — Other Ambulatory Visit: Payer: Self-pay | Admitting: Hematology and Oncology

## 2016-11-16 ENCOUNTER — Ambulatory Visit (HOSPITAL_BASED_OUTPATIENT_CLINIC_OR_DEPARTMENT_OTHER): Payer: Medicaid Other

## 2016-11-16 ENCOUNTER — Ambulatory Visit (HOSPITAL_BASED_OUTPATIENT_CLINIC_OR_DEPARTMENT_OTHER): Payer: Medicaid Other | Admitting: Hematology and Oncology

## 2016-11-16 ENCOUNTER — Ambulatory Visit
Admission: RE | Admit: 2016-11-16 | Discharge: 2016-11-16 | Disposition: A | Discharge status: Home / Self Care | Payer: Medicaid Other | Source: Ambulatory Visit | Attending: Radiation Oncology | Admitting: Radiation Oncology

## 2016-11-16 ENCOUNTER — Telehealth: Payer: Self-pay | Admitting: Hematology and Oncology

## 2016-11-16 ENCOUNTER — Other Ambulatory Visit: Payer: Self-pay

## 2016-11-16 VITALS — BP 102/62 | HR 65 | Temp 98.5°F | Resp 18

## 2016-11-16 VITALS — BP 113/77 | HR 71 | Temp 98.0°F | Ht 62.0 in | Wt 147.0 lb

## 2016-11-16 DIAGNOSIS — Z95828 Presence of other vascular implants and grafts: Secondary | ICD-10-CM

## 2016-11-16 DIAGNOSIS — C530 Malignant neoplasm of endocervix: Secondary | ICD-10-CM

## 2016-11-16 DIAGNOSIS — K5909 Other constipation: Secondary | ICD-10-CM

## 2016-11-16 DIAGNOSIS — R110 Nausea: Secondary | ICD-10-CM | POA: Diagnosis not present

## 2016-11-16 DIAGNOSIS — G893 Neoplasm related pain (acute) (chronic): Secondary | ICD-10-CM | POA: Diagnosis not present

## 2016-11-16 DIAGNOSIS — C539 Malignant neoplasm of cervix uteri, unspecified: Secondary | ICD-10-CM

## 2016-11-16 DIAGNOSIS — Z5111 Encounter for antineoplastic chemotherapy: Secondary | ICD-10-CM

## 2016-11-16 DIAGNOSIS — Z720 Tobacco use: Secondary | ICD-10-CM

## 2016-11-16 DIAGNOSIS — T451X5A Adverse effect of antineoplastic and immunosuppressive drugs, initial encounter: Secondary | ICD-10-CM

## 2016-11-16 DIAGNOSIS — Z510 Encounter for antineoplastic radiation therapy: Secondary | ICD-10-CM | POA: Diagnosis not present

## 2016-11-16 MED ORDER — PALONOSETRON HCL INJECTION 0.25 MG/5ML
INTRAVENOUS | Status: AC
  Filled 2016-11-16: qty 5

## 2016-11-16 MED ORDER — POTASSIUM CHLORIDE 2 MEQ/ML IV SOLN
Freq: Once | INTRAVENOUS | Status: AC
  Administered 2016-11-16: 08:00:00 via INTRAVENOUS
  Filled 2016-11-16: qty 10

## 2016-11-16 MED ORDER — PALONOSETRON HCL INJECTION 0.25 MG/5ML
0.2500 mg | Freq: Once | INTRAVENOUS | Status: AC
  Administered 2016-11-16: 0.25 mg via INTRAVENOUS

## 2016-11-16 MED ORDER — SODIUM CHLORIDE 0.9 % IV SOLN
Freq: Once | INTRAVENOUS | Status: AC
  Administered 2016-11-16: 10:00:00 via INTRAVENOUS
  Filled 2016-11-16: qty 5

## 2016-11-16 MED ORDER — SODIUM CHLORIDE 0.9% FLUSH
10.0000 mL | INTRAVENOUS | Status: DC | PRN
  Administered 2016-11-16: 10 mL
  Filled 2016-11-16: qty 10

## 2016-11-16 MED ORDER — HEPARIN SOD (PORK) LOCK FLUSH 100 UNIT/ML IV SOLN
500.0000 [IU] | Freq: Once | INTRAVENOUS | Status: AC | PRN
  Administered 2016-11-16: 500 [IU]
  Filled 2016-11-16: qty 5

## 2016-11-16 MED ORDER — CISPLATIN CHEMO INJECTION 100MG/100ML
40.0000 mg/m2 | Freq: Once | INTRAVENOUS | Status: AC
  Administered 2016-11-16: 68 mg via INTRAVENOUS
  Filled 2016-11-16: qty 68

## 2016-11-16 MED ORDER — DEXAMETHASONE 4 MG PO TABS: ORAL_TABLET | ORAL | 1 refills | Status: DC

## 2016-11-16 MED ORDER — PROMETHAZINE HCL 25 MG/ML IJ SOLN
25.0000 mg | Freq: Once | INTRAMUSCULAR | Status: AC
  Administered 2016-11-16: 25 mg via INTRAVENOUS
  Filled 2016-11-16: qty 1

## 2016-11-16 MED ORDER — SODIUM CHLORIDE 0.9 % IV SOLN
Freq: Once | INTRAVENOUS | Status: AC
  Administered 2016-11-16: 08:00:00 via INTRAVENOUS

## 2016-11-16 NOTE — Assessment & Plan Note (Signed)
she appears motivated to quit. Wellbutrin is helping and she is smoking less.  I congratulated her efforts

## 2016-11-16 NOTE — Assessment & Plan Note (Signed)
Her pelvic pain is well controlled with current prescription morphine sulfate. I will assess her pain control in her next visit I warned her of risk of nausea and sedation with pain medicine

## 2016-11-16 NOTE — Assessment & Plan Note (Signed)
She developed significant nausea and vomiting with recent chemotherapy Her blood work is satisfactory We will proceed with treatment without dose adjustment but I will modify her antiemetics

## 2016-11-16 NOTE — Progress Notes (Signed)
[facility] OFFICE PROGRESS NOTE  Patient Care Team: Patient, No Pcp Per as PCP - General (General Practice)  SUMMARY OF ONCOLOGIC HISTORY:   Adenocarcinoma (GYN origin)   09/01/2016 Initial Diagnosis    Adenocarcinoma (GYN origin)      Cervical cancer ([place])   08/26/2016 Pathology Results    Adequacy Reason Satisfactory for evaluation, endocervical/transformation zone component PRESENT. Diagnosis ATYPICAL GLANDULAR CELLS. SEE COMMENT. COMMENT: THERE ARE GROUPS OF ATYPICAL GLANDULAR CELLS WHICH APPEAR TO BE OF ENDOCERVICAL ORIGIN. TISSUE STUDIES, SUCH AS A CURETTAGE, MAY HELP BETTER EVALUATE THE EXTENT AND SEVERITY OF THESE ATYPICAL CELLS.      09/01/2016 Initial Diagnosis    The patient reports she is not had Pap smears in the "many years". She presented initially with flank pain to the emergency room on 07/29/2016. She was treated for a UTI and had resolution of her flank pain although she continued to have suprapubic pain. She has had irregular bleeding and a copious discharge for several months. 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 Microscopic Comment Immunohistochemistry is attempted to determine endocervical vs. endometrial origin. CEA is focally positive, p16 is diffusely strongly positive, ER is weakly positive, and vimentin is positive. This profile is not specific as to origin and clinical correlation is recommended. Dr. Avis Epley has reviewed the case. The case was called to Dr. Ilda Basset on 09/06/2016      10/05/2016 Imaging    Ct abdomen: 4.7 cm heterogeneous enhancing mass in the cervix and lower uterine segment, consistent with known cervical carcinoma. Mild retroperitoneal lymphadenopathy in the inferior aortocaval space and right common iliac chain, suspicious for  metastatic disease.      10/29/2016 Procedure    Ultrasound and fluoroscopically guided right internal jugular single lumen power port catheter insertion. Tip in the SVC/RA junction. Catheter ready for use      11/09/2016 -  Radiation Therapy    She receives concurrent radiation treatment      11/09/2016 -  Chemotherapy    She receives weekly cisplatin       INTERVAL HISTORY: Please see below for problem oriented charting. She is seen in the treatment room. According to the patient, she had severe nausea and vomiting recently. She is able to keep herself adequately hydrated According to family members, she has severe constipation recently, resolved with laxative She denies hearing problems No peripheral neuropathy She continues to have vaginal bleeding intermittently She is attempting to quit smoking Her pain appears to be under good control with recent prescription morphine sulfate. She denies mucositis  REVIEW OF SYSTEMS:   Constitutional: Denies fevers, chills or abnormal weight loss Eyes: Denies blurriness of vision Ears, nose, mouth, throat, and face: Denies mucositis or sore throat Respiratory: Denies cough, dyspnea or wheezes Cardiovascular: Denies palpitation, chest discomfort or lower extremity swelling Skin: Denies abnormal skin rashes Lymphatics: Denies new lymphadenopathy or easy bruising Neurological:Denies numbness, tingling or new weaknesses Behavioral/Psych: Mood is stable, no new changes  All other systems were reviewed with the patient and are negative.  I have reviewed the past medical history, past surgical history, social history and family history with the patient and they are unchanged from previous note.  ALLERGIES:  is allergic to amoxicillin and penicillins.  MEDICATIONS:  Current Outpatient Prescriptions  Medication Sig Dispense Refill  .  buPROPion (WELLBUTRIN) 100 MG tablet Take 1 tablet (100 mg total) by mouth 2 (two) times daily. 60 tablet 1   . dexamethasone (DECADRON) 4 MG tablet Take 2 tablets daily with breakfast for 2 days after each chemo treatment, every week 30 tablet 1  . hyaluronate sodium (RADIAPLEXRX) GEL Apply 1 application topically 2 (two) times daily.    Marland Kitchen lidocaine-prilocaine (EMLA) cream Apply to affected area once 30 g 3  . LORazepam (ATIVAN) 0.5 MG tablet Take 1 tablet (0.5 mg total) by mouth every 8 (eight) hours. As Needed for anxiety or nausea 30 tablet 0  . morphine (MSIR) 15 MG tablet Take 1 tablet (15 mg total) by mouth every 6 (six) hours as needed for severe pain. 60 tablet 0  . ondansetron (ZOFRAN) 8 MG tablet Take 1 tablet (8 mg total) by mouth 2 (two) times daily as needed. Start on the third day after chemotherapy. 30 tablet 1  . prochlorperazine (COMPAZINE) 10 MG tablet TAKE 1 TABLET (10 MG TOTAL) BY MOUTH EVERY 6 (SIX) HOURS AS NEEDED (NAUSEA OR VOMITING). 30 tablet 1   No current facility-administered medications for this visit.    Facility-Administered Medications Ordered in Other Visits  Medication Dose Route Frequency Provider Last Rate Last Dose  . heparin lock flush 100 unit/mL  500 Units Intracatheter Once PRN Alvy Bimler, Noemi Bellissimo, MD      . sodium chloride flush (NS) 0.9 % injection 10 mL  10 mL Intracatheter PRN Alvy Bimler, Waunetta Riggle, MD        PHYSICAL EXAMINATION: ECOG PERFORMANCE STATUS: 1 - Symptomatic but completely ambulatory GENERAL:alert, no distress and comfortable SKIN: skin color, texture, turgor are normal, no rashes or significant lesions EYES: normal, Conjunctiva are pink and non-injected, sclera clear OROPHARYNX:no exudate, no erythema and lips, buccal mucosa, and tongue normal  NECK: supple, thyroid normal size, non-tender, without nodularity LYMPH:  no palpable lymphadenopathy in the cervical, axillary or inguinal LUNGS: clear to auscultation and percussion with normal breathing effort HEART: regular rate & rhythm and no murmurs and no lower extremity edema ABDOMEN:abdomen soft,  non-tender and normal bowel sounds Musculoskeletal:no cyanosis of digits and no clubbing  NEURO: alert & oriented x 3 with fluent speech, no focal motor/sensory deficits  LABORATORY DATA:  I have reviewed the data as listed    Component Value Date/Time   NA 139 11/15/2016 1452   K 4.2 11/15/2016 1452   CL 103 10/29/2016 1322   CO2 29 11/15/2016 1452   GLUCOSE 101 11/15/2016 1452   BUN 10.6 11/15/2016 1452   CREATININE 0.8 11/15/2016 1452   CALCIUM 9.0 11/15/2016 1452   PROT 6.8 11/15/2016 1452   ALBUMIN 3.7 11/15/2016 1452   AST 12 11/15/2016 1452   ALT 19 11/15/2016 1452   ALKPHOS 51 11/15/2016 1452   BILITOT 0.30 11/15/2016 1452   GFRNONAA >60 10/29/2016 1322   GFRAA >60 10/29/2016 1322    No results found for: SPEP, UPEP  Lab Results  Component Value Date   WBC 4.4 11/15/2016   NEUTROABS 3.2 11/15/2016   HGB 12.7 11/15/2016   HCT 37.6 11/15/2016   MCV 85.6 11/15/2016   PLT 320 11/15/2016      Chemistry      Component Value Date/Time   NA 139 11/15/2016 1452   K 4.2 11/15/2016 1452   CL 103 10/29/2016 1322   CO2 29 11/15/2016 1452   BUN 10.6 11/15/2016 1452   CREATININE 0.8 11/15/2016 1452      Component Value Date/Time  CALCIUM 9.0 11/15/2016 1452   ALKPHOS 51 11/15/2016 1452   AST 12 11/15/2016 1452   ALT 19 11/15/2016 1452   BILITOT 0.30 11/15/2016 1452       RADIOGRAPHIC STUDIES: I have personally reviewed the radiological images as listed and agreed with the findings in the report. Nm Pet Image Initial (pi) Skull Base To Thigh  Result Date: 11/01/2016 CLINICAL DATA:  Initial treatment strategy for cervical cancer with enlarged lymph nodes. EXAM: NUCLEAR MEDICINE PET SKULL BASE TO THIGH TECHNIQUE: 7.2 mCi F-18 FDG was injected intravenously. Full-ring PET imaging was performed from the skull base to thigh after the radiotracer. CT data was obtained and used for attenuation correction and anatomic localization. FASTING BLOOD GLUCOSE:  Value: 109  mg/dl COMPARISON:  CT scan 10/05/2016 FINDINGS: NECK Relatively symmetric tonsillar activity most likely physiologic. Similar physiologic activity in the glottis. A lymph node at the border of level IV and the supraclavicular chain on the left measuring 9 mm in short axis on image 37/4 news mildly but abnormally hypermetabolic with maximum SUV 4.6. CHEST No hypermetabolic mediastinal or hilar nodes. No suspicious pulmonary nodules on the CT dated. Right subclavian catheter tip in the right atrium, small amount of gas adjacent to the catheter likely related to recent placement. Background mediastinal blood pool activity 2.4. ABDOMEN/PELVIS Hypermetabolic bilateral common iliac chain adenopathy with small hypermetabolic foci also along the margins of both ovaries. Intense hypermetabolic activity in the cervix, somewhat difficult to measure due to the proximity of the urinary bladder, but with maximum SUV of approximately 17.5 Inferior aortocaval lymph node measuring 8 mm in short axis on image 123/4 is not appreciably hypermetabolic. An index right common iliac node measuring 1.1 cm in short axis on image 138/4 has a maximum SUV of 8.1. The other lymph nodes in this vicinity have similar activity. Indistinct activity along the periphery of the left ovary appears to be separate from the ureter, and has a maximum standard uptake value of 6.5. Activity along the upper medial margin of the right ovary is thought to probably relate to adjacent loops of collapsed bowel. There is mild activity along the perineum likely from mild incontinence. SKELETON No focal hypermetabolic activity to suggest skeletal metastasis. 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 IMPRESSION: 1. Intensely hypermetabolic cervical mass, with hypermetabolic adenopathy in the common iliac chains, as well as a faintly but abnormally hypermetabolic left supraclavicular lymph node. 2. There is some indistinct accentuated activity  along the periphery of the left ovary with maximum SUV 6.5, if the ovaries not removed in the context of therapy for the cervical cancer, surveillance would likely be indicated. 3. 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 Electronically Signed   By: Van Clines M.D.   On: 11/01/2016 15:50   Ir US Guide Vasc Access Right  Result Date: 10/29/2016 CLINICAL DATA:  Cervical carcinoma, access for chemotherapy EXAM: RIGHT INTERNAL JUGULAR SINGLE LUMEN POWER PORT CATHETER INSERTION Date:  5/4/20185/09/2016 5:09 pm Radiologist:  M. Daryll Brod, MD Guidance:  Ultrasound and fluoroscopic MEDICATIONS: 1 g vancomycin; The antibiotic was administered within an appropriate time interval prior to skin puncture. ANESTHESIA/SEDATION: Versed 3.0 mg IV; Fentanyl 100 mcg IV; Moderate Sedation Time:  30 minutes The patient was continuously monitored during the procedure by the interventional radiology nurse under my direct supervision. FLUOROSCOPY TIME:  36 seconds (3.0 mGy) COMPLICATIONS: None immediate. CONTRAST:  None. PROCEDURE: Informed consent was obtained from the patient following explanation of  the procedure, risks, benefits and alternatives. The patient understands, agrees and consents for the procedure. All questions were addressed. A time out was performed. Maximal barrier sterile technique utilized including caps, mask, sterile gowns, sterile gloves, large sterile drape, hand hygiene, and 2% chlorhexidine scrub. Under sterile conditions and local anesthesia, right internal jugular micropuncture venous access was performed. Access was performed with ultrasound. Images were obtained for documentation. A guide wire was inserted followed by a transitional dilator. This allowed insertion of a guide wire and catheter into the IVC. Measurements were obtained from the SVC / RA junction back to the right IJ venotomy site. In the right infraclavicular chest, a subcutaneous pocket was created over  the second anterior rib. This was done under sterile conditions and local anesthesia. 1% lidocaine with epinephrine was utilized for this. A 2.5 cm incision was made in the skin. Blunt dissection was performed to create a subcutaneous pocket over the right pectoralis major muscle. The pocket was flushed with saline vigorously. There was adequate hemostasis. The port catheter was assembled and checked for leakage. The port catheter was secured in the pocket with two retention sutures. The tubing was tunneled subcutaneously to the right venotomy site and inserted into the SVC/RA junction through a valved peel-away sheath. Position was confirmed with fluoroscopy. Images were obtained for documentation. The patient tolerated the procedure well. No immediate complications. Incisions were closed in a two layer fashion with 4 - 0 Vicryl suture. Dermabond was applied to the skin. The port catheter was accessed, blood was aspirated followed by saline and heparin flushes. Needle was removed. A dry sterile dressing was applied. IMPRESSION: Ultrasound and fluoroscopically guided right internal jugular single lumen power port catheter insertion. Tip in the SVC/RA junction. Catheter ready for use. Electronically Signed   By: Jerilynn Mages.  Shick M.D.   On: 10/29/2016 17:13   Ir Fluoro Guide Port Insertion Right  Result Date: 10/29/2016 CLINICAL DATA:  Cervical carcinoma, access for chemotherapy EXAM: RIGHT INTERNAL JUGULAR SINGLE LUMEN POWER PORT CATHETER INSERTION Date:  5/4/20185/09/2016 5:09 pm Radiologist:  M. Daryll Brod, MD Guidance:  Ultrasound and fluoroscopic MEDICATIONS: 1 g vancomycin; The antibiotic was administered within an appropriate time interval prior to skin puncture. ANESTHESIA/SEDATION: Versed 3.0 mg IV; Fentanyl 100 mcg IV; Moderate Sedation Time:  30 minutes The patient was continuously monitored during the procedure by the interventional radiology nurse under my direct supervision. FLUOROSCOPY TIME:  36 seconds  (3.0 mGy) COMPLICATIONS: None immediate. CONTRAST:  None. PROCEDURE: Informed consent was obtained from the patient following explanation of the procedure, risks, benefits and alternatives. The patient understands, agrees and consents for the procedure. All questions were addressed. A time out was performed. Maximal barrier sterile technique utilized including caps, mask, sterile gowns, sterile gloves, large sterile drape, hand hygiene, and 2% chlorhexidine scrub. Under sterile conditions and local anesthesia, right internal jugular micropuncture venous access was performed. Access was performed with ultrasound. Images were obtained for documentation. A guide wire was inserted followed by a transitional dilator. This allowed insertion of a guide wire and catheter into the IVC. Measurements were obtained from the SVC / RA junction back to the right IJ venotomy site. In the right infraclavicular chest, a subcutaneous pocket was created over the second anterior rib. This was done under sterile conditions and local anesthesia. 1% lidocaine with epinephrine was utilized for this. A 2.5 cm incision was made in the skin. Blunt dissection was performed to create a subcutaneous pocket over the right pectoralis major muscle. The  pocket was flushed with saline vigorously. There was adequate hemostasis. The port catheter was assembled and checked for leakage. The port catheter was secured in the pocket with two retention sutures. The tubing was tunneled subcutaneously to the right venotomy site and inserted into the SVC/RA junction through a valved peel-away sheath. Position was confirmed with fluoroscopy. Images were obtained for documentation. The patient tolerated the procedure well. No immediate complications. Incisions were closed in a two layer fashion with 4 - 0 Vicryl suture. Dermabond was applied to the skin. The port catheter was accessed, blood was aspirated followed by saline and heparin flushes. Needle was removed.  A dry sterile dressing was applied. IMPRESSION: Ultrasound and fluoroscopically guided right internal jugular single lumen power port catheter insertion. Tip in the SVC/RA junction. Catheter ready for use. Electronically Signed   By: Jerilynn Mages.  Shick M.D.   On: 10/29/2016 17:13    ASSESSMENT & PLAN:  Cervical cancer ([place]) She developed significant nausea and vomiting with recent chemotherapy Her blood work is satisfactory We will proceed with treatment without dose adjustment but I will modify her antiemetics  Chemotherapy-induced nausea She has multifactorial nausea likely due to chemotherapy and recent severe constipation In addition to her current anti-emetics, I would also add extra IV fluids for the next few days along with dexamethasone 8 mg daily for next 2 days after each treatment We have extensive discussion about laxative therapy  Cancer associated pain Her pelvic pain is well controlled with current prescription morphine sulfate. I will assess her pain control in her next visit I warned her of risk of nausea and sedation with pain medicine  Other constipation The patient had recent constipation, likely due to pain and her disease We discussed the importance of laxative therapy  Tobacco abuse she appears motivated to quit. Wellbutrin is helping and she is smoking less.  I congratulated her efforts   No orders of the defined types were placed in this encounter.  All questions were answered. The patient knows to call the clinic with any problems, questions or concerns. No barriers to learning was detected. I spent 25 minutes counseling the patient face to face. The total time spent in the appointment was 30 minutes and more than 50% was on counseling and review of test results     Heath Lark, MD 11/16/2016 1:28 PM

## 2016-11-16 NOTE — Telephone Encounter (Signed)
Gave patient AVS and calender per 5/22 - IVF scheduled at [facility] by Andria Frames

## 2016-11-16 NOTE — Assessment & Plan Note (Signed)
She has multifactorial nausea likely due to chemotherapy and recent severe constipation In addition to her current anti-emetics, I would also add extra IV fluids for the next few days along with dexamethasone 8 mg daily for next 2 days after each treatment We have extensive discussion about laxative therapy

## 2016-11-16 NOTE — Assessment & Plan Note (Signed)
The patient had recent constipation, likely due to pain and her disease We discussed the importance of laxative therapy

## 2016-11-16 NOTE — Patient Instructions (Signed)
[facility] Discharge Instructions for Patients Receiving Chemotherapy  Today you received the following chemotherapy agents: Cisplatin   To help prevent nausea and vomiting after your treatment, we encourage you to take your nausea medication as directed.    If you develop nausea and vomiting that is not controlled by your nausea medication, call the clinic.   BELOW ARE SYMPTOMS THAT SHOULD BE REPORTED IMMEDIATELY:  *FEVER GREATER THAN 100.5 F  *CHILLS WITH OR WITHOUT FEVER  NAUSEA AND VOMITING THAT IS NOT CONTROLLED WITH YOUR NAUSEA MEDICATION  *UNUSUAL SHORTNESS OF BREATH  *UNUSUAL BRUISING OR BLEEDING  TENDERNESS IN MOUTH AND THROAT WITH OR WITHOUT PRESENCE OF ULCERS  *URINARY PROBLEMS  *BOWEL PROBLEMS  UNUSUAL RASH Items with * indicate a potential emergency and should be followed up as soon as possible.  Feel free to call the clinic you have any questions or concerns. The clinic phone number is [phone].  Please show the CHEMO ALERT CARD at check-in to the Emergency Department and triage nurse.   

## 2016-11-16 NOTE — Progress Notes (Signed)
  Radiation Oncology         [phone] ________________________________  Name: Joanne Griffin MRN: [id number]  Date: 11/16/2016  DOB: 1986/04/11  Weekly Radiation Therapy Management    ICD-9-CM ICD-10-CM   1. Malignant neoplasm of endocervix (HCC) 180.0 C53.0      Current Dose: 10.8 Gy     Planned Dose:  54+ Gy  Narrative . . . . . . . . The patient presents for routine under treatment assessment. Accompanied by her father and mother today.                                   Joanne Griffin has completed 6 fractions to her cervix and left supraclav. area.  She denies having pain now and takes Morphine 15 mg three times a day.  She had chemotherapy today.  She reports having nausea all the time.  She vomited Saturday, last night and this morning.  She denies having any urinary issues.  She reports having both constipation and diarrhea.  She is very sleepy as she was given phenergan during chemo today.  She does report having some vaginal bleeding since yesterday. She may be starting her monthly cycle.   She is using radiaplex as directed.                                   Set-up films were reviewed.                                  The chart was checked. Physical Findings. . .  height is 5\' 2"  (1.575 m) and weight is 147 lb (66.7 kg). Her oral temperature is 98 F (36.7 C). Her blood pressure is 113/77 and her pulse is 71. Her oxygen saturation is 100%. . The lungs are clear. The heart has a regular rhythm and rate. The abdomen is soft and nontender. Patient is somewhat lethargic in light of her Benadryl medication earlier today. No significant radiation reaction in the left supraclavicular region. Impression . . . . . . . The patient is tolerating radiation. Plan . . . . . . . . . . . . Continue treatment as planned.  ________________________________   Blair Promise, PhD, MD

## 2016-11-16 NOTE — Progress Notes (Signed)
Joanne Griffin has completed 6 fractions to her cervix and left subclavian area.  She denies having pain now and takes Morphine 15 mg three times a day.  She had chemotherapy today.  She reports having nausea all the time.  She vomited Saturday, last night and this morning.  She denies having any urinary issues.  She reports having both constipation and diarrhea.  She is very sleepy as she was given phenergan during chemo today.  She does report having some vaginal bleeding since yesterday.  The skin on her left subclavian area is intact.  She is using radiaplex as directed.    BP 113/77 (BP Location: Left Arm, Patient Position: Sitting)   Pulse 71   Temp 98 F (36.7 C) (Oral)   Ht 5\' 2"  (1.575 m)   Wt 147 lb (66.7 kg)   LMP 10/23/2016 (Exact Date)   SpO2 100%   BMI 26.89 kg/m    Wt Readings from Last 3 Encounters:  11/16/16 147 lb (66.7 kg)  10/29/16 142 lb 6 oz (64.6 kg)  10/27/16 142 lb 6.4 oz (64.6 kg)

## 2016-11-16 NOTE — Progress Notes (Signed)
Pt reports Nausea and Vomiting, and on and off diarrhea and constipation. Dr. Alvy Bimler aware and reviewed plans with pt and patient's mother. Pt to return to clinic for two days post chemo treatment for IVfs and PO decadron to be taken at home for two days post chemo. Pt verbalizes understanding.   1435: Pt reports nausea, Per Dr. Alvy Bimler okay to give pt Phenergan 25 mg IV, pt to be discharged and report to Radiation and pt states she will not be driving home. Patient's mother and father present and state they will be driving pt. Pt stable at discharge.

## 2016-11-17 ENCOUNTER — Ambulatory Visit (HOSPITAL_COMMUNITY)
Admission: RE | Admit: 2016-11-17 | Discharge: 2016-11-17 | Disposition: A | Discharge status: Home / Self Care | Payer: Medicaid Other | Source: Ambulatory Visit | Attending: Hematology and Oncology | Admitting: Hematology and Oncology

## 2016-11-17 ENCOUNTER — Ambulatory Visit
Admission: RE | Admit: 2016-11-17 | Discharge: 2016-11-17 | Disposition: A | Discharge status: Home / Self Care | Payer: Medicaid Other | Source: Ambulatory Visit | Attending: Radiation Oncology | Admitting: Radiation Oncology

## 2016-11-17 ENCOUNTER — Other Ambulatory Visit: Payer: Self-pay | Admitting: *Deleted

## 2016-11-17 DIAGNOSIS — Z510 Encounter for antineoplastic radiation therapy: Secondary | ICD-10-CM | POA: Diagnosis not present

## 2016-11-17 DIAGNOSIS — C539 Malignant neoplasm of cervix uteri, unspecified: Secondary | ICD-10-CM | POA: Diagnosis present

## 2016-11-17 MED ORDER — HEPARIN SOD (PORK) LOCK FLUSH 100 UNIT/ML IV SOLN
500.0000 [IU] | INTRAVENOUS | Status: AC | PRN
  Administered 2016-11-17: 500 [IU]
  Filled 2016-11-17: qty 5

## 2016-11-17 MED ORDER — SODIUM CHLORIDE 0.9 % IV SOLN
Freq: Once | INTRAVENOUS | Status: AC
  Administered 2016-11-17: 13:00:00 via INTRAVENOUS

## 2016-11-17 MED ORDER — PROMETHAZINE HCL 25 MG/ML IJ SOLN
25.0000 mg | Freq: Once | INTRAMUSCULAR | Status: AC
  Administered 2016-11-17: 25 mg via INTRAVENOUS
  Filled 2016-11-17: qty 1

## 2016-11-17 MED ORDER — SODIUM CHLORIDE 0.9% FLUSH
10.0000 mL | INTRAVENOUS | Status: AC | PRN
  Administered 2016-11-17: 10 mL

## 2016-11-17 NOTE — Discharge Instructions (Signed)
Pt received 1 liter of normal saline over 2 hours today. °

## 2016-11-17 NOTE — Progress Notes (Signed)
Diagnosis: Cervical cancer [facility]) [C53.9]  Provider: Dr. Alvy Bimler  Procedure: Pt received 1 liter of normal saline over 2hours and also received 25mg  of Phenergan IV.  Pt tolerated well.  Post procedure: Pt was alert,oriened and ambulatory at discharge. D/C instructions given with verbal understanding.

## 2016-11-18 ENCOUNTER — Ambulatory Visit
Admission: RE | Admit: 2016-11-18 | Discharge: 2016-11-18 | Disposition: A | Discharge status: Home / Self Care | Payer: Medicaid Other | Source: Ambulatory Visit | Attending: Radiation Oncology | Admitting: Radiation Oncology

## 2016-11-18 ENCOUNTER — Ambulatory Visit (HOSPITAL_COMMUNITY)
Admission: RE | Admit: 2016-11-18 | Discharge: 2016-11-18 | Disposition: A | Discharge status: Home / Self Care | Payer: Medicaid Other | Source: Ambulatory Visit | Attending: Hematology and Oncology | Admitting: Hematology and Oncology

## 2016-11-18 DIAGNOSIS — Z510 Encounter for antineoplastic radiation therapy: Secondary | ICD-10-CM | POA: Diagnosis not present

## 2016-11-18 DIAGNOSIS — C539 Malignant neoplasm of cervix uteri, unspecified: Secondary | ICD-10-CM | POA: Diagnosis not present

## 2016-11-18 MED ORDER — PROMETHAZINE HCL 25 MG/ML IJ SOLN: 25.0000 mg | Freq: Once | INTRAMUSCULAR | Status: DC | PRN

## 2016-11-18 MED ORDER — SODIUM CHLORIDE 0.9% FLUSH
10.0000 mL | INTRAVENOUS | Status: AC | PRN
  Administered 2016-11-18: 10 mL

## 2016-11-18 MED ORDER — HEPARIN SOD (PORK) LOCK FLUSH 100 UNIT/ML IV SOLN
500.0000 [IU] | INTRAVENOUS | Status: AC | PRN
  Administered 2016-11-18: 500 [IU]
  Filled 2016-11-18: qty 5

## 2016-11-18 MED ORDER — SODIUM CHLORIDE 0.9 % IV SOLN
Freq: Once | INTRAVENOUS | Status: AC
  Administered 2016-11-18: 10:00:00 via INTRAVENOUS

## 2016-11-18 NOTE — Progress Notes (Signed)
Diagnosis: Cervical cancer [facility]) [C53.9]  Provider: Dr. Alvy Bimler  Procedure: Pt received 1 liter of normal saline over 2hours.  Pt tolerated well.  Post procedure: Pt was alert,oriened and ambulatory at discharge. D/C instructions given with verbal understanding.

## 2016-11-19 ENCOUNTER — Ambulatory Visit (HOSPITAL_COMMUNITY)
Admission: RE | Admit: 2016-11-19 | Discharge: 2016-11-19 | Disposition: A | Discharge status: Home / Self Care | Payer: Medicaid Other | Source: Ambulatory Visit | Attending: Hematology and Oncology | Admitting: Hematology and Oncology

## 2016-11-19 ENCOUNTER — Ambulatory Visit
Admission: RE | Admit: 2016-11-19 | Discharge: 2016-11-19 | Disposition: A | Discharge status: Home / Self Care | Payer: Medicaid Other | Source: Ambulatory Visit | Attending: Radiation Oncology | Admitting: Radiation Oncology

## 2016-11-19 DIAGNOSIS — Z510 Encounter for antineoplastic radiation therapy: Secondary | ICD-10-CM | POA: Diagnosis not present

## 2016-11-19 DIAGNOSIS — C539 Malignant neoplasm of cervix uteri, unspecified: Secondary | ICD-10-CM | POA: Diagnosis not present

## 2016-11-19 MED ORDER — PROMETHAZINE HCL 25 MG/ML IJ SOLN: 25.0000 mg | Freq: Once | INTRAMUSCULAR | Status: DC | PRN

## 2016-11-19 MED ORDER — SODIUM CHLORIDE 0.9 % IV SOLN
Freq: Once | INTRAVENOUS | Status: AC
  Administered 2016-11-19: 10:00:00 via INTRAVENOUS

## 2016-11-19 MED ORDER — SODIUM CHLORIDE 0.9% FLUSH
10.0000 mL | INTRAVENOUS | Status: AC | PRN
  Administered 2016-11-19: 10 mL

## 2016-11-19 MED ORDER — HEPARIN SOD (PORK) LOCK FLUSH 100 UNIT/ML IV SOLN
500.0000 [IU] | INTRAVENOUS | Status: AC | PRN
  Administered 2016-11-19: 500 [IU]
  Filled 2016-11-19: qty 5

## 2016-11-19 NOTE — Discharge Instructions (Signed)
Patient received 1 liter of NS via port a cath.

## 2016-11-19 NOTE — Progress Notes (Signed)
Diagnosis: Cervical cancer [facility]) [C53.9]  Provider: Dr. Alvy Bimler  Procedure: Pt received 1 liter of normal saline over 2hours and also received 25mg  of Phenergan IV.  Pt tolerated well.  Post procedure: Pt was alert,oriened and ambulatory at discharge. D/C instructions given with verbal understanding.

## 2016-11-23 ENCOUNTER — Other Ambulatory Visit: Payer: Self-pay | Admitting: Hematology and Oncology

## 2016-11-23 ENCOUNTER — Encounter: Payer: Self-pay | Admitting: Radiation Oncology

## 2016-11-23 ENCOUNTER — Ambulatory Visit
Admission: RE | Admit: 2016-11-23 | Discharge: 2016-11-23 | Disposition: A | Discharge status: Home / Self Care | Payer: Medicaid Other | Source: Ambulatory Visit | Attending: Radiation Oncology | Admitting: Radiation Oncology

## 2016-11-23 ENCOUNTER — Other Ambulatory Visit: Payer: Self-pay | Admitting: Radiation Oncology

## 2016-11-23 ENCOUNTER — Other Ambulatory Visit: Payer: Medicaid Other

## 2016-11-23 ENCOUNTER — Other Ambulatory Visit (HOSPITAL_BASED_OUTPATIENT_CLINIC_OR_DEPARTMENT_OTHER): Payer: Medicaid Other

## 2016-11-23 VITALS — BP 113/75 | HR 79 | Temp 98.1°F | Ht 62.0 in | Wt 146.4 lb

## 2016-11-23 DIAGNOSIS — Z510 Encounter for antineoplastic radiation therapy: Secondary | ICD-10-CM | POA: Diagnosis not present

## 2016-11-23 DIAGNOSIS — C539 Malignant neoplasm of cervix uteri, unspecified: Secondary | ICD-10-CM | POA: Diagnosis not present

## 2016-11-23 DIAGNOSIS — C530 Malignant neoplasm of endocervix: Secondary | ICD-10-CM

## 2016-11-23 LAB — COMPREHENSIVE METABOLIC PANEL
ALT: 27 U/L (ref 0–55)
ANION GAP: 8 meq/L (ref 3–11)
AST: 11 U/L (ref 5–34)
Albumin: 3.6 g/dL (ref 3.5–5.0)
Alkaline Phosphatase: 49 U/L (ref 40–150)
BUN: 11 mg/dL (ref 7.0–26.0)
CHLORIDE: 104 meq/L (ref 98–109)
CO2: 26 mEq/L (ref 22–29)
Calcium: 9.2 mg/dL (ref 8.4–10.4)
Creatinine: 0.8 mg/dL (ref 0.6–1.1)
Glucose: 95 mg/dl (ref 70–140)
POTASSIUM: 3.8 meq/L (ref 3.5–5.1)
SODIUM: 139 meq/L (ref 136–145)
Total Bilirubin: 0.27 mg/dL (ref 0.20–1.20)
Total Protein: 6.6 g/dL (ref 6.4–8.3)

## 2016-11-23 LAB — CBC WITH DIFFERENTIAL/PLATELET
BASO%: 0.5 % (ref 0.0–2.0)
Basophils Absolute: 0 10*3/uL (ref 0.0–0.1)
EOS ABS: 0.1 10*3/uL (ref 0.0–0.5)
EOS%: 3.4 % (ref 0.0–7.0)
HEMATOCRIT: 37.8 % (ref 34.8–46.6)
HEMOGLOBIN: 12.6 g/dL (ref 11.6–15.9)
LYMPH#: 0.8 10*3/uL — AB (ref 0.9–3.3)
LYMPH%: 20.1 % (ref 14.0–49.7)
MCH: 28.6 pg (ref 25.1–34.0)
MCHC: 33.3 g/dL (ref 31.5–36.0)
MCV: 85.8 fL (ref 79.5–101.0)
MONO#: 0.4 10*3/uL (ref 0.1–0.9)
MONO%: 9.8 % (ref 0.0–14.0)
NEUT%: 66.2 % (ref 38.4–76.8)
NEUTROS ABS: 2.7 10*3/uL (ref 1.5–6.5)
Platelets: 201 10*3/uL (ref 145–400)
RBC: 4.41 10*6/uL (ref 3.70–5.45)
RDW: 13.3 % (ref 11.2–14.5)
WBC: 4.1 10*3/uL (ref 3.9–10.3)

## 2016-11-23 LAB — MAGNESIUM: Magnesium: 2.3 mg/dl (ref 1.5–2.5)

## 2016-11-23 NOTE — Progress Notes (Signed)
  Radiation Oncology         [phone]) [phone] ________________________________  Name: Joanne Griffin MRN: [id number]  Date: 11/23/2016  DOB: 03/12/1986  Weekly Radiation Therapy Management    ICD-9-CM ICD-10-CM   1. 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 . . . . . . . The patient presents for routine under treatment assessment.                                   [name] has completed 10 fractions to her cervix and left subclavian area.  She continues to have pain in her lower abdomen.  She is taking Morphine 3-4 times a day.  She reports vomiting and having diarrhea yesterday.  She is taking zofran, ativan and phenergan for nausea.  She will have chemo tomorrow.  She reports having cramping after urination but says it is better than it was.  She is having vaginal spotting that started a few days ago.  She reports having fatigue.  The skin on her left supraclav.  area has slight hyperpigmentation.                                 Set-up films were reviewed.                                 The chart was checked. Physical Findings. . .  height is 5\' 2"  (1.575 m) and weight is 146 lb 6.4 oz (66.4 kg). Her oral temperature is 98.1 F (36.7 C). Her blood pressure is 113/75 and her pulse is 79. Her oxygen saturation is 100%. . Weight essentially stable.  I am unable to palpate the left supraclavicular lymph node today suggesting shrinkage from her treatment.   the lungs are clear. The heart has regular rhythm and rate. The abdomen is soft and nontender with bowel sounds slightly decreased Impression . . . . . . . The patient is tolerating radiation. Nausea continues to be a problem for her despite several medications concerning this issue. Plan . . . . . . . . . . . . Continue treatment as planned.  ________________________________   Blair Promise, PhD, MD

## 2016-11-23 NOTE — Progress Notes (Signed)
Joanne Griffin has completed 10 fractions to her cervix and left subclavian area.  She continues to have pain in her lower abdomen.  She is taking Morphine 3-4 times a day.  She reports vomiting and having diarrhea yesterday.  She is taking zofran, ativan and phenergan for nausea.  She will have chemo tomorrow.  She reports having cramping after urination but says it is better than it was.  She is having vaginal spotting that started a few days ago.  She reports having fatigue.  The skin on her left subclavian area has slight hyperpigmentation.  BP 113/75 (BP Location: Right Arm, Patient Position: Sitting)   Pulse 79   Temp 98.1 F (36.7 C) (Oral)   Ht 5\' 2"  (1.575 m)   Wt 146 lb 6.4 oz (66.4 kg)   SpO2 100%   BMI 26.78 kg/m    Wt Readings from Last 3 Encounters:  11/23/16 146 lb 6.4 oz (66.4 kg)  11/16/16 147 lb (66.7 kg)  10/29/16 142 lb 6 oz (64.6 kg)

## 2016-11-24 ENCOUNTER — Ambulatory Visit
Admission: RE | Admit: 2016-11-24 | Discharge: 2016-11-24 | Disposition: A | Discharge status: Home / Self Care | Payer: Medicaid Other | Source: Ambulatory Visit | Attending: Radiation Oncology | Admitting: Radiation Oncology

## 2016-11-24 ENCOUNTER — Ambulatory Visit (HOSPITAL_BASED_OUTPATIENT_CLINIC_OR_DEPARTMENT_OTHER): Payer: Medicaid Other

## 2016-11-24 ENCOUNTER — Encounter: Payer: Self-pay | Admitting: Hematology and Oncology

## 2016-11-24 ENCOUNTER — Ambulatory Visit (HOSPITAL_BASED_OUTPATIENT_CLINIC_OR_DEPARTMENT_OTHER): Payer: Medicaid Other | Admitting: Hematology and Oncology

## 2016-11-24 ENCOUNTER — Encounter: Payer: Self-pay | Admitting: General Practice

## 2016-11-24 ENCOUNTER — Other Ambulatory Visit: Payer: Self-pay | Admitting: *Deleted

## 2016-11-24 VITALS — BP 112/82 | HR 85 | Temp 98.0°F | Resp 18

## 2016-11-24 DIAGNOSIS — R110 Nausea: Secondary | ICD-10-CM

## 2016-11-24 DIAGNOSIS — K5909 Other constipation: Secondary | ICD-10-CM

## 2016-11-24 DIAGNOSIS — Z720 Tobacco use: Secondary | ICD-10-CM

## 2016-11-24 DIAGNOSIS — T451X5A Adverse effect of antineoplastic and immunosuppressive drugs, initial encounter: Secondary | ICD-10-CM

## 2016-11-24 DIAGNOSIS — H9312 Tinnitus, left ear: Secondary | ICD-10-CM | POA: Insufficient documentation

## 2016-11-24 DIAGNOSIS — F418 Other specified anxiety disorders: Secondary | ICD-10-CM

## 2016-11-24 DIAGNOSIS — C539 Malignant neoplasm of cervix uteri, unspecified: Secondary | ICD-10-CM | POA: Diagnosis not present

## 2016-11-24 DIAGNOSIS — Z510 Encounter for antineoplastic radiation therapy: Secondary | ICD-10-CM | POA: Diagnosis not present

## 2016-11-24 DIAGNOSIS — G893 Neoplasm related pain (acute) (chronic): Secondary | ICD-10-CM

## 2016-11-24 DIAGNOSIS — C530 Malignant neoplasm of endocervix: Secondary | ICD-10-CM

## 2016-11-24 DIAGNOSIS — Z5111 Encounter for antineoplastic chemotherapy: Secondary | ICD-10-CM

## 2016-11-24 MED ORDER — PALONOSETRON HCL INJECTION 0.25 MG/5ML
INTRAVENOUS | Status: AC
Start: 2016-11-24 — End: 2016-11-24
  Filled 2016-11-24: qty 5

## 2016-11-24 MED ORDER — PALONOSETRON HCL INJECTION 0.25 MG/5ML
0.2500 mg | Freq: Once | INTRAVENOUS | Status: AC
  Administered 2016-11-24: 0.25 mg via INTRAVENOUS

## 2016-11-24 MED ORDER — SODIUM CHLORIDE 0.9 % IV SOLN
Freq: Once | INTRAVENOUS | Status: AC
  Administered 2016-11-24: 09:00:00 via INTRAVENOUS

## 2016-11-24 MED ORDER — SODIUM CHLORIDE 0.9% FLUSH
10.0000 mL | INTRAVENOUS | Status: DC | PRN
  Administered 2016-11-24: 10 mL
  Filled 2016-11-24: qty 10

## 2016-11-24 MED ORDER — DEXTROSE-NACL 5-0.45 % IV SOLN
Freq: Once | INTRAVENOUS | Status: AC
  Administered 2016-11-24: 09:00:00 via INTRAVENOUS
  Filled 2016-11-24: qty 10

## 2016-11-24 MED ORDER — FOSAPREPITANT DIMEGLUMINE INJECTION 150 MG
Freq: Once | INTRAVENOUS | Status: AC
  Administered 2016-11-24: 11:00:00 via INTRAVENOUS
  Filled 2016-11-24: qty 5

## 2016-11-24 MED ORDER — HEPARIN SOD (PORK) LOCK FLUSH 100 UNIT/ML IV SOLN
500.0000 [IU] | Freq: Once | INTRAVENOUS | Status: AC | PRN
  Administered 2016-11-24: 500 [IU]
  Filled 2016-11-24: qty 5

## 2016-11-24 MED ORDER — SODIUM CHLORIDE 0.9 % IV SOLN
40.0000 mg/m2 | Freq: Once | INTRAVENOUS | Status: AC
  Administered 2016-11-24: 68 mg via INTRAVENOUS
  Filled 2016-11-24: qty 68

## 2016-11-24 MED ORDER — MORPHINE SULFATE 15 MG PO TABS: 15.0000 mg | ORAL_TABLET | Freq: Four times a day (QID) | ORAL | 0 refills | Status: DC | PRN

## 2016-11-24 MED ORDER — LORAZEPAM 0.5 MG PO TABS: 0.5000 mg | ORAL_TABLET | Freq: Three times a day (TID) | ORAL | 0 refills | Status: DC

## 2016-11-24 NOTE — Assessment & Plan Note (Addendum)
She has signs and symptoms of anxiety and depression She is not suicidal She felt that Wellbutrin is giving her a sense of well-being.  She will continue the same. She will also take lorazepam as needed

## 2016-11-24 NOTE — Assessment & Plan Note (Signed)
She developed significant nausea and vomiting with recent chemotherapy Her blood work is satisfactory We will proceed with treatment without dose adjustment

## 2016-11-24 NOTE — Assessment & Plan Note (Signed)
Her pelvic pain is well controlled with current prescription morphine sulfate. I refill her prescription today

## 2016-11-24 NOTE — Assessment & Plan Note (Signed)
she appears motivated to quit. Wellbutrin is helping and she is smoking less.  I congratulated her efforts

## 2016-11-24 NOTE — Assessment & Plan Note (Signed)
She has mild tinnitus from chemo This is mild I recommend continue treatment without dose adjustment

## 2016-11-24 NOTE — Assessment & Plan Note (Signed)
She has multifactorial nausea likely due to chemotherapy and recent severe constipation In addition to her current anti-emetics, I would recommend dexamethasone 8 mg daily for next 2 days after each treatment I would also recommend she take lorazepam before radiation treatment We have extensive discussion about laxative therapy

## 2016-11-24 NOTE — Patient Instructions (Signed)
[facility] Discharge Instructions for Patients Receiving Chemotherapy  Today you received the following chemotherapy agents: Cisplatin   To help prevent nausea and vomiting after your treatment, we encourage you to take your nausea medication as directed.    If you develop nausea and vomiting that is not controlled by your nausea medication, call the clinic.   BELOW ARE SYMPTOMS THAT SHOULD BE REPORTED IMMEDIATELY:  *FEVER GREATER THAN 100.5 F  *CHILLS WITH OR WITHOUT FEVER  NAUSEA AND VOMITING THAT IS NOT CONTROLLED WITH YOUR NAUSEA MEDICATION  *UNUSUAL SHORTNESS OF BREATH  *UNUSUAL BRUISING OR BLEEDING  TENDERNESS IN MOUTH AND THROAT WITH OR WITHOUT PRESENCE OF ULCERS  *URINARY PROBLEMS  *BOWEL PROBLEMS  UNUSUAL RASH Items with * indicate a potential emergency and should be followed up as soon as possible.  Feel free to call the clinic you have any questions or concerns. The clinic phone number is [phone].  Please show the CHEMO ALERT CARD at check-in to the Emergency Department and triage nurse.   

## 2016-11-24 NOTE — Assessment & Plan Note (Signed)
The patient had recent constipation, likely due to pain and her disease We discussed the importance of laxative therapy

## 2016-11-24 NOTE — Progress Notes (Signed)
[place] Spiritual Care Note  Followed up with [name] and her dad in infusion as planned.   She was relieved to be feeling so much better after so much pain/nausea/constipation last week.  Family support is very meaningful to her, and she became tearful when I reiterated care and encouragement, too.  Her dad travels a lot for work, so one of her key supporters will now be away for some time.  Lorely is working to keep focused on the present, rather than fixing her expectations on particular courses and outcomes, because she has experienced so many revisions in her care plan already.  Overall, recognizing the ways in which "it could be worse" is helping her to stay positive, optimistic, and grateful.  [name] knows to Ship broker anytime.  We plan for me to continue to f/u when she is on campus (next infusion 6/20), and I will mail out a handwritten note of encouragement this afternoon.   [place], [place], [facility] Pager [phone] Voicemail [phone]

## 2016-11-24 NOTE — Progress Notes (Signed)
[place] OFFICE PROGRESS NOTE  Patient Care Team: Patient, No Pcp Per as PCP - General (General Practice)  SUMMARY OF ONCOLOGIC HISTORY:   Adenocarcinoma (GYN origin)   09/01/2016 Initial Diagnosis    Adenocarcinoma (GYN origin)      Cervical cancer ([place])   08/26/2016 Pathology Results    Adequacy Reason Satisfactory for evaluation, endocervical/transformation zone component PRESENT. Diagnosis ATYPICAL GLANDULAR CELLS. SEE COMMENT. COMMENT: THERE ARE GROUPS OF ATYPICAL GLANDULAR CELLS WHICH APPEAR TO BE OF ENDOCERVICAL ORIGIN. TISSUE STUDIES, SUCH AS A CURETTAGE, MAY HELP BETTER EVALUATE THE EXTENT AND SEVERITY OF THESE ATYPICAL CELLS.      09/01/2016 Initial Diagnosis    The patient reports she is not had Pap smears in the "many years". She presented initially with flank pain to the emergency room on 07/29/2016. She was treated for a UTI and had resolution of her flank pain although she continued to have suprapubic pain. She has had irregular bleeding and a copious discharge for several months. 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 Microscopic Comment Immunohistochemistry is attempted to determine endocervical vs. endometrial origin. CEA is focally positive, p16 is diffusely strongly positive, ER is weakly positive, and vimentin is positive. This profile is not specific as to origin and clinical correlation is recommended. Dr. Avis Epley has reviewed the case. The case was called to Dr. Ilda Basset on 09/06/2016      10/05/2016 Imaging    Ct abdomen: 4.7 cm heterogeneous enhancing mass in the cervix and lower uterine segment, consistent with known cervical carcinoma. Mild retroperitoneal lymphadenopathy in the inferior aortocaval space and right common iliac chain, suspicious for  metastatic disease.      10/29/2016 Procedure    Ultrasound and fluoroscopically guided right internal jugular single lumen power port catheter insertion. Tip in the SVC/RA junction. Catheter ready for use      11/09/2016 -  Radiation Therapy    She receives concurrent radiation treatment      11/09/2016 -  Chemotherapy    She receives weekly cisplatin       INTERVAL HISTORY: Please see below for problem oriented charting. She is seen in the infusion room prior to cycle 3 of chemotherapy After additional IV fluid support and anti-emetics, her nausea has improved She denies further constipation She is taking laxative regularly Her anxiety and depression symptoms are stable She continues to smoke and is attempting to quit smoking She had recent tinnitus on the left side. She denies peripheral neuropathy She has occasional vaginal spotting but no heavy bleeding She denies mucositis.  Appetite is stable Her pelvic pain is well controlled with current prescription pain medicine  REVIEW OF SYSTEMS:   Constitutional: Denies fevers, chills or abnormal weight loss Eyes: Denies blurriness of vision Ears, nose, mouth, throat, and face: Denies mucositis or sore throat Respiratory: Denies cough, dyspnea or wheezes Cardiovascular: Denies palpitation, chest discomfort or lower extremity swelling Skin: Denies abnormal skin rashes Lymphatics: Denies new lymphadenopathy or easy bruising Neurological:Denies numbness, tingling or new weaknesses Behavioral/Psych: Mood is stable, no new changes  All other systems were reviewed with the patient and are negative.  I have reviewed the past medical history, past surgical history, social history and family history with the patient and they are unchanged from previous note.  ALLERGIES:  is allergic  to amoxicillin and penicillins.  MEDICATIONS:  Current Outpatient Prescriptions  Medication Sig Dispense Refill  . buPROPion (WELLBUTRIN) 100 MG tablet  Take 1 tablet (100 mg total) by mouth 2 (two) times daily. 60 tablet 1  . dexamethasone (DECADRON) 4 MG tablet Take 2 tablets daily with breakfast for 2 days after each chemo treatment, every week 30 tablet 1  . hyaluronate sodium (RADIAPLEXRX) GEL Apply 1 application topically 2 (two) times daily.    Marland Kitchen lidocaine-prilocaine (EMLA) cream Apply to affected area once 30 g 3  . LORazepam (ATIVAN) 0.5 MG tablet Take 1 tablet (0.5 mg total) by mouth every 8 (eight) hours. As Needed for anxiety or nausea 60 tablet 0  . morphine (MSIR) 15 MG tablet Take 1 tablet (15 mg total) by mouth every 6 (six) hours as needed for severe pain. 60 tablet 0  . ondansetron (ZOFRAN) 8 MG tablet Take 1 tablet (8 mg total) by mouth 2 (two) times daily as needed. Start on the third day after chemotherapy. 30 tablet 1  . prochlorperazine (COMPAZINE) 10 MG tablet TAKE 1 TABLET (10 MG TOTAL) BY MOUTH EVERY 6 (SIX) HOURS AS NEEDED (NAUSEA OR VOMITING). 30 tablet 1   No current facility-administered medications for this visit.    Facility-Administered Medications Ordered in Other Visits  Medication Dose Route Frequency Provider Last Rate Last Dose  . CISplatin (PLATINOL) 68 mg in sodium chloride 0.9 % 250 mL chemo infusion  40 mg/m2 (Treatment Plan Recorded) Intravenous Once Alvy Bimler, Caila Cirelli, MD      . fosaprepitant (EMEND) 150 mg, dexamethasone (DECADRON) 12 mg in sodium chloride 0.9 % 145 mL IVPB   Intravenous Once Alvy Bimler, Tenia Goh, MD      . heparin lock flush 100 unit/mL  500 Units Intracatheter Once PRN Alvy Bimler, Zohal Reny, MD      . palonosetron (ALOXI) injection 0.25 mg  0.25 mg Intravenous Once Leslie Langille, MD      . sodium chloride flush (NS) 0.9 % injection 10 mL  10 mL Intracatheter PRN Alvy Bimler, Jackelynn Hosie, MD        PHYSICAL EXAMINATION: ECOG PERFORMANCE STATUS: 1 - Symptomatic but completely ambulatory GENERAL:alert, no distress and comfortable SKIN: skin color, texture, turgor are normal, no rashes or significant lesions EYES: normal,  Conjunctiva are pink and non-injected, sclera clear OROPHARYNX:no exudate, no erythema and lips, buccal mucosa, and tongue normal  NECK: supple, thyroid normal size, non-tender, without nodularity LYMPH:  no palpable lymphadenopathy in the cervical, axillary or inguinal LUNGS: clear to auscultation and percussion with normal breathing effort HEART: regular rate & rhythm and no murmurs and no lower extremity edema ABDOMEN:abdomen soft, non-tender and normal bowel sounds Musculoskeletal:no cyanosis of digits and no clubbing  NEURO: alert & oriented x 3 with fluent speech, no focal motor/sensory deficits  LABORATORY DATA:  I have reviewed the data as listed    Component Value Date/Time   NA 139 11/23/2016 1536   K 3.8 11/23/2016 1536   CL 103 10/29/2016 1322   CO2 26 11/23/2016 1536   GLUCOSE 95 11/23/2016 1536   BUN 11.0 11/23/2016 1536   CREATININE 0.8 11/23/2016 1536   CALCIUM 9.2 11/23/2016 1536   PROT 6.6 11/23/2016 1536   ALBUMIN 3.6 11/23/2016 1536   AST 11 11/23/2016 1536   ALT 27 11/23/2016 1536   ALKPHOS 49 11/23/2016 1536   BILITOT 0.27 11/23/2016 1536   GFRNONAA >60 10/29/2016 1322   GFRAA >60 10/29/2016 1322    No results found for: SPEP, UPEP  Lab Results  Component Value Date   WBC 4.1 11/23/2016   NEUTROABS 2.7 11/23/2016   HGB 12.6 11/23/2016   HCT 37.8 11/23/2016   MCV 85.8 11/23/2016   PLT 201 11/23/2016      Chemistry      Component Value Date/Time   NA 139 11/23/2016 1536   K 3.8 11/23/2016 1536   CL 103 10/29/2016 1322   CO2 26 11/23/2016 1536   BUN 11.0 11/23/2016 1536   CREATININE 0.8 11/23/2016 1536      Component Value Date/Time   CALCIUM 9.2 11/23/2016 1536   ALKPHOS 49 11/23/2016 1536   AST 11 11/23/2016 1536   ALT 27 11/23/2016 1536   BILITOT 0.27 11/23/2016 1536       RADIOGRAPHIC STUDIES: I have personally reviewed the radiological images as listed and agreed with the findings in the report. Nm Pet Image Initial (pi) Skull  Base To Thigh  Result Date: 11/01/2016 CLINICAL DATA:  Initial treatment strategy for cervical cancer with enlarged lymph nodes. EXAM: NUCLEAR MEDICINE PET SKULL BASE TO THIGH TECHNIQUE: 7.2 mCi F-18 FDG was injected intravenously. Full-ring PET imaging was performed from the skull base to thigh after the radiotracer. CT data was obtained and used for attenuation correction and anatomic localization. FASTING BLOOD GLUCOSE:  Value: 109 mg/dl COMPARISON:  CT scan 10/05/2016 FINDINGS: NECK Relatively symmetric tonsillar activity most likely physiologic. Similar physiologic activity in the glottis. A lymph node at the border of level IV and the supraclavicular chain on the left measuring 9 mm in short axis on image 37/4 news mildly but abnormally hypermetabolic with maximum SUV 4.6. CHEST No hypermetabolic mediastinal or hilar nodes. No suspicious pulmonary nodules on the CT dated. Right subclavian catheter tip in the right atrium, small amount of gas adjacent to the catheter likely related to recent placement. Background mediastinal blood pool activity 2.4. ABDOMEN/PELVIS Hypermetabolic bilateral common iliac chain adenopathy with small hypermetabolic foci also along the margins of both ovaries. Intense hypermetabolic activity in the cervix, somewhat difficult to measure due to the proximity of the urinary bladder, but with maximum SUV of approximately 17.5 Inferior aortocaval lymph node measuring 8 mm in short axis on image 123/4 is not appreciably hypermetabolic. An index right common iliac node measuring 1.1 cm in short axis on image 138/4 has a maximum SUV of 8.1. The other lymph nodes in this vicinity have similar activity. Indistinct activity along the periphery of the left ovary appears to be separate from the ureter, and has a maximum standard uptake value of 6.5. Activity along the upper medial margin of the right ovary is thought to probably relate to adjacent loops of collapsed bowel. There is mild activity  along the perineum likely from mild incontinence. SKELETON No focal hypermetabolic activity to suggest skeletal metastasis. 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 IMPRESSION: 1. Intensely hypermetabolic cervical mass, with hypermetabolic adenopathy in the common iliac chains, as well as a faintly but abnormally hypermetabolic left supraclavicular lymph node. 2. There is some indistinct accentuated activity along the periphery of the left ovary with maximum SUV 6.5, if the ovaries not removed in the context of therapy for the cervical cancer, surveillance would likely be indicated. 3. 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 Electronically Signed   By: Van Clines M.D.   On: 11/01/2016 15:50   Ir US Guide Vasc Access Right  Result Date: 10/29/2016 CLINICAL DATA:  Cervical carcinoma, access for chemotherapy EXAM: RIGHT INTERNAL  JUGULAR SINGLE LUMEN POWER PORT CATHETER INSERTION Date:  5/4/20185/09/2016 5:09 pm Radiologist:  M. Daryll Brod, MD Guidance:  Ultrasound and fluoroscopic MEDICATIONS: 1 g vancomycin; The antibiotic was administered within an appropriate time interval prior to skin puncture. ANESTHESIA/SEDATION: Versed 3.0 mg IV; Fentanyl 100 mcg IV; Moderate Sedation Time:  30 minutes The patient was continuously monitored during the procedure by the interventional radiology nurse under my direct supervision. FLUOROSCOPY TIME:  36 seconds (3.0 mGy) COMPLICATIONS: None immediate. CONTRAST:  None. PROCEDURE: Informed consent was obtained from the patient following explanation of the procedure, risks, benefits and alternatives. The patient understands, agrees and consents for the procedure. All questions were addressed. A time out was performed. Maximal barrier sterile technique utilized including caps, mask, sterile gowns, sterile gloves, large sterile drape, hand hygiene, and 2% chlorhexidine scrub. Under sterile conditions and local anesthesia,  right internal jugular micropuncture venous access was performed. Access was performed with ultrasound. Images were obtained for documentation. A guide wire was inserted followed by a transitional dilator. This allowed insertion of a guide wire and catheter into the IVC. Measurements were obtained from the SVC / RA junction back to the right IJ venotomy site. In the right infraclavicular chest, a subcutaneous pocket was created over the second anterior rib. This was done under sterile conditions and local anesthesia. 1% lidocaine with epinephrine was utilized for this. A 2.5 cm incision was made in the skin. Blunt dissection was performed to create a subcutaneous pocket over the right pectoralis major muscle. The pocket was flushed with saline vigorously. There was adequate hemostasis. The port catheter was assembled and checked for leakage. The port catheter was secured in the pocket with two retention sutures. The tubing was tunneled subcutaneously to the right venotomy site and inserted into the SVC/RA junction through a valved peel-away sheath. Position was confirmed with fluoroscopy. Images were obtained for documentation. The patient tolerated the procedure well. No immediate complications. Incisions were closed in a two layer fashion with 4 - 0 Vicryl suture. Dermabond was applied to the skin. The port catheter was accessed, blood was aspirated followed by saline and heparin flushes. Needle was removed. A dry sterile dressing was applied. IMPRESSION: Ultrasound and fluoroscopically guided right internal jugular single lumen power port catheter insertion. Tip in the SVC/RA junction. Catheter ready for use. Electronically Signed   By: Jerilynn Mages.  Shick M.D.   On: 10/29/2016 17:13   Ir Fluoro Guide Port Insertion Right  Result Date: 10/29/2016 CLINICAL DATA:  Cervical carcinoma, access for chemotherapy EXAM: RIGHT INTERNAL JUGULAR SINGLE LUMEN POWER PORT CATHETER INSERTION Date:  5/4/20185/09/2016 5:09 pm Radiologist:   M. Daryll Brod, MD Guidance:  Ultrasound and fluoroscopic MEDICATIONS: 1 g vancomycin; The antibiotic was administered within an appropriate time interval prior to skin puncture. ANESTHESIA/SEDATION: Versed 3.0 mg IV; Fentanyl 100 mcg IV; Moderate Sedation Time:  30 minutes The patient was continuously monitored during the procedure by the interventional radiology nurse under my direct supervision. FLUOROSCOPY TIME:  36 seconds (3.0 mGy) COMPLICATIONS: None immediate. CONTRAST:  None. PROCEDURE: Informed consent was obtained from the patient following explanation of the procedure, risks, benefits and alternatives. The patient understands, agrees and consents for the procedure. All questions were addressed. A time out was performed. Maximal barrier sterile technique utilized including caps, mask, sterile gowns, sterile gloves, large sterile drape, hand hygiene, and 2% chlorhexidine scrub. Under sterile conditions and local anesthesia, right internal jugular micropuncture venous access was performed. Access was performed with ultrasound. Images were obtained for  documentation. A guide wire was inserted followed by a transitional dilator. This allowed insertion of a guide wire and catheter into the IVC. Measurements were obtained from the SVC / RA junction back to the right IJ venotomy site. In the right infraclavicular chest, a subcutaneous pocket was created over the second anterior rib. This was done under sterile conditions and local anesthesia. 1% lidocaine with epinephrine was utilized for this. A 2.5 cm incision was made in the skin. Blunt dissection was performed to create a subcutaneous pocket over the right pectoralis major muscle. The pocket was flushed with saline vigorously. There was adequate hemostasis. The port catheter was assembled and checked for leakage. The port catheter was secured in the pocket with two retention sutures. The tubing was tunneled subcutaneously to the right venotomy site and  inserted into the SVC/RA junction through a valved peel-away sheath. Position was confirmed with fluoroscopy. Images were obtained for documentation. The patient tolerated the procedure well. No immediate complications. Incisions were closed in a two layer fashion with 4 - 0 Vicryl suture. Dermabond was applied to the skin. The port catheter was accessed, blood was aspirated followed by saline and heparin flushes. Needle was removed. A dry sterile dressing was applied. IMPRESSION: Ultrasound and fluoroscopically guided right internal jugular single lumen power port catheter insertion. Tip in the SVC/RA junction. Catheter ready for use. Electronically Signed   By: Jerilynn Mages.  Shick M.D.   On: 10/29/2016 17:13    ASSESSMENT & PLAN:  Cervical cancer ([place]) She developed significant nausea and vomiting with recent chemotherapy Her blood work is satisfactory We will proceed with treatment without dose adjustment   Chemotherapy-induced nausea She has multifactorial nausea likely due to chemotherapy and recent severe constipation In addition to her current anti-emetics, I would recommend dexamethasone 8 mg daily for next 2 days after each treatment I would also recommend she take lorazepam before radiation treatment We have extensive discussion about laxative therapy  Cancer associated pain Her pelvic pain is well controlled with current prescription morphine sulfate. I refill her prescription today  Tobacco abuse she appears motivated to quit. Wellbutrin is helping and she is smoking less.  I congratulated her efforts  Other constipation The patient had recent constipation, likely due to pain and her disease We discussed the importance of laxative therapy  Depression with anxiety She has signs and symptoms of anxiety and depression She is not suicidal She felt that Wellbutrin is giving her a sense of well-being.  She will continue the same. She will also take lorazepam as needed  Tinnitus of left  ear She has mild tinnitus from chemo This is mild I recommend continue treatment without dose adjustment   No orders of the defined types were placed in this encounter.  All questions were answered. The patient knows to call the clinic with any problems, questions or concerns. No barriers to learning was detected. I spent 20 minutes counseling the patient face to face. The total time spent in the appointment was 30 minutes and more than 50% was on counseling and review of test results     Heath Lark, MD 11/24/2016 10:49 AM

## 2016-11-25 ENCOUNTER — Ambulatory Visit
Admission: RE | Admit: 2016-11-25 | Discharge: 2016-11-25 | Disposition: A | Discharge status: Home / Self Care | Payer: Medicaid Other | Source: Ambulatory Visit | Attending: Radiation Oncology | Admitting: Radiation Oncology

## 2016-11-25 DIAGNOSIS — Z510 Encounter for antineoplastic radiation therapy: Secondary | ICD-10-CM | POA: Diagnosis not present

## 2016-11-26 ENCOUNTER — Ambulatory Visit
Admission: RE | Admit: 2016-11-26 | Discharge: 2016-11-26 | Disposition: A | Discharge status: Home / Self Care | Payer: Medicaid Other | Source: Ambulatory Visit | Attending: Radiation Oncology | Admitting: Radiation Oncology

## 2016-11-26 DIAGNOSIS — Z510 Encounter for antineoplastic radiation therapy: Secondary | ICD-10-CM | POA: Diagnosis not present

## 2016-11-29 ENCOUNTER — Other Ambulatory Visit (HOSPITAL_BASED_OUTPATIENT_CLINIC_OR_DEPARTMENT_OTHER): Payer: Medicaid Other

## 2016-11-29 ENCOUNTER — Encounter: Payer: Self-pay | Admitting: Hematology and Oncology

## 2016-11-29 ENCOUNTER — Ambulatory Visit
Admission: RE | Admit: 2016-11-29 | Discharge: 2016-11-29 | Disposition: A | Discharge status: Home / Self Care | Payer: Medicaid Other | Source: Ambulatory Visit | Attending: Radiation Oncology | Admitting: Radiation Oncology

## 2016-11-29 ENCOUNTER — Other Ambulatory Visit: Payer: Self-pay | Admitting: *Deleted

## 2016-11-29 DIAGNOSIS — C530 Malignant neoplasm of endocervix: Secondary | ICD-10-CM

## 2016-11-29 DIAGNOSIS — C539 Malignant neoplasm of cervix uteri, unspecified: Secondary | ICD-10-CM | POA: Diagnosis not present

## 2016-11-29 DIAGNOSIS — Z510 Encounter for antineoplastic radiation therapy: Secondary | ICD-10-CM | POA: Diagnosis not present

## 2016-11-29 LAB — MAGNESIUM: MAGNESIUM: 2.2 mg/dL (ref 1.5–2.5)

## 2016-11-29 LAB — COMPREHENSIVE METABOLIC PANEL
ALBUMIN: 3.8 g/dL (ref 3.5–5.0)
ALK PHOS: 50 U/L (ref 40–150)
ALT: 17 U/L (ref 0–55)
AST: 10 U/L (ref 5–34)
Anion Gap: 9 mEq/L (ref 3–11)
BUN: 18.8 mg/dL (ref 7.0–26.0)
CALCIUM: 9.3 mg/dL (ref 8.4–10.4)
CO2: 27 mEq/L (ref 22–29)
CREATININE: 0.8 mg/dL (ref 0.6–1.1)
Chloride: 103 mEq/L (ref 98–109)
EGFR: >90 mL/min/{1.73_m2} (ref >90)
Glucose: 104 mg/dl (ref 70–140)
POTASSIUM: 4 meq/L (ref 3.5–5.1)
SODIUM: 138 meq/L (ref 136–145)
Total Bilirubin: 0.41 mg/dL (ref 0.20–1.20)
Total Protein: 6.8 g/dL (ref 6.4–8.3)

## 2016-11-29 LAB — CBC WITH DIFFERENTIAL/PLATELET
BASO%: 0.4 % (ref 0.0–2.0)
BASOS ABS: 0 10*3/uL (ref 0.0–0.1)
EOS%: 2.4 % (ref 0.0–7.0)
Eosinophils Absolute: 0.1 10*3/uL (ref 0.0–0.5)
HEMATOCRIT: 37.6 % (ref 34.8–46.6)
HGB: 12.6 g/dL (ref 11.6–15.9)
LYMPH#: 0.4 10*3/uL — AB (ref 0.9–3.3)
LYMPH%: 14.9 % (ref 14.0–49.7)
MCH: 28.8 pg (ref 25.1–34.0)
MCHC: 33.4 g/dL (ref 31.5–36.0)
MCV: 86.1 fL (ref 79.5–101.0)
MONO#: 0.3 10*3/uL (ref 0.1–0.9)
MONO%: 8.9 % (ref 0.0–14.0)
NEUT#: 2.1 10*3/uL (ref 1.5–6.5)
NEUT%: 73.4 % (ref 38.4–76.8)
PLATELETS: 193 10*3/uL (ref 145–400)
RBC: 4.36 10*6/uL (ref 3.70–5.45)
RDW: 13.4 % (ref 11.2–14.5)
WBC: 2.8 10*3/uL — ABNORMAL LOW (ref 3.9–10.3)

## 2016-11-29 MED ORDER — PROCHLORPERAZINE MALEATE 10 MG PO TABS: 10.0000 mg | ORAL_TABLET | Freq: Four times a day (QID) | ORAL | 1 refills | Status: DC | PRN

## 2016-11-29 MED ORDER — ONDANSETRON HCL 8 MG PO TABS: 8.0000 mg | ORAL_TABLET | Freq: Two times a day (BID) | ORAL | 1 refills | Status: DC | PRN

## 2016-11-30 ENCOUNTER — Ambulatory Visit
Admission: RE | Admit: 2016-11-30 | Discharge: 2016-11-30 | Disposition: A | Discharge status: Home / Self Care | Payer: Medicaid Other | Source: Ambulatory Visit | Attending: Radiation Oncology | Admitting: Radiation Oncology

## 2016-11-30 ENCOUNTER — Encounter: Payer: Self-pay | Admitting: Radiation Oncology

## 2016-11-30 VITALS — BP 111/75 | HR 68 | Temp 98.1°F | Ht 62.0 in | Wt 143.2 lb

## 2016-11-30 DIAGNOSIS — C530 Malignant neoplasm of endocervix: Secondary | ICD-10-CM

## 2016-11-30 DIAGNOSIS — Z510 Encounter for antineoplastic radiation therapy: Secondary | ICD-10-CM | POA: Diagnosis not present

## 2016-11-30 NOTE — Progress Notes (Signed)
Joanne Griffin has completed 15 fractions to her cervix and left subclavian area.  She reports having pain in her pelvis and vaginal area today.  She continues to take Morphine 15 mg 2-3 times per day.  She reports having dysuria that started today.  She also has vaginal itching that started yesterday.  She has been given a sitz bath to use. She continues to having nausea and is rotating nausea medications.  She is wondering if there is anything she can take for motion sickness as she will be traveling to [place] for her brother's graduation.  She denies having diarrhea and had a bowel movement today.  She reports having fatigue.  The skin on her left subclavian area and shoulder is red.  She is using radiaplex as directed.  BP 111/75 (BP Location: Right Arm, Patient Position: Sitting)   Pulse 68   Temp 98.1 F (36.7 C) (Oral)   Ht 5\' 2"  (1.575 m)   Wt 143 lb 3.2 oz (65 kg)   SpO2 100%   BMI 26.19 kg/m    Wt Readings from Last 3 Encounters:  11/30/16 143 lb 3.2 oz (65 kg)  11/23/16 146 lb 6.4 oz (66.4 kg)  11/16/16 147 lb (66.7 kg)

## 2016-11-30 NOTE — Progress Notes (Signed)
  Radiation Oncology         [phone]) [phone] ________________________________  Name: Joanne Griffin MRN: [id number]  Date: 11/30/2016  DOB: 1985/12/02  Weekly Radiation Therapy Management    ICD-9-CM ICD-10-CM   1. Malignant neoplasm of endocervix (HCC) 180.0 C53.0      Current Dose: 27 Gy     Planned Dose:  54+ Gy  Narrative . . . . . . . . The patient presents for routine under treatment assessment.                                   Joanne Griffin has completed 15 fractions to her cervix and left supraclavian area.  She reports having pain in her pelvis and vaginal area today.  She continues to take Morphine 15 mg 2-3 times per day.  She reports having dysuria that started today.  She also has vaginal itching that started yesterday.  She has been given a sitz bath to use. She continues to having nausea and is rotating nausea medications.  She is wondering if there is anything she can take for motion sickness as she will be traveling to [place] for her brother's graduation.  She denies having diarrhea and had a bowel movement today.  She reports having fatigue.  The skin on her left supraclavian area and shoulder is red.  She is using radiaplex as directed.                                 Set-up films were reviewed.                                 The chart was checked. Physical Findings. . .  height is 5\' 2"  (1.575 m) and weight is 143 lb 3.2 oz (65 kg). Her oral temperature is 98.1 F (36.7 C). Her blood pressure is 111/75 and her pulse is 68. Her oxygen saturation is 100%. . Weight essentially stable.  No palpable left supraclavicular adenopathy. Erythema noted in this area. The lungs are clear. The heart has a regular rhythm and rate. The abdomen is soft and nontender with normal bowel sounds. Impression . . . . . . . The patient is tolerating radiation. Plan . . . . . . . . . . . . Continue treatment as planned.  ________________________________   Blair Promise, PhD, MD

## 2016-12-01 ENCOUNTER — Ambulatory Visit (HOSPITAL_BASED_OUTPATIENT_CLINIC_OR_DEPARTMENT_OTHER): Payer: Medicaid Other

## 2016-12-01 ENCOUNTER — Encounter: Payer: Self-pay | Admitting: Hematology and Oncology

## 2016-12-01 ENCOUNTER — Ambulatory Visit (HOSPITAL_BASED_OUTPATIENT_CLINIC_OR_DEPARTMENT_OTHER): Payer: Medicaid Other | Admitting: Hematology and Oncology

## 2016-12-01 ENCOUNTER — Ambulatory Visit
Admission: RE | Admit: 2016-12-01 | Discharge: 2016-12-01 | Disposition: A | Discharge status: Home / Self Care | Payer: Medicaid Other | Source: Ambulatory Visit | Attending: Radiation Oncology | Admitting: Radiation Oncology

## 2016-12-01 VITALS — BP 110/76 | HR 83 | Temp 97.8°F | Resp 18

## 2016-12-01 DIAGNOSIS — Z5111 Encounter for antineoplastic chemotherapy: Secondary | ICD-10-CM

## 2016-12-01 DIAGNOSIS — C530 Malignant neoplasm of endocervix: Secondary | ICD-10-CM

## 2016-12-01 DIAGNOSIS — D701 Agranulocytosis secondary to cancer chemotherapy: Secondary | ICD-10-CM | POA: Diagnosis not present

## 2016-12-01 DIAGNOSIS — T451X5A Adverse effect of antineoplastic and immunosuppressive drugs, initial encounter: Secondary | ICD-10-CM

## 2016-12-01 DIAGNOSIS — Z510 Encounter for antineoplastic radiation therapy: Secondary | ICD-10-CM | POA: Diagnosis not present

## 2016-12-01 DIAGNOSIS — R110 Nausea: Secondary | ICD-10-CM

## 2016-12-01 DIAGNOSIS — C539 Malignant neoplasm of cervix uteri, unspecified: Secondary | ICD-10-CM | POA: Diagnosis not present

## 2016-12-01 DIAGNOSIS — Z720 Tobacco use: Secondary | ICD-10-CM | POA: Diagnosis not present

## 2016-12-01 DIAGNOSIS — C801 Malignant (primary) neoplasm, unspecified: Secondary | ICD-10-CM

## 2016-12-01 MED ORDER — SODIUM CHLORIDE 0.9 % IV SOLN
Freq: Once | INTRAVENOUS | Status: AC
  Administered 2016-12-01: 11:00:00 via INTRAVENOUS
  Filled 2016-12-01: qty 5

## 2016-12-01 MED ORDER — PROCHLORPERAZINE MALEATE 10 MG PO TABS: 10.0000 mg | ORAL_TABLET | Freq: Four times a day (QID) | ORAL | 1 refills | Status: DC | PRN

## 2016-12-01 MED ORDER — PALONOSETRON HCL INJECTION 0.25 MG/5ML
0.2500 mg | Freq: Once | INTRAVENOUS | Status: AC
  Administered 2016-12-01: 0.25 mg via INTRAVENOUS

## 2016-12-01 MED ORDER — SODIUM CHLORIDE 0.9 % IV SOLN
40.0000 mg/m2 | Freq: Once | INTRAVENOUS | Status: AC
  Administered 2016-12-01: 68 mg via INTRAVENOUS
  Filled 2016-12-01: qty 68

## 2016-12-01 MED ORDER — POTASSIUM CHLORIDE 2 MEQ/ML IV SOLN
Freq: Once | INTRAVENOUS | Status: DC
  Filled 2016-12-01: qty 10

## 2016-12-01 MED ORDER — SODIUM CHLORIDE 0.9% FLUSH
10.0000 mL | INTRAVENOUS | Status: DC | PRN
  Administered 2016-12-01: 10 mL
  Filled 2016-12-01: qty 10

## 2016-12-01 MED ORDER — PALONOSETRON HCL INJECTION 0.25 MG/5ML
INTRAVENOUS | Status: AC
  Filled 2016-12-01: qty 5

## 2016-12-01 MED ORDER — SODIUM CHLORIDE 0.9 % IV SOLN
Freq: Once | INTRAVENOUS | Status: AC
  Administered 2016-12-01: 11:00:00 via INTRAVENOUS

## 2016-12-01 MED ORDER — HEPARIN SOD (PORK) LOCK FLUSH 100 UNIT/ML IV SOLN
500.0000 [IU] | Freq: Once | INTRAVENOUS | Status: AC | PRN
  Administered 2016-12-01: 500 [IU]
  Filled 2016-12-01: qty 5

## 2016-12-01 MED ORDER — ACETAMINOPHEN 500 MG PO TABS
1000.0000 mg | ORAL_TABLET | Freq: Once | ORAL | Status: AC
  Administered 2016-12-01: 1000 mg via ORAL

## 2016-12-01 MED ORDER — ONDANSETRON HCL 8 MG PO TABS: 8.0000 mg | ORAL_TABLET | Freq: Two times a day (BID) | ORAL | 1 refills | Status: DC | PRN

## 2016-12-01 MED ORDER — ACETAMINOPHEN 500 MG PO TABS
ORAL_TABLET | ORAL | Status: AC
  Filled 2016-12-01: qty 2

## 2016-12-01 MED ORDER — POTASSIUM CHLORIDE 2 MEQ/ML IV SOLN
Freq: Once | INTRAVENOUS | Status: AC
  Administered 2016-12-01: 09:00:00 via INTRAVENOUS
  Filled 2016-12-01: qty 1000

## 2016-12-01 NOTE — Assessment & Plan Note (Signed)
She has multifactorial nausea likely due to chemotherapy and recent severe constipation In addition to her current anti-emetics, I would recommend dexamethasone 8 mg daily for next 2 days after each treatment I would also recommend she take lorazepam before radiation treatment We have extensive discussion about laxative therapy

## 2016-12-01 NOTE — Assessment & Plan Note (Signed)
she appears motivated to quit. Wellbutrin is helping and she is smoking less.  I congratulated her efforts

## 2016-12-01 NOTE — Patient Instructions (Signed)
[facility] Discharge Instructions for Patients Receiving Chemotherapy  Today you received the following chemotherapy agents: Cisplatin   To help prevent nausea and vomiting after your treatment, we encourage you to take your nausea medication as directed.    If you develop nausea and vomiting that is not controlled by your nausea medication, call the clinic.   BELOW ARE SYMPTOMS THAT SHOULD BE REPORTED IMMEDIATELY:  *FEVER GREATER THAN 100.5 F  *CHILLS WITH OR WITHOUT FEVER  NAUSEA AND VOMITING THAT IS NOT CONTROLLED WITH YOUR NAUSEA MEDICATION  *UNUSUAL SHORTNESS OF BREATH  *UNUSUAL BRUISING OR BLEEDING  TENDERNESS IN MOUTH AND THROAT WITH OR WITHOUT PRESENCE OF ULCERS  *URINARY PROBLEMS  *BOWEL PROBLEMS  UNUSUAL RASH Items with * indicate a potential emergency and should be followed up as soon as possible.  Feel free to call the clinic you have any questions or concerns. The clinic phone number is [phone].  Please show the CHEMO ALERT CARD at check-in to the Emergency Department and triage nurse.   

## 2016-12-01 NOTE — Assessment & Plan Note (Signed)
This is likely due to recent treatment. The patient denies recent history of fevers, cough, chills, diarrhea or dysuria. She is asymptomatic from the leukopenia. I will observe for now.  I will continue the chemotherapy at current dose without dosage adjustment.  If the leukopenia gets progressive worse in the future, I might have to delay her treatment or adjust the chemotherapy dose.   

## 2016-12-01 NOTE — Assessment & Plan Note (Signed)
She developed significant nausea and vomiting with recent chemotherapy Her blood work is satisfactory We will proceed with treatment without dose adjustment

## 2016-12-01 NOTE — Progress Notes (Signed)
[place] OFFICE PROGRESS NOTE  Patient Care Team: Patient, No Pcp Per as PCP - General (General Practice)  SUMMARY OF ONCOLOGIC HISTORY:   Adenocarcinoma (GYN origin)   09/01/2016 Initial Diagnosis    Adenocarcinoma (GYN origin)      Cervical cancer ([place])   08/26/2016 Pathology Results    Adequacy Reason Satisfactory for evaluation, endocervical/transformation zone component PRESENT. Diagnosis ATYPICAL GLANDULAR CELLS. SEE COMMENT. COMMENT: THERE ARE GROUPS OF ATYPICAL GLANDULAR CELLS WHICH APPEAR TO BE OF ENDOCERVICAL ORIGIN. TISSUE STUDIES, SUCH AS A CURETTAGE, MAY HELP BETTER EVALUATE THE EXTENT AND SEVERITY OF THESE ATYPICAL CELLS.      09/01/2016 Initial Diagnosis    The patient reports she is not had Pap smears in the "many years". She presented initially with flank pain to the emergency room on 07/29/2016. She was treated for a UTI and had resolution of her flank pain although she continued to have suprapubic pain. She has had irregular bleeding and a copious discharge for several months. 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 Microscopic Comment Immunohistochemistry is attempted to determine endocervical vs. endometrial origin. CEA is focally positive, p16 is diffusely strongly positive, ER is weakly positive, and vimentin is positive. This profile is not specific as to origin and clinical correlation is recommended. Dr. Avis Epley has reviewed the case. The case was called to Dr. Ilda Basset on 09/06/2016      10/05/2016 Imaging    Ct abdomen: 4.7 cm heterogeneous enhancing mass in the cervix and lower uterine segment, consistent with known cervical carcinoma. Mild retroperitoneal lymphadenopathy in the inferior aortocaval space and right common iliac chain, suspicious for  metastatic disease.      10/29/2016 Procedure    Ultrasound and fluoroscopically guided right internal jugular single lumen power port catheter insertion. Tip in the SVC/RA junction. Catheter ready for use      11/09/2016 -  Radiation Therapy    She receives concurrent radiation treatment      11/09/2016 -  Chemotherapy    She receives weekly cisplatin       INTERVAL HISTORY: Please see below for problem oriented charting. She is seen in the infusion room. She continues to have profound nausea and vomiting but much improved compared to last visit. Denies constipation. She denies excessive menstruation.  She is attempting to quit smoking. Denies peripheral neuropathy or hearing problems with treatment  REVIEW OF SYSTEMS:   Constitutional: Denies fevers, chills or abnormal weight loss Eyes: Denies blurriness of vision Ears, nose, mouth, throat, and face: Denies mucositis or sore throat Respiratory: Denies cough, dyspnea or wheezes Cardiovascular: Denies palpitation, chest discomfort or lower extremity swelling Skin: Denies abnormal skin rashes Lymphatics: Denies new lymphadenopathy or easy bruising Neurological:Denies numbness, tingling or new weaknesses Behavioral/Psych: Mood is stable, no new changes  All other systems were reviewed with the patient and are negative.  I have reviewed the past medical history, past surgical history, social history and family history with the patient and they are unchanged from previous note.  ALLERGIES:  is allergic to amoxicillin and penicillins.  MEDICATIONS:  Current Outpatient Prescriptions  Medication Sig Dispense Refill  . buPROPion (WELLBUTRIN) 100 MG tablet Take 1 tablet (100 mg total) by mouth 2 (two) times daily. 60 tablet 1  . dexamethasone (DECADRON) 4 MG tablet Take 2  tablets daily with breakfast for 2 days after each chemo treatment, every week 30 tablet 1  . hyaluronate sodium (RADIAPLEXRX) GEL Apply 1 application topically 2  (two) times daily.    Marland Kitchen lidocaine-prilocaine (EMLA) cream Apply to affected area once 30 g 3  . LORazepam (ATIVAN) 0.5 MG tablet Take 1 tablet (0.5 mg total) by mouth every 8 (eight) hours. As Needed for anxiety or nausea 60 tablet 0  . morphine (MSIR) 15 MG tablet Take 1 tablet (15 mg total) by mouth every 6 (six) hours as needed for severe pain. 60 tablet 0  . ondansetron (ZOFRAN) 8 MG tablet Take 1 tablet (8 mg total) by mouth 2 (two) times daily as needed. Start on the third day after chemotherapy. 30 tablet 1  . prochlorperazine (COMPAZINE) 10 MG tablet Take 1 tablet (10 mg total) by mouth every 6 (six) hours as needed (Nausea or vomiting). 30 tablet 1   No current facility-administered medications for this visit.    Facility-Administered Medications Ordered in Other Visits  Medication Dose Route Frequency Provider Last Rate Last Dose  . 0.9 %  sodium chloride infusion   Intravenous Once Alvy Bimler, Balinda Heacock, MD        PHYSICAL EXAMINATION: ECOG PERFORMANCE STATUS: 1 - Symptomatic but completely ambulatory  Vitals:   12/01/16 0849  BP: 110/76  Pulse: 83  Resp: 18  Temp: 97.8 F (36.6 C)   There were no vitals filed for this visit.  GENERAL:alert, no distress and comfortable SKIN: skin color, texture, turgor are normal, no rashes or significant lesions EYES: normal, Conjunctiva are pink and non-injected, sclera clear OROPHARYNX:no exudate, no erythema and lips, buccal mucosa, and tongue normal  NECK: supple, thyroid normal size, non-tender, without nodularity LYMPH:  no palpable lymphadenopathy in the cervical, axillary or inguinal LUNGS: clear to auscultation and percussion with normal breathing effort HEART: regular rate & rhythm and no murmurs and no lower extremity edema ABDOMEN:abdomen soft, non-tender and normal bowel sounds Musculoskeletal:no cyanosis of digits and no clubbing  NEURO: alert & oriented x 3 with fluent speech, no focal motor/sensory deficits  LABORATORY DATA:   I have reviewed the data as listed    Component Value Date/Time   NA 138 11/29/2016 1350   K 4.0 11/29/2016 1350   CL 103 10/29/2016 1322   CO2 27 11/29/2016 1350   GLUCOSE 104 11/29/2016 1350   BUN 18.8 11/29/2016 1350   CREATININE 0.8 11/29/2016 1350   CALCIUM 9.3 11/29/2016 1350   PROT 6.8 11/29/2016 1350   ALBUMIN 3.8 11/29/2016 1350   AST 10 11/29/2016 1350   ALT 17 11/29/2016 1350   ALKPHOS 50 11/29/2016 1350   BILITOT 0.41 11/29/2016 1350   GFRNONAA >60 10/29/2016 1322   GFRAA >60 10/29/2016 1322    No results found for: SPEP, UPEP  Lab Results  Component Value Date   WBC 2.8 (L) 11/29/2016   NEUTROABS 2.1 11/29/2016   HGB 12.6 11/29/2016   HCT 37.6 11/29/2016   MCV 86.1 11/29/2016   PLT 193 11/29/2016      Chemistry      Component Value Date/Time   NA 138 11/29/2016 1350   K 4.0 11/29/2016 1350   CL 103 10/29/2016 1322   CO2 27 11/29/2016 1350   BUN 18.8 11/29/2016 1350   CREATININE 0.8 11/29/2016 1350      Component Value Date/Time   CALCIUM 9.3 11/29/2016 1350   ALKPHOS 50 11/29/2016 1350   AST 10 11/29/2016 1350   ALT  17 11/29/2016 1350   BILITOT 0.41 11/29/2016 1350       RADIOGRAPHIC STUDIES: I have personally reviewed the radiological images as listed and agreed with the findings in the report. Nm Pet Image Initial (pi) Skull Base To Thigh  Result Date: 11/01/2016 CLINICAL DATA:  Initial treatment strategy for cervical cancer with enlarged lymph nodes. EXAM: NUCLEAR MEDICINE PET SKULL BASE TO THIGH TECHNIQUE: 7.2 mCi F-18 FDG was injected intravenously. Full-ring PET imaging was performed from the skull base to thigh after the radiotracer. CT data was obtained and used for attenuation correction and anatomic localization. FASTING BLOOD GLUCOSE:  Value: 109 mg/dl COMPARISON:  CT scan 10/05/2016 FINDINGS: NECK Relatively symmetric tonsillar activity most likely physiologic. Similar physiologic activity in the glottis. A lymph node at the border of  level IV and the supraclavicular chain on the left measuring 9 mm in short axis on image 37/4 news mildly but abnormally hypermetabolic with maximum SUV 4.6. CHEST No hypermetabolic mediastinal or hilar nodes. No suspicious pulmonary nodules on the CT dated. Right subclavian catheter tip in the right atrium, small amount of gas adjacent to the catheter likely related to recent placement. Background mediastinal blood pool activity 2.4. ABDOMEN/PELVIS Hypermetabolic bilateral common iliac chain adenopathy with small hypermetabolic foci also along the margins of both ovaries. Intense hypermetabolic activity in the cervix, somewhat difficult to measure due to the proximity of the urinary bladder, but with maximum SUV of approximately 17.5 Inferior aortocaval lymph node measuring 8 mm in short axis on image 123/4 is not appreciably hypermetabolic. An index right common iliac node measuring 1.1 cm in short axis on image 138/4 has a maximum SUV of 8.1. The other lymph nodes in this vicinity have similar activity. Indistinct activity along the periphery of the left ovary appears to be separate from the ureter, and has a maximum standard uptake value of 6.5. Activity along the upper medial margin of the right ovary is thought to probably relate to adjacent loops of collapsed bowel. There is mild activity along the perineum likely from mild incontinence. SKELETON No focal hypermetabolic activity to suggest skeletal metastasis. 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 IMPRESSION: 1. Intensely hypermetabolic cervical mass, with hypermetabolic adenopathy in the common iliac chains, as well as a faintly but abnormally hypermetabolic left supraclavicular lymph node. 2. There is some indistinct accentuated activity along the periphery of the left ovary with maximum SUV 6.5, if the ovaries not removed in the context of therapy for the cervical cancer, surveillance would likely be indicated. 3. 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 Electronically Signed   By: Van Clines M.D.   On: 11/01/2016 15:50    ASSESSMENT & PLAN:  Cervical cancer ([place]) She developed significant nausea and vomiting with recent chemotherapy Her blood work is satisfactory We will proceed with treatment without dose adjustment   Tobacco abuse she appears motivated to quit. Wellbutrin is helping and she is smoking less.  I congratulated her efforts  Chemotherapy-induced nausea She has multifactorial nausea likely due to chemotherapy and recent severe constipation In addition to her current anti-emetics, I would recommend dexamethasone 8 mg daily for next 2 days after each treatment I would also recommend she take lorazepam before radiation treatment We have extensive discussion about laxative therapy  Leukopenia due to antineoplastic chemotherapy [facility]) This is likely due to recent treatment. The patient denies recent history of fevers, cough, chills, diarrhea or dysuria. She is asymptomatic from the leukopenia.  I will observe for now.  I will continue the chemotherapy at current dose without dosage adjustment.  If the leukopenia gets progressive worse in the future, I might have to delay her treatment or adjust the chemotherapy dose.     No orders of the defined types were placed in this encounter.  All questions were answered. The patient knows to call the clinic with any problems, questions or concerns. No barriers to learning was detected. I spent 15 minutes counseling the patient face to face. The total time spent in the appointment was 20 minutes and more than 50% was on counseling and review of test results     Heath Lark, MD 12/01/2016 9:07 AM

## 2016-12-02 ENCOUNTER — Ambulatory Visit
Admission: RE | Admit: 2016-12-02 | Discharge: 2016-12-02 | Disposition: A | Discharge status: Home / Self Care | Payer: Medicaid Other | Source: Ambulatory Visit | Attending: Radiation Oncology | Admitting: Radiation Oncology

## 2016-12-02 ENCOUNTER — Ambulatory Visit: Payer: Medicaid Other

## 2016-12-02 DIAGNOSIS — Z510 Encounter for antineoplastic radiation therapy: Secondary | ICD-10-CM | POA: Diagnosis not present

## 2016-12-03 ENCOUNTER — Ambulatory Visit
Admission: RE | Admit: 2016-12-03 | Discharge: 2016-12-03 | Disposition: A | Discharge status: Home / Self Care | Payer: Medicaid Other | Source: Ambulatory Visit | Attending: Radiation Oncology | Admitting: Radiation Oncology

## 2016-12-03 DIAGNOSIS — Z510 Encounter for antineoplastic radiation therapy: Secondary | ICD-10-CM | POA: Diagnosis not present

## 2016-12-06 ENCOUNTER — Ambulatory Visit
Admission: RE | Admit: 2016-12-06 | Discharge: 2016-12-06 | Disposition: A | Discharge status: Home / Self Care | Payer: Medicaid Other | Source: Ambulatory Visit | Attending: Radiation Oncology | Admitting: Radiation Oncology

## 2016-12-06 ENCOUNTER — Other Ambulatory Visit (HOSPITAL_BASED_OUTPATIENT_CLINIC_OR_DEPARTMENT_OTHER): Payer: Medicaid Other

## 2016-12-06 DIAGNOSIS — C530 Malignant neoplasm of endocervix: Secondary | ICD-10-CM

## 2016-12-06 DIAGNOSIS — C539 Malignant neoplasm of cervix uteri, unspecified: Secondary | ICD-10-CM

## 2016-12-06 DIAGNOSIS — Z510 Encounter for antineoplastic radiation therapy: Secondary | ICD-10-CM | POA: Diagnosis not present

## 2016-12-06 LAB — CBC WITH DIFFERENTIAL/PLATELET
BASO%: 0.3 % (ref 0.0–2.0)
Basophils Absolute: 0 10*3/uL (ref 0.0–0.1)
EOS%: 4.3 % (ref 0.0–7.0)
Eosinophils Absolute: 0.1 10*3/uL (ref 0.0–0.5)
HCT: 37.5 % (ref 34.8–46.6)
HGB: 12.6 g/dL (ref 11.6–15.9)
LYMPH%: 16.3 % (ref 14.0–49.7)
MCH: 28.8 pg (ref 25.1–34.0)
MCHC: 33.6 g/dL (ref 31.5–36.0)
MCV: 85.7 fL (ref 79.5–101.0)
MONO#: 0.2 10*3/uL (ref 0.1–0.9)
MONO%: 8.7 % (ref 0.0–14.0)
NEUT#: 1.9 10*3/uL (ref 1.5–6.5)
NEUT%: 70.4 % (ref 38.4–76.8)
PLATELETS: 139 10*3/uL — AB (ref 145–400)
RBC: 4.38 10*6/uL (ref 3.70–5.45)
RDW: 13.3 % (ref 11.2–14.5)
WBC: 2.7 10*3/uL — ABNORMAL LOW (ref 3.9–10.3)
lymph#: 0.4 10*3/uL — ABNORMAL LOW (ref 0.9–3.3)

## 2016-12-06 LAB — MAGNESIUM: MAGNESIUM: 2.1 mg/dL (ref 1.5–2.5)

## 2016-12-06 LAB — COMPREHENSIVE METABOLIC PANEL
ALBUMIN: 3.7 g/dL (ref 3.5–5.0)
ALK PHOS: 49 U/L (ref 40–150)
ALT: 18 U/L (ref 0–55)
AST: 10 U/L (ref 5–34)
Anion Gap: 9 mEq/L (ref 3–11)
BUN: 11.9 mg/dL (ref 7.0–26.0)
CALCIUM: 9.9 mg/dL (ref 8.4–10.4)
CO2: 26 mEq/L (ref 22–29)
Chloride: 103 mEq/L (ref 98–109)
Creatinine: 0.8 mg/dL (ref 0.6–1.1)
EGFR: >90 mL/min/{1.73_m2} (ref >90)
Glucose: 130 mg/dl (ref 70–140)
Potassium: 3.5 mEq/L (ref 3.5–5.1)
Sodium: 139 mEq/L (ref 136–145)
TOTAL PROTEIN: 6.9 g/dL (ref 6.4–8.3)
Total Bilirubin: 0.32 mg/dL (ref 0.20–1.20)

## 2016-12-07 ENCOUNTER — Ambulatory Visit: Payer: Medicaid Other | Admitting: Radiation Oncology

## 2016-12-07 ENCOUNTER — Ambulatory Visit
Admission: RE | Admit: 2016-12-07 | Discharge: 2016-12-07 | Disposition: A | Discharge status: Home / Self Care | Payer: Medicaid Other | Source: Ambulatory Visit | Attending: Radiation Oncology | Admitting: Radiation Oncology

## 2016-12-07 ENCOUNTER — Other Ambulatory Visit: Payer: Self-pay | Admitting: Hematology and Oncology

## 2016-12-07 ENCOUNTER — Encounter: Payer: Self-pay | Admitting: Radiation Oncology

## 2016-12-07 ENCOUNTER — Other Ambulatory Visit: Payer: Self-pay | Admitting: Radiation Oncology

## 2016-12-07 DIAGNOSIS — Z510 Encounter for antineoplastic radiation therapy: Secondary | ICD-10-CM | POA: Diagnosis not present

## 2016-12-07 DIAGNOSIS — C530 Malignant neoplasm of endocervix: Secondary | ICD-10-CM

## 2016-12-07 MED ORDER — BIAFINE EX EMUL
Freq: Once | CUTANEOUS | Status: AC
  Administered 2016-12-07: 16:00:00 via TOPICAL

## 2016-12-07 MED ORDER — FLUCONAZOLE 150 MG PO TABS: 150.0000 mg | ORAL_TABLET | Freq: Every day | ORAL | 0 refills | Status: DC

## 2016-12-08 ENCOUNTER — Ambulatory Visit (HOSPITAL_BASED_OUTPATIENT_CLINIC_OR_DEPARTMENT_OTHER): Payer: Medicaid Other | Admitting: Hematology and Oncology

## 2016-12-08 ENCOUNTER — Ambulatory Visit (HOSPITAL_BASED_OUTPATIENT_CLINIC_OR_DEPARTMENT_OTHER): Payer: Medicaid Other

## 2016-12-08 ENCOUNTER — Other Ambulatory Visit: Payer: Self-pay | Admitting: Radiation Oncology

## 2016-12-08 ENCOUNTER — Ambulatory Visit
Admission: RE | Admit: 2016-12-08 | Discharge: 2016-12-08 | Disposition: A | Discharge status: Home / Self Care | Payer: Medicaid Other | Source: Ambulatory Visit | Attending: Radiation Oncology | Admitting: Radiation Oncology

## 2016-12-08 VITALS — BP 128/49 | HR 98 | Temp 97.9°F | Resp 18

## 2016-12-08 DIAGNOSIS — H9312 Tinnitus, left ear: Secondary | ICD-10-CM | POA: Diagnosis not present

## 2016-12-08 DIAGNOSIS — C539 Malignant neoplasm of cervix uteri, unspecified: Secondary | ICD-10-CM | POA: Diagnosis not present

## 2016-12-08 DIAGNOSIS — Z510 Encounter for antineoplastic radiation therapy: Secondary | ICD-10-CM | POA: Diagnosis not present

## 2016-12-08 DIAGNOSIS — Z720 Tobacco use: Secondary | ICD-10-CM | POA: Diagnosis not present

## 2016-12-08 DIAGNOSIS — T451X5A Adverse effect of antineoplastic and immunosuppressive drugs, initial encounter: Secondary | ICD-10-CM

## 2016-12-08 DIAGNOSIS — Z5111 Encounter for antineoplastic chemotherapy: Secondary | ICD-10-CM

## 2016-12-08 DIAGNOSIS — R110 Nausea: Secondary | ICD-10-CM

## 2016-12-08 DIAGNOSIS — G893 Neoplasm related pain (acute) (chronic): Secondary | ICD-10-CM | POA: Diagnosis not present

## 2016-12-08 DIAGNOSIS — F418 Other specified anxiety disorders: Secondary | ICD-10-CM

## 2016-12-08 DIAGNOSIS — C530 Malignant neoplasm of endocervix: Secondary | ICD-10-CM

## 2016-12-08 MED ORDER — PALONOSETRON HCL INJECTION 0.25 MG/5ML
INTRAVENOUS | Status: AC
Start: 2016-12-08 — End: 2016-12-08
  Filled 2016-12-08: qty 5

## 2016-12-08 MED ORDER — PALONOSETRON HCL INJECTION 0.25 MG/5ML
0.2500 mg | Freq: Once | INTRAVENOUS | Status: AC
  Administered 2016-12-08: 0.25 mg via INTRAVENOUS

## 2016-12-08 MED ORDER — SODIUM CHLORIDE 0.9 % IV SOLN
Freq: Once | INTRAVENOUS | Status: AC
  Administered 2016-12-08: 12:00:00 via INTRAVENOUS
  Filled 2016-12-08: qty 5

## 2016-12-08 MED ORDER — POTASSIUM CHLORIDE 2 MEQ/ML IV SOLN
Freq: Once | INTRAVENOUS | Status: AC
  Administered 2016-12-08: 09:00:00 via INTRAVENOUS
  Filled 2016-12-08: qty 10

## 2016-12-08 MED ORDER — HEPARIN SOD (PORK) LOCK FLUSH 100 UNIT/ML IV SOLN
500.0000 [IU] | Freq: Once | INTRAVENOUS | Status: AC | PRN
  Administered 2016-12-08: 500 [IU]
  Filled 2016-12-08: qty 5

## 2016-12-08 MED ORDER — SODIUM CHLORIDE 0.9 % IV SOLN
Freq: Once | INTRAVENOUS | Status: AC
  Administered 2016-12-08: 08:00:00 via INTRAVENOUS

## 2016-12-08 MED ORDER — LORAZEPAM 0.5 MG PO TABS: 0.5000 mg | ORAL_TABLET | Freq: Three times a day (TID) | ORAL | 0 refills | Status: DC

## 2016-12-08 MED ORDER — SODIUM CHLORIDE 0.9 % IV SOLN
40.0000 mg/m2 | Freq: Once | INTRAVENOUS | Status: AC
  Administered 2016-12-08: 68 mg via INTRAVENOUS
  Filled 2016-12-08: qty 68

## 2016-12-08 MED ORDER — MORPHINE SULFATE 15 MG PO TABS: 15.0000 mg | ORAL_TABLET | Freq: Four times a day (QID) | ORAL | 0 refills | Status: DC | PRN

## 2016-12-08 MED ORDER — SODIUM CHLORIDE 0.9% FLUSH
10.0000 mL | INTRAVENOUS | Status: DC | PRN
  Administered 2016-12-08: 10 mL
  Filled 2016-12-08: qty 10

## 2016-12-08 MED FILL — FLUCONAZOLE 150 MG TABLET: 150 | 3 days supply | Qty: 2 | Fill #0

## 2016-12-08 NOTE — Patient Instructions (Signed)
[facility] Discharge Instructions for Patients Receiving Chemotherapy  Today you received the following chemotherapy agents Cisplatin  To help prevent nausea and vomiting after your treatment, we encourage you to take your nausea medication as needed   If you develop nausea and vomiting that is not controlled by your nausea medication, call the clinic.   BELOW ARE SYMPTOMS THAT SHOULD BE REPORTED IMMEDIATELY:  *FEVER GREATER THAN 100.5 F  *CHILLS WITH OR WITHOUT FEVER  NAUSEA AND VOMITING THAT IS NOT CONTROLLED WITH YOUR NAUSEA MEDICATION  *UNUSUAL SHORTNESS OF BREATH  *UNUSUAL BRUISING OR BLEEDING  TENDERNESS IN MOUTH AND THROAT WITH OR WITHOUT PRESENCE OF ULCERS  *URINARY PROBLEMS  *BOWEL PROBLEMS  UNUSUAL RASH Items with * indicate a potential emergency and should be followed up as soon as possible.  Feel free to call the clinic you have any questions or concerns. The clinic phone number is [phone].  Please show the [place] at check-in to the Emergency Department and triage nurse.

## 2016-12-09 ENCOUNTER — Encounter: Payer: Self-pay | Admitting: Hematology and Oncology

## 2016-12-09 ENCOUNTER — Ambulatory Visit
Admission: RE | Admit: 2016-12-09 | Discharge: 2016-12-09 | Disposition: A | Discharge status: Home / Self Care | Payer: Medicaid Other | Source: Ambulatory Visit | Attending: Radiation Oncology | Admitting: Radiation Oncology

## 2016-12-09 DIAGNOSIS — Z510 Encounter for antineoplastic radiation therapy: Secondary | ICD-10-CM | POA: Diagnosis not present

## 2016-12-09 NOTE — Assessment & Plan Note (Signed)
she appears motivated to quit. Wellbutrin is helping and she is smoking less.  I congratulated her efforts

## 2016-12-09 NOTE — Assessment & Plan Note (Signed)
She has mild tinnitus from chemo This is mild I recommend continue treatment without dose adjustment

## 2016-12-09 NOTE — Assessment & Plan Note (Signed)
She developed significant nausea and vomiting with recent chemotherapy Her blood work is satisfactory We will proceed with treatment without dose adjustment

## 2016-12-09 NOTE — Assessment & Plan Note (Signed)
Her pelvic pain is well controlled with current prescription morphine sulfate. I refill her prescription today

## 2016-12-09 NOTE — Assessment & Plan Note (Signed)
She has signs and symptoms of anxiety and depression She is not suicidal She felt that Wellbutrin is giving her a sense of well-being.  She will continue the same. She will also take lorazepam as needed

## 2016-12-09 NOTE — Progress Notes (Signed)
[place] OFFICE PROGRESS NOTE  Patient Care Team: Patient, No Pcp Per as PCP - General (General Practice)  SUMMARY OF ONCOLOGIC HISTORY:   Adenocarcinoma (GYN origin)   09/01/2016 Initial Diagnosis    Adenocarcinoma (GYN origin)      Cervical cancer ([place])   08/26/2016 Pathology Results    Adequacy Reason Satisfactory for evaluation, endocervical/transformation zone component PRESENT. Diagnosis ATYPICAL GLANDULAR CELLS. SEE COMMENT. COMMENT: THERE ARE GROUPS OF ATYPICAL GLANDULAR CELLS WHICH APPEAR TO BE OF ENDOCERVICAL ORIGIN. TISSUE STUDIES, SUCH AS A CURETTAGE, MAY HELP BETTER EVALUATE THE EXTENT AND SEVERITY OF THESE ATYPICAL CELLS.      09/01/2016 Initial Diagnosis    The patient reports she is not had Pap smears in the "many years". She presented initially with flank pain to the emergency room on 07/29/2016. She was treated for a UTI and had resolution of her flank pain although she continued to have suprapubic pain. She has had irregular bleeding and a copious discharge for several months. 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 Microscopic Comment Immunohistochemistry is attempted to determine endocervical vs. endometrial origin. CEA is focally positive, p16 is diffusely strongly positive, ER is weakly positive, and vimentin is positive. This profile is not specific as to origin and clinical correlation is recommended. Dr. Avis Epley has reviewed the case. The case was called to Dr. Ilda Basset on 09/06/2016      10/05/2016 Imaging    Ct abdomen: 4.7 cm heterogeneous enhancing mass in the cervix and lower uterine segment, consistent with known cervical carcinoma. Mild retroperitoneal lymphadenopathy in the inferior aortocaval space and right common iliac chain, suspicious for  metastatic disease.      10/29/2016 Procedure    Ultrasound and fluoroscopically guided right internal jugular single lumen power port catheter insertion. Tip in the SVC/RA junction. Catheter ready for use      11/09/2016 -  Radiation Therapy    She receives concurrent radiation treatment      11/09/2016 -  Chemotherapy    She receives weekly cisplatin       INTERVAL HISTORY: Please see below for problem oriented charting. She is seen in the treatment room. She is doing well She has vague vaginal spotting Her pelvic pain is well controlled with morphine sulfate 2-3 times a day She had minimum tinnitus She denies peripheral neuropathy She is still smoking but attempting to quit smoking Nausea is well controlled She denies recent constipation Her mood is stable. She denies mucositis  REVIEW OF SYSTEMS:   Constitutional: Denies fevers, chills or abnormal weight loss Eyes: Denies blurriness of vision Ears, nose, mouth, throat, and face: Denies mucositis or sore throat Respiratory: Denies cough, dyspnea or wheezes Cardiovascular: Denies palpitation, chest discomfort or lower extremity swelling Gastrointestinal:  Denies nausea, heartburn or change in bowel habits Skin: Denies abnormal skin rashes Lymphatics: Denies new lymphadenopathy or easy bruising Neurological:Denies numbness, tingling or new weaknesses Behavioral/Psych: Mood is stable, no new changes  All other systems were reviewed with the patient and are negative.  I have reviewed the past medical history, past surgical history, social history and family history with the patient and they are unchanged from previous note.  ALLERGIES:  is allergic to amoxicillin and penicillins.  MEDICATIONS:  Current Outpatient Prescriptions  Medication Sig Dispense Refill  . buPROPion (  WELLBUTRIN) 100 MG tablet Take 1 tablet (100 mg total) by mouth 2 (two) times daily. 60 tablet 1  . dexamethasone (DECADRON) 4 MG tablet Take 2  tablets daily with breakfast for 2 days after each chemo treatment, every week 30 tablet 1  . fluconazole (DIFLUCAN) 150 MG tablet Take 1 tablet (150 mg total) by mouth daily. Take 1 tablet by mouth followed by a second tablet 72 hours later 2 tablet 0  . hyaluronate sodium (RADIAPLEXRX) GEL Apply 1 application topically 2 (two) times daily.    Marland Kitchen lidocaine-prilocaine (EMLA) cream Apply to affected area once 30 g 3  . LORazepam (ATIVAN) 0.5 MG tablet Take 1 tablet (0.5 mg total) by mouth every 8 (eight) hours. As Needed for anxiety or nausea 60 tablet 0  . morphine (MSIR) 15 MG tablet Take 1 tablet (15 mg total) by mouth every 6 (six) hours as needed for severe pain. 60 tablet 0  . ondansetron (ZOFRAN) 8 MG tablet Take 1 tablet (8 mg total) by mouth 2 (two) times daily as needed. Start on the third day after chemotherapy. 30 tablet 1  . prochlorperazine (COMPAZINE) 10 MG tablet Take 1 tablet (10 mg total) by mouth every 6 (six) hours as needed (Nausea or vomiting). 30 tablet 1   No current facility-administered medications for this visit.     PHYSICAL EXAMINATION: ECOG PERFORMANCE STATUS: 1 - Symptomatic but completely ambulatory GENERAL:alert, no distress and comfortable SKIN: skin color, texture, turgor are normal, no rashes or significant lesions EYES: normal, Conjunctiva are pink and non-injected, sclera clear OROPHARYNX:no exudate, no erythema and lips, buccal mucosa, and tongue normal  NECK: supple, thyroid normal size, non-tender, without nodularity LYMPH:  no palpable lymphadenopathy in the cervical, axillary or inguinal LUNGS: clear to auscultation and percussion with normal breathing effort HEART: regular rate & rhythm and no murmurs and no lower extremity edema ABDOMEN:abdomen soft, non-tender and normal bowel sounds Musculoskeletal:no cyanosis of digits and no clubbing  NEURO: alert & oriented x 3 with fluent speech, no focal motor/sensory deficits  LABORATORY DATA:  I have  reviewed the data as listed    Component Value Date/Time   NA 139 12/06/2016 1431   K 3.5 12/06/2016 1431   CL 103 10/29/2016 1322   CO2 26 12/06/2016 1431   GLUCOSE 130 12/06/2016 1431   BUN 11.9 12/06/2016 1431   CREATININE 0.8 12/06/2016 1431   CALCIUM 9.9 12/06/2016 1431   PROT 6.9 12/06/2016 1431   ALBUMIN 3.7 12/06/2016 1431   AST 10 12/06/2016 1431   ALT 18 12/06/2016 1431   ALKPHOS 49 12/06/2016 1431   BILITOT 0.32 12/06/2016 1431   GFRNONAA >60 10/29/2016 1322   GFRAA >60 10/29/2016 1322    No results found for: SPEP, UPEP  Lab Results  Component Value Date   WBC 2.7 (L) 12/06/2016   NEUTROABS 1.9 12/06/2016   HGB 12.6 12/06/2016   HCT 37.5 12/06/2016   MCV 85.7 12/06/2016   PLT 139 (L) 12/06/2016      Chemistry      Component Value Date/Time   NA 139 12/06/2016 1431   K 3.5 12/06/2016 1431   CL 103 10/29/2016 1322   CO2 26 12/06/2016 1431   BUN 11.9 12/06/2016 1431   CREATININE 0.8 12/06/2016 1431      Component Value Date/Time   CALCIUM 9.9 12/06/2016 1431   ALKPHOS 49 12/06/2016 1431   AST 10 12/06/2016 1431   ALT 18 12/06/2016 1431   BILITOT 0.32 12/06/2016  69      ASSESSMENT & PLAN:  Cervical cancer ([place]) She developed significant nausea and vomiting with recent chemotherapy Her blood work is satisfactory We will proceed with treatment without dose adjustment   Cancer associated pain Her pelvic pain is well controlled with current prescription morphine sulfate. I refill her prescription today  Tinnitus of left ear She has mild tinnitus from chemo This is mild I recommend continue treatment without dose adjustment  Tobacco abuse she appears motivated to quit. Wellbutrin is helping and she is smoking less.  I congratulated her efforts  Depression with anxiety She has signs and symptoms of anxiety and depression She is not suicidal She felt that Wellbutrin is giving her a sense of well-being.  She will continue the same. She will  also take lorazepam as needed  Chemotherapy-induced nausea She has multifactorial nausea likely due to chemotherapy and recent severe constipation In addition to her current anti-emetics, I would recommend dexamethasone 8 mg daily for next 2 days after each treatment I would also recommend she take lorazepam before radiation treatment We have extensive discussion about laxative therapy   No orders of the defined types were placed in this encounter.  All questions were answered. The patient knows to call the clinic with any problems, questions or concerns. No barriers to learning was detected. I spent 20 minutes counseling the patient face to face. The total time spent in the appointment was 25 minutes and more than 50% was on counseling and review of test results     Heath Lark, MD 12/09/2016 10:11 AM

## 2016-12-09 NOTE — Assessment & Plan Note (Signed)
She has multifactorial nausea likely due to chemotherapy and recent severe constipation In addition to her current anti-emetics, I would recommend dexamethasone 8 mg daily for next 2 days after each treatment I would also recommend she take lorazepam before radiation treatment We have extensive discussion about laxative therapy

## 2016-12-10 ENCOUNTER — Ambulatory Visit
Admission: RE | Admit: 2016-12-10 | Discharge: 2016-12-10 | Disposition: A | Discharge status: Home / Self Care | Payer: Medicaid Other | Source: Ambulatory Visit | Attending: Radiation Oncology | Admitting: Radiation Oncology

## 2016-12-10 DIAGNOSIS — Z510 Encounter for antineoplastic radiation therapy: Secondary | ICD-10-CM | POA: Diagnosis not present

## 2016-12-13 ENCOUNTER — Ambulatory Visit
Admission: RE | Admit: 2016-12-13 | Discharge: 2016-12-13 | Disposition: A | Discharge status: Home / Self Care | Payer: Medicaid Other | Source: Ambulatory Visit | Attending: Radiation Oncology | Admitting: Radiation Oncology

## 2016-12-13 ENCOUNTER — Other Ambulatory Visit (HOSPITAL_BASED_OUTPATIENT_CLINIC_OR_DEPARTMENT_OTHER): Payer: Medicaid Other

## 2016-12-13 ENCOUNTER — Ambulatory Visit: Admission: RE | Admit: 2016-12-13 | Payer: Medicaid Other | Source: Ambulatory Visit

## 2016-12-13 ENCOUNTER — Telehealth: Payer: Self-pay | Admitting: *Deleted

## 2016-12-13 DIAGNOSIS — C530 Malignant neoplasm of endocervix: Secondary | ICD-10-CM

## 2016-12-13 DIAGNOSIS — C539 Malignant neoplasm of cervix uteri, unspecified: Secondary | ICD-10-CM

## 2016-12-13 DIAGNOSIS — Z510 Encounter for antineoplastic radiation therapy: Secondary | ICD-10-CM | POA: Diagnosis not present

## 2016-12-13 LAB — CBC WITH DIFFERENTIAL/PLATELET
BASO%: 0.3 % (ref 0.0–2.0)
Basophils Absolute: 0 10*3/uL (ref 0.0–0.1)
EOS ABS: 0.1 10*3/uL (ref 0.0–0.5)
EOS%: 5.8 % (ref 0.0–7.0)
HCT: 32.8 % — ABNORMAL LOW (ref 34.8–46.6)
HGB: 11 g/dL — ABNORMAL LOW (ref 11.6–15.9)
LYMPH%: 23.6 % (ref 14.0–49.7)
MCH: 28.9 pg (ref 25.1–34.0)
MCHC: 33.5 g/dL (ref 31.5–36.0)
MCV: 86.3 fL (ref 79.5–101.0)
MONO#: 0.1 10*3/uL (ref 0.1–0.9)
MONO%: 7.7 % (ref 0.0–14.0)
NEUT#: 0.7 10*3/uL — ABNORMAL LOW (ref 1.5–6.5)
NEUT%: 62.6 % (ref 38.4–76.8)
PLATELETS: 103 10*3/uL — AB (ref 145–400)
RBC: 3.8 10*6/uL (ref 3.70–5.45)
RDW: 13.6 % (ref 11.2–14.5)
WBC: 1.1 10*3/uL — ABNORMAL LOW (ref 3.9–10.3)
lymph#: 0.3 10*3/uL — ABNORMAL LOW (ref 0.9–3.3)

## 2016-12-13 LAB — COMPREHENSIVE METABOLIC PANEL
ALT: 15 U/L (ref 0–55)
AST: 10 U/L (ref 5–34)
Albumin: 3.4 g/dL — ABNORMAL LOW (ref 3.5–5.0)
Alkaline Phosphatase: 50 U/L (ref 40–150)
Anion Gap: 9 mEq/L (ref 3–11)
BILIRUBIN TOTAL: 0.25 mg/dL (ref 0.20–1.20)
BUN: 13.4 mg/dL (ref 7.0–26.0)
CALCIUM: 9.6 mg/dL (ref 8.4–10.4)
CHLORIDE: 103 meq/L (ref 98–109)
CO2: 27 meq/L (ref 22–29)
CREATININE: 0.8 mg/dL (ref 0.6–1.1)
EGFR: >90 mL/min/{1.73_m2} (ref >90)
Glucose: 102 mg/dl (ref 70–140)
Potassium: 3.6 mEq/L (ref 3.5–5.1)
Sodium: 138 mEq/L (ref 136–145)
TOTAL PROTEIN: 6.8 g/dL (ref 6.4–8.3)

## 2016-12-13 LAB — MAGNESIUM: Magnesium: 1.8 mg/dl (ref 1.5–2.5)

## 2016-12-13 NOTE — Telephone Encounter (Signed)
Can you get scheduler to shorten her IVF time?

## 2016-12-13 NOTE — Telephone Encounter (Signed)
-----   Message from Heath Lark, MD sent at 12/13/2016  3:03 PM EDT ----- Regarding: pancytopenia Call her, labs showed pancytopenia, will cancel her chemo appt on Wednesday Please let her know, will cancel her chemo unless she has nausea, will keep for IV hydration  ----- Message ----- From: Interface, Lab In Three Zero One Sent: 12/13/2016   2:12 PM To: Heath Lark, MD

## 2016-12-13 NOTE — Telephone Encounter (Signed)
Pt states she is OK cancelling treatment. States she is not voiding a lot. Could probably use IVF. Will come at 11:30 to see Dr Alvy Bimler, with IVF after MD visit.

## 2016-12-14 ENCOUNTER — Ambulatory Visit
Admission: RE | Admit: 2016-12-14 | Discharge: 2016-12-14 | Disposition: A | Discharge status: Home / Self Care | Payer: Medicaid Other | Source: Ambulatory Visit | Attending: Radiation Oncology | Admitting: Radiation Oncology

## 2016-12-14 ENCOUNTER — Ambulatory Visit: Payer: Medicaid Other | Admitting: Radiation Oncology

## 2016-12-14 ENCOUNTER — Ambulatory Visit: Admission: RE | Admit: 2016-12-14 | Payer: Medicaid Other | Source: Ambulatory Visit

## 2016-12-14 DIAGNOSIS — C801 Malignant (primary) neoplasm, unspecified: Secondary | ICD-10-CM

## 2016-12-14 DIAGNOSIS — Z510 Encounter for antineoplastic radiation therapy: Secondary | ICD-10-CM | POA: Diagnosis not present

## 2016-12-14 MED ORDER — SILVER SULFADIAZINE 1 % EX CREA
TOPICAL_CREAM | Freq: Two times a day (BID) | CUTANEOUS | Status: DC
  Administered 2016-12-14: 15:00:00 via TOPICAL

## 2016-12-15 ENCOUNTER — Ambulatory Visit (HOSPITAL_BASED_OUTPATIENT_CLINIC_OR_DEPARTMENT_OTHER): Payer: Medicaid Other | Admitting: Hematology and Oncology

## 2016-12-15 ENCOUNTER — Ambulatory Visit: Payer: Medicaid Other

## 2016-12-15 ENCOUNTER — Encounter: Payer: Self-pay | Admitting: General Practice

## 2016-12-15 ENCOUNTER — Ambulatory Visit (HOSPITAL_BASED_OUTPATIENT_CLINIC_OR_DEPARTMENT_OTHER): Payer: Medicaid Other

## 2016-12-15 ENCOUNTER — Ambulatory Visit
Admission: RE | Admit: 2016-12-15 | Discharge: 2016-12-15 | Disposition: A | Discharge status: Home / Self Care | Payer: Medicaid Other | Source: Ambulatory Visit | Attending: Radiation Oncology | Admitting: Radiation Oncology

## 2016-12-15 VITALS — BP 98/57 | HR 86 | Resp 18

## 2016-12-15 DIAGNOSIS — Z720 Tobacco use: Secondary | ICD-10-CM | POA: Diagnosis not present

## 2016-12-15 DIAGNOSIS — T451X5A Adverse effect of antineoplastic and immunosuppressive drugs, initial encounter: Secondary | ICD-10-CM

## 2016-12-15 DIAGNOSIS — Z510 Encounter for antineoplastic radiation therapy: Secondary | ICD-10-CM | POA: Diagnosis not present

## 2016-12-15 DIAGNOSIS — C530 Malignant neoplasm of endocervix: Secondary | ICD-10-CM | POA: Diagnosis present

## 2016-12-15 DIAGNOSIS — G893 Neoplasm related pain (acute) (chronic): Secondary | ICD-10-CM | POA: Diagnosis not present

## 2016-12-15 DIAGNOSIS — Z95828 Presence of other vascular implants and grafts: Secondary | ICD-10-CM

## 2016-12-15 DIAGNOSIS — D701 Agranulocytosis secondary to cancer chemotherapy: Secondary | ICD-10-CM

## 2016-12-15 DIAGNOSIS — H9312 Tinnitus, left ear: Secondary | ICD-10-CM | POA: Diagnosis not present

## 2016-12-15 MED ORDER — HEPARIN SOD (PORK) LOCK FLUSH 100 UNIT/ML IV SOLN
500.0000 [IU] | Freq: Once | INTRAVENOUS | Status: AC | PRN
  Administered 2016-12-15: 500 [IU]
  Filled 2016-12-15: qty 5

## 2016-12-15 MED ORDER — SODIUM CHLORIDE 0.9% FLUSH
10.0000 mL | INTRAVENOUS | Status: DC | PRN
  Administered 2016-12-15: 10 mL
  Filled 2016-12-15: qty 10

## 2016-12-15 MED ORDER — PROMETHAZINE HCL 25 MG/ML IJ SOLN
25.0000 mg | Freq: Once | INTRAMUSCULAR | Status: AC
  Administered 2016-12-15: 25 mg via INTRAVENOUS
  Filled 2016-12-15: qty 1

## 2016-12-15 MED ORDER — ONDANSETRON HCL 8 MG PO TABS: 8.0000 mg | ORAL_TABLET | Freq: Three times a day (TID) | ORAL | 3 refills | Status: DC | PRN

## 2016-12-15 MED ORDER — SODIUM CHLORIDE 0.9 % IV SOLN
Freq: Once | INTRAVENOUS | Status: AC
  Administered 2016-12-15: 13:00:00 via INTRAVENOUS

## 2016-12-15 MED ORDER — PROCHLORPERAZINE MALEATE 10 MG PO TABS: 10.0000 mg | ORAL_TABLET | Freq: Four times a day (QID) | ORAL | 3 refills | Status: DC | PRN

## 2016-12-15 NOTE — Progress Notes (Signed)
[facility] Spiritual Care Note  Followed up with [name] and her mom in infusion.  She was heartened by not needing the last chemo tx and appears to be coping well even with the xrt burn in the clavicle area.  Overall she was in good spirits, citing good support from family and from everyone she meets at [facility].  The [facility] team's positivity keeps her uplifted and encouraged, giving her a sense of safety, value, and care.  Continuing to follow for support.  [name] plans to visit in my office when she is downstairs for xrt and may call in advance to set up an appointment for a longer conversation.   [place], [place], [facility] Pager [phone] Voicemail [phone]

## 2016-12-15 NOTE — Patient Instructions (Signed)
Dehydration, Adult Dehydration is when there is not enough fluid or water in your body. This happens when you lose more fluids than you take in. Dehydration can range from mild to very bad. It should be treated right away to keep it from getting very bad. Symptoms of mild dehydration may include:  Thirst.  Dry lips.  Slightly dry mouth.  Dry, warm skin.  Dizziness. Symptoms of moderate dehydration may include:  Very dry mouth.  Muscle cramps.  Dark pee (urine). Pee may be the color of tea.  Your body making less pee.  Your eyes making fewer tears.  Heartbeat that is uneven or faster than normal (palpitations).  Headache.  Light-headedness, especially when you stand up from sitting.  Fainting (syncope). Symptoms of very bad dehydration may include:  Changes in skin, such as: ? Cold and clammy skin. ? Blotchy (mottled) or pale skin. ? Skin that does not quickly return to normal after being lightly pinched and let go (poor skin turgor).  Changes in body fluids, such as: ? Feeling very thirsty. ? Your eyes making fewer tears. ? Not sweating when body temperature is high, such as in hot weather. ? Your body making very little pee.  Changes in vital signs, such as: ? Weak pulse. ? Pulse that is more than 100 beats a minute when you are sitting still. ? Fast breathing. ? Low blood pressure.  Other changes, such as: ? Sunken eyes. ? Cold hands and feet. ? Confusion. ? Lack of energy (lethargy). ? Trouble waking up from sleep. ? Short-term weight loss. ? Unconsciousness. Follow these instructions at home:  If told by your doctor, drink an ORS: ? Make an ORS by using instructions on the package. ? Start by drinking small amounts, about  cup (120 mL) every 5-10 minutes. ? Slowly drink more until you have had the amount that your doctor said to have.  Drink enough clear fluid to keep your pee clear or pale yellow. If you were told to drink an ORS, finish the ORS  first, then start slowly drinking clear fluids. Drink fluids such as: ? Water. Do not drink only water by itself. Doing that can make the salt (sodium) level in your body get too low (hyponatremia). ? Ice chips. ? Fruit juice that you have added water to (diluted). ? Low-calorie sports drinks.  Avoid: ? Alcohol. ? Drinks that have a lot of sugar. These include high-calorie sports drinks, fruit juice that does not have water added, and soda. ? Caffeine. ? Foods that are greasy or have a lot of fat or sugar.  Take over-the-counter and prescription medicines only as told by your doctor.  Do not take salt tablets. Doing that can make the salt level in your body get too high (hypernatremia).  Eat foods that have minerals (electrolytes). Examples include bananas, oranges, potatoes, tomatoes, and spinach.  Keep all follow-up visits as told by your doctor. This is important. Contact a doctor if:  You have belly (abdominal) pain that: ? Gets worse. ? Stays in one area (localizes).  You have a rash.  You have a stiff neck.  You get angry or annoyed more easily than normal (irritability).  You are more sleepy than normal.  You have a harder time waking up than normal.  You feel: ? Weak. ? Dizzy. ? Very thirsty.  You have peed (urinated) only a small amount of very dark pee during 6-8 hours. Get help right away if:  You have symptoms of   very bad dehydration.  You cannot drink fluids without throwing up (vomiting).  Your symptoms get worse with treatment.  You have a fever.  You have a very bad headache.  You are throwing up or having watery poop (diarrhea) and it: ? Gets worse. ? Does not go away.  You have blood or something green (bile) in your throw-up.  You have blood in your poop (stool). This may cause poop to look black and tarry.  You have not peed in 6-8 hours.  You pass out (faint).  Your heart rate when you are sitting still is more than 100 beats a  minute.  You have trouble breathing. This information is not intended to replace advice given to you by your health care provider. Make sure you discuss any questions you have with your health care provider. Document Released: 04/10/2009 Document Revised: 01/02/2016 Document Reviewed: 08/08/2015 Elsevier Interactive Patient Education  2018 Elsevier Inc.  

## 2016-12-16 ENCOUNTER — Ambulatory Visit: Payer: Medicaid Other

## 2016-12-16 ENCOUNTER — Ambulatory Visit
Admission: RE | Admit: 2016-12-16 | Discharge: 2016-12-16 | Disposition: A | Discharge status: Home / Self Care | Payer: Medicaid Other | Source: Ambulatory Visit | Attending: Radiation Oncology | Admitting: Radiation Oncology

## 2016-12-16 ENCOUNTER — Encounter: Payer: Self-pay | Admitting: Hematology and Oncology

## 2016-12-16 DIAGNOSIS — Z510 Encounter for antineoplastic radiation therapy: Secondary | ICD-10-CM | POA: Diagnosis not present

## 2016-12-16 NOTE — Progress Notes (Signed)
[place] OFFICE PROGRESS NOTE  Patient Care Team: Patient, No Pcp Per as PCP - General (General Practice)  SUMMARY OF ONCOLOGIC HISTORY:   Adenocarcinoma (GYN origin)   09/01/2016 Initial Diagnosis    Adenocarcinoma (GYN origin)      Cervical cancer ([place])   08/26/2016 Pathology Results    Adequacy Reason Satisfactory for evaluation, endocervical/transformation zone component PRESENT. Diagnosis ATYPICAL GLANDULAR CELLS. SEE COMMENT. COMMENT: THERE ARE GROUPS OF ATYPICAL GLANDULAR CELLS WHICH APPEAR TO BE OF ENDOCERVICAL ORIGIN. TISSUE STUDIES, SUCH AS A CURETTAGE, MAY HELP BETTER EVALUATE THE EXTENT AND SEVERITY OF THESE ATYPICAL CELLS.      09/01/2016 Initial Diagnosis    The patient reports she is not had Pap smears in the "many years". She presented initially with flank pain to the emergency room on 07/29/2016. She was treated for a UTI and had resolution of her flank pain although she continued to have suprapubic pain. She has had irregular bleeding and a copious discharge for several months. 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 Microscopic Comment Immunohistochemistry is attempted to determine endocervical vs. endometrial origin. CEA is focally positive, p16 is diffusely strongly positive, ER is weakly positive, and vimentin is positive. This profile is not specific as to origin and clinical correlation is recommended. Dr. Avis Epley has reviewed the case. The case was called to Dr. Ilda Basset on 09/06/2016      10/05/2016 Imaging    Ct abdomen: 4.7 cm heterogeneous enhancing mass in the cervix and lower uterine segment, consistent with known cervical carcinoma. Mild retroperitoneal lymphadenopathy in the inferior aortocaval space and right common iliac chain, suspicious for  metastatic disease.      10/29/2016 Procedure    Ultrasound and fluoroscopically guided right internal jugular single lumen power port catheter insertion. Tip in the SVC/RA junction. Catheter ready for use      11/09/2016 -  Radiation Therapy    She receives concurrent radiation treatment      11/09/2016 - 12/08/2016 Chemotherapy    She receives weekly cisplatin       INTERVAL HISTORY: Please see below for problem oriented charting. She returns for further follow-up She has stopped smoking recently She denies recent fevers or chills Has some mild nausea, resolved with antiemetics Denies constipation Her pelvic pain is well controlled She denies recent vaginal bleeding She complain of mild residual tinnitus  REVIEW OF SYSTEMS:   Constitutional: Denies fevers, chills or abnormal weight loss Eyes: Denies blurriness of vision Ears, nose, mouth, throat, and face: Denies mucositis or sore throat Respiratory: Denies cough, dyspnea or wheezes Cardiovascular: Denies palpitation, chest discomfort or lower extremity swelling Skin: Denies abnormal skin rashes Lymphatics: Denies new lymphadenopathy or easy bruising Neurological:Denies numbness, tingling or new weaknesses Behavioral/Psych: Mood is stable, no new changes  All other systems were reviewed with the patient and are negative.  I have reviewed the past medical history, past surgical history, social history and family history with the patient and they are unchanged from previous note.  ALLERGIES:  is allergic to amoxicillin and penicillins.  MEDICATIONS:  Current Outpatient Prescriptions  Medication Sig Dispense Refill  . buPROPion (WELLBUTRIN) 100 MG tablet Take 1 tablet (100 mg total) by mouth 2 (two) times daily. 60 tablet 1  . dexamethasone (DECADRON) 4 MG tablet Take 2 tablets  daily with breakfast for 2 days after each chemo treatment, every week 30 tablet 1  . hyaluronate sodium (RADIAPLEXRX) GEL Apply 1 application  topically 2 (two) times daily.    Marland Kitchen LORazepam (ATIVAN) 0.5 MG tablet Take 1 tablet (0.5 mg total) by mouth every 8 (eight) hours. As Needed for anxiety or nausea 60 tablet 0  . morphine (MSIR) 15 MG tablet Take 1 tablet (15 mg total) by mouth every 6 (six) hours as needed for severe pain. 60 tablet 0  . ondansetron (ZOFRAN) 8 MG tablet Take 1 tablet (8 mg total) by mouth every 8 (eight) hours as needed. 60 tablet 3  . prochlorperazine (COMPAZINE) 10 MG tablet Take 1 tablet (10 mg total) by mouth every 6 (six) hours as needed (Nausea or vomiting). 60 tablet 3  . silver sulfADIAZINE (SILVADENE) 1 % cream Apply 1 application topically daily.     No current facility-administered medications for this visit.     PHYSICAL EXAMINATION: ECOG PERFORMANCE STATUS: 1 - Symptomatic but completely ambulatory  Vitals:   12/15/16 1132  BP: 120/67  Pulse: 96  Resp: 18  Temp: 98.4 F (36.9 C)   Filed Weights   12/15/16 1132  Weight: 142 lb 6.4 oz (64.6 kg)    GENERAL:alert, no distress and comfortable SKIN: skin color, texture, turgor are normal, no rashes or significant lesions EYES: normal, Conjunctiva are pink and non-injected, sclera clear OROPHARYNX:no exudate, no erythema and lips, buccal mucosa, and tongue normal  NECK: supple, thyroid normal size, non-tender, without nodularity LYMPH:  no palpable lymphadenopathy in the cervical, axillary or inguinal LUNGS: clear to auscultation and percussion with normal breathing effort HEART: regular rate & rhythm and no murmurs and no lower extremity edema ABDOMEN:abdomen soft, non-tender and normal bowel sounds Musculoskeletal:no cyanosis of digits and no clubbing  NEURO: alert & oriented x 3 with fluent speech, no focal motor/sensory deficits  LABORATORY DATA:  I have reviewed the data as listed    Component Value Date/Time   NA 138 12/13/2016 1402   K 3.6 12/13/2016 1402   CL 103 10/29/2016 1322   CO2 27 12/13/2016 1402   GLUCOSE 102  12/13/2016 1402   BUN 13.4 12/13/2016 1402   CREATININE 0.8 12/13/2016 1402   CALCIUM 9.6 12/13/2016 1402   PROT 6.8 12/13/2016 1402   ALBUMIN 3.4 (L) 12/13/2016 1402   AST 10 12/13/2016 1402   ALT 15 12/13/2016 1402   ALKPHOS 50 12/13/2016 1402   BILITOT 0.25 12/13/2016 1402   GFRNONAA >60 10/29/2016 1322   GFRAA >60 10/29/2016 1322    No results found for: SPEP, UPEP  Lab Results  Component Value Date   WBC 1.1 (L) 12/13/2016   NEUTROABS 0.7 (L) 12/13/2016   HGB 11.0 (L) 12/13/2016   HCT 32.8 (L) 12/13/2016   MCV 86.3 12/13/2016   PLT 103 (L) 12/13/2016      Chemistry      Component Value Date/Time   NA 138 12/13/2016 1402   K 3.6 12/13/2016 1402   CL 103 10/29/2016 1322   CO2 27 12/13/2016 1402   BUN 13.4 12/13/2016 1402   CREATININE 0.8 12/13/2016 1402      Component Value Date/Time   CALCIUM 9.6 12/13/2016 1402   ALKPHOS 50 12/13/2016 1402   AST 10 12/13/2016 1402   ALT 15 12/13/2016 1402   BILITOT 0.25 12/13/2016 1402       ASSESSMENT & PLAN:  Cervical cancer ([place]) She developed significant nausea and vomiting and pancytopenia  with recent chemotherapy Her treatment is considered complete and I will continue to provide supportive care.  Leukopenia due to antineoplastic chemotherapy [facility]) This is likely due to recent treatment.  She is not symptomatic.  I discussed with her neutropenic precaution  Cancer associated pain Her pelvic pain is well controlled with current prescription morphine sulfate.  Tobacco abuse She had stop smoking.  I congratulated her efforts  Tinnitus of left ear She has mild tinnitus from chemo Observe only   No orders of the defined types were placed in this encounter.  All questions were answered. The patient knows to call the clinic with any problems, questions or concerns. No barriers to learning was detected. I spent 15 minutes counseling the patient face to face. The total time spent in the appointment was 20 minutes  and more than 50% was on counseling and review of test results     Heath Lark, MD 12/16/2016 3:30 PM

## 2016-12-16 NOTE — Assessment & Plan Note (Signed)
This is likely due to recent treatment.  She is not symptomatic.  I discussed with her neutropenic precaution

## 2016-12-16 NOTE — Assessment & Plan Note (Signed)
She had stop smoking.  I congratulated her efforts

## 2016-12-16 NOTE — Assessment & Plan Note (Signed)
She has mild tinnitus from chemo Observe only

## 2016-12-16 NOTE — Assessment & Plan Note (Signed)
Her pelvic pain is well controlled with current prescription morphine sulfate.

## 2016-12-16 NOTE — Assessment & Plan Note (Signed)
She developed significant nausea and vomiting and pancytopenia with recent chemotherapy Her treatment is considered complete and I will continue to provide supportive care.

## 2016-12-17 ENCOUNTER — Ambulatory Visit: Payer: Medicaid Other

## 2016-12-17 ENCOUNTER — Other Ambulatory Visit: Payer: Self-pay | Admitting: *Deleted

## 2016-12-17 ENCOUNTER — Ambulatory Visit
Admission: RE | Admit: 2016-12-17 | Discharge: 2016-12-17 | Disposition: A | Discharge status: Home / Self Care | Payer: Medicaid Other | Source: Ambulatory Visit | Attending: Radiation Oncology | Admitting: Radiation Oncology

## 2016-12-17 DIAGNOSIS — C801 Malignant (primary) neoplasm, unspecified: Secondary | ICD-10-CM

## 2016-12-17 DIAGNOSIS — Z510 Encounter for antineoplastic radiation therapy: Secondary | ICD-10-CM | POA: Diagnosis not present

## 2016-12-20 ENCOUNTER — Other Ambulatory Visit: Payer: Self-pay | Admitting: Hematology and Oncology

## 2016-12-20 ENCOUNTER — Encounter: Payer: Self-pay | Admitting: Gynecologic Oncology

## 2016-12-20 ENCOUNTER — Ambulatory Visit
Admission: RE | Admit: 2016-12-20 | Discharge: 2016-12-20 | Disposition: A | Discharge status: Home / Self Care | Payer: Medicaid Other | Source: Ambulatory Visit | Attending: Radiation Oncology | Admitting: Radiation Oncology

## 2016-12-20 ENCOUNTER — Other Ambulatory Visit (HOSPITAL_BASED_OUTPATIENT_CLINIC_OR_DEPARTMENT_OTHER): Payer: Medicaid Other

## 2016-12-20 DIAGNOSIS — Z510 Encounter for antineoplastic radiation therapy: Secondary | ICD-10-CM | POA: Diagnosis not present

## 2016-12-20 DIAGNOSIS — C530 Malignant neoplasm of endocervix: Secondary | ICD-10-CM

## 2016-12-20 DIAGNOSIS — C539 Malignant neoplasm of cervix uteri, unspecified: Secondary | ICD-10-CM

## 2016-12-20 DIAGNOSIS — C801 Malignant (primary) neoplasm, unspecified: Secondary | ICD-10-CM

## 2016-12-20 LAB — CBC WITH DIFFERENTIAL/PLATELET
BASO%: 0.6 % (ref 0.0–2.0)
Basophils Absolute: 0 10*3/uL (ref 0.0–0.1)
EOS%: 5 % (ref 0.0–7.0)
Eosinophils Absolute: 0.1 10*3/uL (ref 0.0–0.5)
HCT: 31.8 % — ABNORMAL LOW (ref 34.8–46.6)
HEMOGLOBIN: 10.8 g/dL — AB (ref 11.6–15.9)
LYMPH%: 31.3 % (ref 14.0–49.7)
MCH: 29.2 pg (ref 25.1–34.0)
MCHC: 33.9 g/dL (ref 31.5–36.0)
MCV: 86.1 fL (ref 79.5–101.0)
MONO#: 0.2 10*3/uL (ref 0.1–0.9)
MONO%: 15.4 % — ABNORMAL HIGH (ref 0.0–14.0)
NEUT%: 47.7 % (ref 38.4–76.8)
NEUTROS ABS: 0.5 10*3/uL — AB (ref 1.5–6.5)
PLATELETS: 150 10*3/uL (ref 145–400)
RBC: 3.69 10*6/uL — AB (ref 3.70–5.45)
RDW: 14.6 % — AB (ref 11.2–14.5)
WBC: 1 10*3/uL — AB (ref 3.9–10.3)
lymph#: 0.3 10*3/uL — ABNORMAL LOW (ref 0.9–3.3)

## 2016-12-20 LAB — COMPREHENSIVE METABOLIC PANEL
ALT: 19 U/L (ref 0–55)
AST: 9 U/L (ref 5–34)
Albumin: 3.5 g/dL (ref 3.5–5.0)
Alkaline Phosphatase: 54 U/L (ref 40–150)
Anion Gap: 8 mEq/L (ref 3–11)
BUN: 9.2 mg/dL (ref 7.0–26.0)
CO2: 27 meq/L (ref 22–29)
CREATININE: 0.7 mg/dL (ref 0.6–1.1)
Calcium: 9.6 mg/dL (ref 8.4–10.4)
Chloride: 103 mEq/L (ref 98–109)
EGFR: >90 mL/min/{1.73_m2} (ref >90)
GLUCOSE: 104 mg/dL (ref 70–140)
Potassium: 3.3 mEq/L — ABNORMAL LOW (ref 3.5–5.1)
SODIUM: 138 meq/L (ref 136–145)
TOTAL PROTEIN: 7 g/dL (ref 6.4–8.3)
Total Bilirubin: <0.22 mg/dL (ref 0.20–1.20)

## 2016-12-20 LAB — MAGNESIUM: Magnesium: 2 mg/dl (ref 1.5–2.5)

## 2016-12-20 MED ORDER — SILVER SULFADIAZINE 1 % EX CREA
TOPICAL_CREAM | Freq: Two times a day (BID) | CUTANEOUS | Status: DC
  Administered 2016-12-20: 17:00:00 via TOPICAL

## 2016-12-20 NOTE — Progress Notes (Signed)
Gynecologic Oncology Multi-Disciplinary Disposition Conference Note  Date of the Conference: December 20, 2016  Patient Name: Joanne Griffin  Referring Provider: Dr. Ilda Basset Primary GYN Oncologist: Dr. [name]  Stage/Disposition:  Stage IV adenocarcinoma of the cervix. Disposition is to primary chemoradiation.  Recommend repeat PET scan 12 weeks after completion of therapy.  Follow up with GYN Oncology 6 weeks after completion of therapy to evaluate response.   This Multidisciplinary conference took place involving physicians from [place], [place], Radiation Oncology, Pathology, Radiology along with the Gynecologic Oncology Nurse Practitioner and RN.  Comprehensive assessment of the patient's malignancy, staging, need for surgery, chemotherapy, radiation therapy, and need for further testing were reviewed. Supportive measures, both inpatient and following discharge were also discussed. The recommended plan of care is documented. Greater than 35 minutes were spent correlating and coordinating this patient's care.

## 2016-12-21 ENCOUNTER — Ambulatory Visit
Admission: RE | Admit: 2016-12-21 | Discharge: 2016-12-21 | Disposition: A | Discharge status: Home / Self Care | Payer: Medicaid Other | Source: Ambulatory Visit | Attending: Radiation Oncology | Admitting: Radiation Oncology

## 2016-12-21 ENCOUNTER — Telehealth: Payer: Self-pay | Admitting: Hematology and Oncology

## 2016-12-21 ENCOUNTER — Ambulatory Visit: Payer: Medicaid Other

## 2016-12-21 ENCOUNTER — Encounter: Payer: Self-pay | Admitting: Hematology and Oncology

## 2016-12-21 ENCOUNTER — Ambulatory Visit (HOSPITAL_BASED_OUTPATIENT_CLINIC_OR_DEPARTMENT_OTHER): Payer: Medicaid Other | Admitting: Hematology and Oncology

## 2016-12-21 DIAGNOSIS — D701 Agranulocytosis secondary to cancer chemotherapy: Secondary | ICD-10-CM

## 2016-12-21 DIAGNOSIS — G893 Neoplasm related pain (acute) (chronic): Secondary | ICD-10-CM

## 2016-12-21 DIAGNOSIS — Z510 Encounter for antineoplastic radiation therapy: Secondary | ICD-10-CM | POA: Diagnosis not present

## 2016-12-21 DIAGNOSIS — C530 Malignant neoplasm of endocervix: Secondary | ICD-10-CM | POA: Diagnosis present

## 2016-12-21 DIAGNOSIS — T451X5A Adverse effect of antineoplastic and immunosuppressive drugs, initial encounter: Secondary | ICD-10-CM

## 2016-12-21 DIAGNOSIS — C539 Malignant neoplasm of cervix uteri, unspecified: Secondary | ICD-10-CM

## 2016-12-21 DIAGNOSIS — Z720 Tobacco use: Secondary | ICD-10-CM

## 2016-12-21 MED ORDER — LEVOFLOXACIN 500 MG PO TABS: 500.0000 mg | ORAL_TABLET | Freq: Every day | ORAL | 0 refills | Status: DC

## 2016-12-21 MED ORDER — MORPHINE SULFATE 15 MG PO TABS: 15.0000 mg | ORAL_TABLET | Freq: Four times a day (QID) | ORAL | 0 refills | Status: DC | PRN

## 2016-12-21 NOTE — Progress Notes (Signed)
[place] OFFICE PROGRESS NOTE  Patient Care Team: Patient, No Pcp Per as PCP - General (General Practice)  SUMMARY OF ONCOLOGIC HISTORY:   Adenocarcinoma (GYN origin)   09/01/2016 Initial Diagnosis    Adenocarcinoma (GYN origin)      Cervical cancer ([place])   08/26/2016 Pathology Results    Adequacy Reason Satisfactory for evaluation, endocervical/transformation zone component PRESENT. Diagnosis ATYPICAL GLANDULAR CELLS. SEE COMMENT. COMMENT: THERE ARE GROUPS OF ATYPICAL GLANDULAR CELLS WHICH APPEAR TO BE OF ENDOCERVICAL ORIGIN. TISSUE STUDIES, SUCH AS A CURETTAGE, MAY HELP BETTER EVALUATE THE EXTENT AND SEVERITY OF THESE ATYPICAL CELLS.      09/01/2016 Initial Diagnosis    The patient reports she is not had Pap smears in the "many years". She presented initially with flank pain to the emergency room on 07/29/2016. She was treated for a UTI and had resolution of her flank pain although she continued to have suprapubic pain. She has had irregular bleeding and a copious discharge for several months. 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 Microscopic Comment Immunohistochemistry is attempted to determine endocervical vs. endometrial origin. CEA is focally positive, p16 is diffusely strongly positive, ER is weakly positive, and vimentin is positive. This profile is not specific as to origin and clinical correlation is recommended. Dr. Avis Epley has reviewed the case. The case was called to Dr. Ilda Basset on 09/06/2016      10/05/2016 Imaging    Ct abdomen: 4.7 cm heterogeneous enhancing mass in the cervix and lower uterine segment, consistent with known cervical carcinoma. Mild retroperitoneal lymphadenopathy in the inferior aortocaval space and right common iliac chain, suspicious for  metastatic disease.      10/29/2016 Procedure    Ultrasound and fluoroscopically guided right internal jugular single lumen power port catheter insertion. Tip in the SVC/RA junction. Catheter ready for use      11/09/2016 -  Radiation Therapy    She receives concurrent radiation treatment      11/09/2016 - 12/08/2016 Chemotherapy    She receives weekly cisplatin       INTERVAL HISTORY: Please see below for problem oriented charting. She returns for further follow-up She denies significant nausea, vomiting or constipation She complain of diarrhea which she was told could be due to side effects of radiation Her skin changes on the neck has improved She denies recent fever, chills or cough.  REVIEW OF SYSTEMS:   Constitutional: Denies fevers, chills or abnormal weight loss Eyes: Denies blurriness of vision Ears, nose, mouth, throat, and face: Denies mucositis or sore throat Respiratory: Denies cough, dyspnea or wheezes Cardiovascular: Denies palpitation, chest discomfort or lower extremity swelling Lymphatics: Denies new lymphadenopathy or easy bruising Neurological:Denies numbness, tingling or new weaknesses Behavioral/Psych: Mood is stable, no new changes  All other systems were reviewed with the patient and are negative.  I have reviewed the past medical history, past surgical history, social history and family history with the patient and they are unchanged from previous note.  ALLERGIES:  is allergic to amoxicillin and penicillins.  MEDICATIONS:  Current Outpatient Prescriptions  Medication Sig Dispense Refill  . buPROPion (WELLBUTRIN) 100 MG tablet Take 1 tablet (100 mg total) by mouth 2 (two) times daily. 60 tablet 1  . hyaluronate sodium (RADIAPLEXRX) GEL Apply 1 application topically 2 (two) times daily.    Marland Kitchen  levofloxacin (LEVAQUIN) 500 MG tablet Take 1 tablet (500 mg total) by mouth daily. 10 tablet 0  . LORazepam (ATIVAN) 0.5 MG tablet Take 1 tablet (0.5 mg total) by  mouth every 8 (eight) hours. As Needed for anxiety or nausea 60 tablet 0  . morphine (MSIR) 15 MG tablet Take 1 tablet (15 mg total) by mouth every 6 (six) hours as needed for severe pain. 60 tablet 0  . ondansetron (ZOFRAN) 8 MG tablet Take 1 tablet (8 mg total) by mouth every 8 (eight) hours as needed. 60 tablet 3  . prochlorperazine (COMPAZINE) 10 MG tablet Take 1 tablet (10 mg total) by mouth every 6 (six) hours as needed (Nausea or vomiting). 60 tablet 3  . silver sulfADIAZINE (SILVADENE) 1 % cream Apply 1 application topically daily.     No current facility-administered medications for this visit.     PHYSICAL EXAMINATION: ECOG PERFORMANCE STATUS: 1 - Symptomatic but completely ambulatory  Vitals:   12/21/16 1151  BP: 122/77  Pulse: (!) 106  Resp: 18  Temp: 98.3 F (36.8 C)   Filed Weights   12/21/16 1151  Weight: 141 lb 4.8 oz (64.1 kg)    GENERAL:alert, no distress and comfortable SKIN: Skin rashes at the base of the neck is healing without signs of ulceration EYES: normal, Conjunctiva are pink and non-injected, sclera clear OROPHARYNX:no exudate, no erythema and lips, buccal mucosa, and tongue normal  NECK: supple, thyroid normal size, non-tender, without nodularity LYMPH:  no palpable lymphadenopathy in the cervical, axillary or inguinal LUNGS: clear to auscultation and percussion with normal breathing effort HEART: regular rate & rhythm and no murmurs and no lower extremity edema ABDOMEN:abdomen soft, non-tender and normal bowel sounds Musculoskeletal:no cyanosis of digits and no clubbing  NEURO: alert & oriented x 3 with fluent speech, no focal motor/sensory deficits  LABORATORY DATA:  I have reviewed the data as listed    Component Value Date/Time   NA 138 12/20/2016 1411   K 3.3 (L) 12/20/2016 1411   CL 103 10/29/2016 1322   CO2 27 12/20/2016 1411   GLUCOSE 104 12/20/2016 1411   BUN 9.2 12/20/2016 1411   CREATININE 0.7 12/20/2016 1411   CALCIUM 9.6  12/20/2016 1411   PROT 7.0 12/20/2016 1411   ALBUMIN 3.5 12/20/2016 1411   AST 9 12/20/2016 1411   ALT 19 12/20/2016 1411   ALKPHOS 54 12/20/2016 1411   BILITOT <0.22 12/20/2016 1411   GFRNONAA >60 10/29/2016 1322   GFRAA >60 10/29/2016 1322    No results found for: SPEP, UPEP  Lab Results  Component Value Date   WBC 1.0 (L) 12/20/2016   NEUTROABS 0.5 (LL) 12/20/2016   HGB 10.8 (L) 12/20/2016   HCT 31.8 (L) 12/20/2016   MCV 86.1 12/20/2016   PLT 150 12/20/2016      Chemistry      Component Value Date/Time   NA 138 12/20/2016 1411   K 3.3 (L) 12/20/2016 1411   CL 103 10/29/2016 1322   CO2 27 12/20/2016 1411   BUN 9.2 12/20/2016 1411   CREATININE 0.7 12/20/2016 1411      Component Value Date/Time   CALCIUM 9.6 12/20/2016 1411   ALKPHOS 54 12/20/2016 1411   AST 9 12/20/2016 1411   ALT 19 12/20/2016 1411   BILITOT <0.22 12/20/2016 1411       ASSESSMENT & PLAN:  Cervical cancer ([facility]) She developed significant nausea and vomiting and pancytopenia with recent chemotherapy She has completed chemotherapy, and is still  undergoing radiation treatment  I will continue to provide supportive care.  Leukopenia due to antineoplastic chemotherapy [facility]) This is likely due to recent treatment.  She is not symptomatic.  I discussed with her neutropenic precaution I gave her prescription levofloxacin to hang onto in case she developed fever at home  Cancer associated pain Her pelvic pain is well controlled with current prescription morphine sulfate. I refill her prescription today. We discussed narcotic refill policy  Tobacco abuse She continues to smoke intermittently. We discussed the importance of tobacco cessation She appears to be motivated to quit on her own   No orders of the defined types were placed in this encounter.  All questions were answered. The patient knows to call the clinic with any problems, questions or concerns. No barriers to learning was  detected. I spent 15 minutes counseling the patient face to face. The total time spent in the appointment was 20 minutes and more than 50% was on counseling and review of test results     Heath Lark, MD 12/21/2016 12:42 PM

## 2016-12-21 NOTE — Assessment & Plan Note (Signed)
She developed significant nausea and vomiting and pancytopenia with recent chemotherapy She has completed chemotherapy, and is still undergoing radiation treatment  I will continue to provide supportive care.

## 2016-12-21 NOTE — Assessment & Plan Note (Signed)
This is likely due to recent treatment.  She is not symptomatic.  I discussed with her neutropenic precaution I gave her prescription levofloxacin to hang onto in case she developed fever at home

## 2016-12-21 NOTE — Assessment & Plan Note (Signed)
Her pelvic pain is well controlled with current prescription morphine sulfate. I refill her prescription today. We discussed narcotic refill policy

## 2016-12-21 NOTE — Telephone Encounter (Signed)
Scheduled appt per 6/26 los - Gave patient AVS and calender per los.  

## 2016-12-21 NOTE — Assessment & Plan Note (Signed)
She continues to smoke intermittently. We discussed the importance of tobacco cessation She appears to be motivated to quit on her own

## 2016-12-22 ENCOUNTER — Ambulatory Visit
Admission: RE | Admit: 2016-12-22 | Discharge: 2016-12-22 | Disposition: A | Discharge status: Home / Self Care | Payer: Medicaid Other | Source: Ambulatory Visit | Attending: Radiation Oncology | Admitting: Radiation Oncology

## 2016-12-22 ENCOUNTER — Ambulatory Visit: Payer: Medicaid Other

## 2016-12-22 DIAGNOSIS — Z510 Encounter for antineoplastic radiation therapy: Secondary | ICD-10-CM | POA: Diagnosis not present

## 2016-12-23 ENCOUNTER — Ambulatory Visit
Admission: RE | Admit: 2016-12-23 | Discharge: 2016-12-23 | Disposition: A | Discharge status: Home / Self Care | Payer: Medicaid Other | Source: Ambulatory Visit | Attending: Radiation Oncology | Admitting: Radiation Oncology

## 2016-12-23 ENCOUNTER — Encounter (HOSPITAL_BASED_OUTPATIENT_CLINIC_OR_DEPARTMENT_OTHER): Payer: Self-pay | Admitting: *Deleted

## 2016-12-23 ENCOUNTER — Ambulatory Visit: Payer: Medicaid Other

## 2016-12-23 DIAGNOSIS — Z510 Encounter for antineoplastic radiation therapy: Secondary | ICD-10-CM | POA: Diagnosis not present

## 2016-12-24 ENCOUNTER — Ambulatory Visit: Payer: Medicaid Other

## 2016-12-24 ENCOUNTER — Encounter: Payer: Self-pay | Admitting: General Practice

## 2016-12-24 NOTE — Progress Notes (Signed)
[place] Spiritual Care Note  Mailing handwritten note of care and encouragement because [name] really valued the first one, taking comfort in being remembered and feeling supported.   [place], [place], [facility] Pager [phone] Voicemail [phone]

## 2016-12-27 ENCOUNTER — Ambulatory Visit: Payer: Medicaid Other

## 2016-12-27 ENCOUNTER — Other Ambulatory Visit: Payer: Self-pay | Admitting: Radiation Oncology

## 2016-12-27 ENCOUNTER — Other Ambulatory Visit: Payer: Self-pay | Admitting: *Deleted

## 2016-12-27 DIAGNOSIS — C539 Malignant neoplasm of cervix uteri, unspecified: Secondary | ICD-10-CM

## 2016-12-27 MED ORDER — BUPROPION HCL 100 MG PO TABS: 100.0000 mg | ORAL_TABLET | Freq: Two times a day (BID) | ORAL | 1 refills | Status: DC

## 2016-12-28 ENCOUNTER — Encounter (HOSPITAL_BASED_OUTPATIENT_CLINIC_OR_DEPARTMENT_OTHER): Payer: Self-pay | Admitting: *Deleted

## 2016-12-28 ENCOUNTER — Ambulatory Visit: Payer: Medicaid Other

## 2016-12-28 NOTE — Progress Notes (Signed)
NPO AFTER MN.  ARRIVE AT 0600.  GETTING LAB WORK DONE Thursday 12-30-2016 (CBCdiff, BMET).

## 2016-12-30 ENCOUNTER — Encounter: Payer: Self-pay | Admitting: *Deleted

## 2016-12-30 DIAGNOSIS — D701 Agranulocytosis secondary to cancer chemotherapy: Secondary | ICD-10-CM | POA: Diagnosis not present

## 2016-12-30 DIAGNOSIS — F419 Anxiety disorder, unspecified: Secondary | ICD-10-CM | POA: Diagnosis not present

## 2016-12-30 DIAGNOSIS — Z923 Personal history of irradiation: Secondary | ICD-10-CM | POA: Diagnosis not present

## 2016-12-30 DIAGNOSIS — C539 Malignant neoplasm of cervix uteri, unspecified: Secondary | ICD-10-CM | POA: Diagnosis present

## 2016-12-30 DIAGNOSIS — Z880 Allergy status to penicillin: Secondary | ICD-10-CM | POA: Diagnosis not present

## 2016-12-30 DIAGNOSIS — Z79899 Other long term (current) drug therapy: Secondary | ICD-10-CM | POA: Diagnosis not present

## 2016-12-30 DIAGNOSIS — F17210 Nicotine dependence, cigarettes, uncomplicated: Secondary | ICD-10-CM | POA: Diagnosis not present

## 2016-12-30 DIAGNOSIS — F329 Major depressive disorder, single episode, unspecified: Secondary | ICD-10-CM | POA: Diagnosis not present

## 2016-12-30 DIAGNOSIS — Z9221 Personal history of antineoplastic chemotherapy: Secondary | ICD-10-CM | POA: Diagnosis not present

## 2016-12-30 LAB — CBC WITH DIFFERENTIAL/PLATELET
BASOS ABS: 0 10*3/uL (ref 0.0–0.1)
Basophils Relative: 1 %
EOS ABS: 0 10*3/uL (ref 0.0–0.7)
Eosinophils Relative: 1 %
HEMATOCRIT: 29.1 % — AB (ref 36.0–46.0)
Hemoglobin: 10 g/dL — ABNORMAL LOW (ref 12.0–15.0)
LYMPHS ABS: 0.3 10*3/uL — AB (ref 0.7–4.0)
Lymphocytes Relative: 21 %
MCH: 30.1 pg (ref 26.0–34.0)
MCHC: 34.4 g/dL (ref 30.0–36.0)
MCV: 87.7 fL (ref 78.0–100.0)
MONO ABS: 0.2 10*3/uL (ref 0.1–1.0)
Monocytes Relative: 14 %
Neutro Abs: 0.8 10*3/uL — ABNORMAL LOW (ref 1.7–7.7)
Neutrophils Relative %: 63 %
PLATELETS: 276 10*3/uL (ref 150–400)
RBC: 3.32 MIL/uL — ABNORMAL LOW (ref 3.87–5.11)
RDW: 16.9 % — AB (ref 11.5–15.5)
WBC: 1.3 10*3/uL — CL (ref 4.0–10.5)

## 2016-12-30 LAB — BASIC METABOLIC PANEL
Anion gap: 5 (ref 5–15)
BUN: 13 mg/dL (ref 6–20)
CALCIUM: 8.8 mg/dL — AB (ref 8.9–10.3)
CO2: 25 mmol/L (ref 22–32)
CREATININE: 0.74 mg/dL (ref 0.44–1.00)
Chloride: 106 mmol/L (ref 101–111)
Glucose, Bld: 119 mg/dL — ABNORMAL HIGH (ref 65–99)
Potassium: 3.9 mmol/L (ref 3.5–5.1)
SODIUM: 136 mmol/L (ref 135–145)

## 2016-12-30 NOTE — Progress Notes (Signed)
Critical value WBC 1.3 call report from lab at 1410. Called to Kandy Garrison RN .

## 2016-12-30 NOTE — Progress Notes (Signed)
1420 Received call from Twanna Hy, RN at [facility] for WBC results of 1.3 from pre op visit today critical result information send to Dr. Sondra Come by blammo for review and any change in plan of care.

## 2017-01-03 ENCOUNTER — Telehealth: Payer: Self-pay | Admitting: Oncology

## 2017-01-03 ENCOUNTER — Ambulatory Visit
Admission: RE | Admit: 2017-01-03 | Discharge: 2017-01-03 | Disposition: A | Discharge status: Home / Self Care | Payer: Medicaid Other | Source: Ambulatory Visit | Attending: Radiation Oncology | Admitting: Radiation Oncology

## 2017-01-03 ENCOUNTER — Encounter: Payer: Self-pay | Admitting: Radiation Oncology

## 2017-01-03 DIAGNOSIS — C539 Malignant neoplasm of cervix uteri, unspecified: Secondary | ICD-10-CM

## 2017-01-03 DIAGNOSIS — Z510 Encounter for antineoplastic radiation therapy: Secondary | ICD-10-CM | POA: Diagnosis not present

## 2017-01-03 LAB — CBC WITH DIFFERENTIAL/PLATELET
BASO%: 0.7 % (ref 0.0–2.0)
BASOS ABS: 0 10*3/uL (ref 0.0–0.1)
EOS%: 0.8 % (ref 0.0–7.0)
Eosinophils Absolute: 0 10*3/uL (ref 0.0–0.5)
HCT: 32 % — ABNORMAL LOW (ref 34.8–46.6)
HGB: 10.9 g/dL — ABNORMAL LOW (ref 11.6–15.9)
LYMPH%: 20.2 % (ref 14.0–49.7)
MCH: 30.3 pg (ref 25.1–34.0)
MCHC: 33.9 g/dL (ref 31.5–36.0)
MCV: 89.2 fL (ref 79.5–101.0)
MONO#: 0.3 10*3/uL (ref 0.1–0.9)
MONO%: 13.6 % (ref 0.0–14.0)
NEUT#: 1.3 10*3/uL — ABNORMAL LOW (ref 1.5–6.5)
NEUT%: 64.7 % (ref 38.4–76.8)
Platelets: 336 10*3/uL (ref 145–400)
RBC: 3.59 10*6/uL — ABNORMAL LOW (ref 3.70–5.45)
RDW: 20.1 % — ABNORMAL HIGH (ref 11.2–14.5)
WBC: 2 10*3/uL — ABNORMAL LOW (ref 3.9–10.3)
lymph#: 0.4 10*3/uL — ABNORMAL LOW (ref 0.9–3.3)

## 2017-01-03 NOTE — Telephone Encounter (Signed)
Called patient to see if she would be able to come in for lab work today.  Joanne Griffin said she could come in at 2 pm.  Lab appointment scheduled for 2 pm today.

## 2017-01-03 NOTE — H&P (Signed)
Radiation Oncology         [phone]) [phone] ________________________________  History and physical examination  Name: Joanne Griffin MRN: [id number]  Date: 12/03/2016  DOB: Dec 13, 1985   DIAGNOSIS: 31 year-old with invasive adenocarcinoma of the cervix, clinical stage IB2 adenocarcinoma cervix with radiographically suspicious pelvic lymphadenopathy and left supraclavicular lymphadenopathy  HISTORY OF PRESENT ILLNESS: Joanne Griffin is a 31 y.o. female who presented to the ER on 07/29/16 complaining of flank pain. She was treated for a UTI and had resolution of the flank pain although, she continued to have suprapubic pain. She has had irregular bleeding and a copious discharge for several months. Ultimately she had a Pap smear showing atypical glandular cells and a cervical biopsy on 09/01/16 revealed invasive adenocarcinoma.   PET scan revealed intense uptake in the cervix as well as upper pelvic nodal areas. In addition PET scan showed a left supraclavicular lymph node which was also palpable. The patient has recently completed her external beam radiation therapy now be taken to the operating room for placement of a tandem/ring in preparation for high-dose rate radiation therapy.   PREVIOUS RADIATION THERAPY: Yes , pelvis received 45 gray. A boost to the abnormal pelvic lymphadenopathy to 54 gray in upper pelvis region. The left supraclavicular region received 54 gray  PAST MEDICAL HISTORY:  has a past medical history of Anxiety; Cervical cancer [facility]) (oncologist-  dr gorsuch/  dr Sondra Come); Depression; History of cancer chemotherapy (11-09-2016 to 12-08-2016); History of radiation therapy (11-09-2016  to 12-22-2016); and Leukopenia due to antineoplastic chemotherapy ([place]).    PAST SURGICAL HISTORY: Past Surgical History:  Procedure Laterality Date  . CYST REMOVAL HAND  age 86  . IR FLUORO GUIDE PORT INSERTION RIGHT  10/29/2016  . IR US GUIDE VASC ACCESS RIGHT  10/29/2016  . WISDOM TOOTH EXTRACTION     times 4    FAMILY HISTORY: family history includes Breast cancer in her paternal grandmother; Diabetes in her maternal grandmother.  SOCIAL HISTORY:  reports that she has been smoking Cigarettes.  She has a 3.00 pack-year smoking history. She has never used smokeless tobacco. She reports that she does not drink alcohol or use drugs.  ALLERGIES: Amoxicillin and Penicillins  MEDICATIONS:  No current facility-administered medications for this encounter.    Current Outpatient Prescriptions  Medication Sig Dispense Refill  . buPROPion (WELLBUTRIN) 100 MG tablet Take 1 tablet (100 mg total) by mouth 2 (two) times daily. 60 tablet 1  . hyaluronate sodium (RADIAPLEXRX) GEL Apply 1 application topically 2 (two) times daily as needed.     Marland Kitchen LORazepam (ATIVAN) 0.5 MG tablet Take 1 tablet (0.5 mg total) by mouth every 8 (eight) hours. As Needed for anxiety or nausea 60 tablet 0  . morphine (MSIR) 15 MG tablet Take 1 tablet (15 mg total) by mouth every 6 (six) hours as needed for severe pain. 60 tablet 0  . ondansetron (ZOFRAN) 8 MG tablet Take 1 tablet (8 mg total) by mouth every 8 (eight) hours as needed. 60 tablet 3  . prochlorperazine (COMPAZINE) 10 MG tablet Take 1 tablet (10 mg total) by mouth every 6 (six) hours as needed (Nausea or vomiting). 60 tablet 3  . silver sulfADIAZINE (SILVADENE) 1 % cream Apply 1 application topically as needed.     Marland Kitchen levofloxacin (LEVAQUIN) 500 MG tablet Take 1 tablet (500 mg total) by mouth daily. (Patient taking differently: Take 500 mg by mouth as directed. Per pt as directed if gets fever and if feel like  getting a cold) 10 tablet 0    REVIEW OF SYSTEMS:  A 15 point review of systems is documented in the electronic medical record. This was obtained by the nursing staff. However, I reviewed this with the patient to discuss relevant findings and make appropriate changes.     PHYSICAL EXAM:   height is 5\' 2"  (1.575 m) and weight is 147 lb (66.7 kg). Her blood  pressure is 116/78 and her pulse is 92. Her oxygen saturation is 100%.   General: Alert and oriented, in no acute distress, accompanied by aunt and mother on evaluation today HEENT: Head is normocephalic. Extraocular movements are intact. Oropharynx is clear. Neck: Neck is supple. 1.5 cm lymph node in the left posterior triangle of the neck, mildly tender with palpation. Heart: Regular in rate and rhythm with no murmurs, rubs, or gallops. Chest: Clear to auscultation bilaterally, with no rhonchi, wheezes, or rales.  Abdomen: Soft, nontender, nondistended, with no rigidity or guarding. Extremities: No cyanosis or edema. Lymphatics: see Neck Exam Skin: No concerning lesions. Musculoskeletal: symmetric strength and muscle tone throughout. Neurologic: Cranial nerves II through XII are grossly intact. No obvious focalities. Speech is fluent. Coordination is intact. Psychiatric: Judgment and insight are intact. Affect is appropriate. EGBUS: Normal female Vagina: Normal, no lesions Cervix: Cervix is replaced by an exophytic nodular lesion which is slightly friable measuring approximately 4 cm in diameter. The lesion bleeds easily with exam. Bi-manual examination:    No obvious parametrial involvement  Rectal: normal sphincter tone, no masses, no blood, confirms bimanual exam    LABORATORY DATA:  Lab Results  Component Value Date   WBC 2.0 (L) 01/03/2017   HGB 10.9 (L) 01/03/2017   HCT 32.0 (L) 01/03/2017   MCV 89.2 01/03/2017   PLT 336 01/03/2017   NEUTROABS 1.3 (L) 01/03/2017   Lab Results  Component Value Date   NA 136 12/30/2016   K 3.9 12/30/2016   CL 106 12/30/2016   CO2 25 12/30/2016   GLUCOSE 119 (H) 12/30/2016   CREATININE 0.74 12/30/2016   CALCIUM 8.8 (L) 12/30/2016      RADIOGRAPHY: No results found.    IMPRESSION: Stage IV adenocarcinoma of the cervix by virtue of solitary left supraclavicular lymph node. The patient is being treated with curative intent has completed  external beam and radiosensitizing chemotherapy. She is now ready to proceed with brachytherapy directed at the cervical region. She will likely proceed with full dose chemotherapy after his she has completed her brachytherapy.  PLAN: Patient will be taken to the operating room on July 10 for her first high-dose-rate treatment. The patient will receive a total of 5 high-dose rate treatments using the tandem ring system.  Iridium 192 will be the high-dose-rate source.     ------------------------------------------------  Blair Promise, PhD, MD

## 2017-01-03 NOTE — Telephone Encounter (Signed)
Called patient and advised her of improved blood counts and that it is ok to proceed with the tandem and ring procedure tomorrow per Dr. Sondra Come.  [name] verbalized understanding and agreement.

## 2017-01-04 ENCOUNTER — Ambulatory Visit (HOSPITAL_BASED_OUTPATIENT_CLINIC_OR_DEPARTMENT_OTHER): Payer: Medicaid Other | Admitting: Anesthesiology

## 2017-01-04 ENCOUNTER — Ambulatory Visit
Admission: RE | Admit: 2017-01-04 | Discharge: 2017-01-04 | Disposition: A | Discharge status: Home / Self Care | Payer: Medicaid Other | Source: Ambulatory Visit | Attending: Radiation Oncology | Admitting: Radiation Oncology

## 2017-01-04 ENCOUNTER — Ambulatory Visit (HOSPITAL_BASED_OUTPATIENT_CLINIC_OR_DEPARTMENT_OTHER)
Admission: RE | Admit: 2017-01-04 | Discharge: 2017-01-04 | Disposition: A | Discharge status: Still In Hospital | Payer: Medicaid Other | Source: Ambulatory Visit | Attending: Radiation Oncology | Admitting: Radiation Oncology

## 2017-01-04 ENCOUNTER — Encounter (HOSPITAL_BASED_OUTPATIENT_CLINIC_OR_DEPARTMENT_OTHER)
Admission: RE | Disposition: A | Discharge status: Still In Hospital | Payer: Self-pay | Source: Ambulatory Visit | Attending: Radiation Oncology

## 2017-01-04 ENCOUNTER — Ambulatory Visit (HOSPITAL_COMMUNITY)
Admission: RE | Admit: 2017-01-04 | Discharge: 2017-01-04 | Disposition: A | Discharge status: Home / Self Care | Payer: Medicaid Other | Source: Ambulatory Visit | Attending: Radiation Oncology | Admitting: Radiation Oncology

## 2017-01-04 ENCOUNTER — Encounter (HOSPITAL_BASED_OUTPATIENT_CLINIC_OR_DEPARTMENT_OTHER): Payer: Self-pay | Admitting: *Deleted

## 2017-01-04 VITALS — BP 125/82 | HR 67

## 2017-01-04 DIAGNOSIS — C539 Malignant neoplasm of cervix uteri, unspecified: Secondary | ICD-10-CM

## 2017-01-04 DIAGNOSIS — F329 Major depressive disorder, single episode, unspecified: Secondary | ICD-10-CM | POA: Insufficient documentation

## 2017-01-04 DIAGNOSIS — Z923 Personal history of irradiation: Secondary | ICD-10-CM | POA: Insufficient documentation

## 2017-01-04 DIAGNOSIS — F17210 Nicotine dependence, cigarettes, uncomplicated: Secondary | ICD-10-CM | POA: Insufficient documentation

## 2017-01-04 DIAGNOSIS — Z9221 Personal history of antineoplastic chemotherapy: Secondary | ICD-10-CM | POA: Insufficient documentation

## 2017-01-04 DIAGNOSIS — D701 Agranulocytosis secondary to cancer chemotherapy: Secondary | ICD-10-CM | POA: Diagnosis not present

## 2017-01-04 DIAGNOSIS — F419 Anxiety disorder, unspecified: Secondary | ICD-10-CM | POA: Diagnosis not present

## 2017-01-04 DIAGNOSIS — Z880 Allergy status to penicillin: Secondary | ICD-10-CM | POA: Insufficient documentation

## 2017-01-04 DIAGNOSIS — Z79899 Other long term (current) drug therapy: Secondary | ICD-10-CM | POA: Insufficient documentation

## 2017-01-04 HISTORY — DX: Personal history of antineoplastic chemotherapy: Z92.21

## 2017-01-04 HISTORY — DX: Personal history of irradiation: Z92.3

## 2017-01-04 HISTORY — DX: Agranulocytosis secondary to cancer chemotherapy: D70.1

## 2017-01-04 HISTORY — DX: Agranulocytosis secondary to cancer chemotherapy: T45.1X5A

## 2017-01-04 HISTORY — PX: TANDEM RING INSERTION: SHX6199

## 2017-01-04 SURGERY — INSERTION, UTERINE TANDEM AND RING OR CYLINDER, FOR BRACHYTHERAPY
Anesthesia: General | Site: Cervix

## 2017-01-04 MED ORDER — LIDOCAINE 2% (20 MG/ML) 5 ML SYRINGE
INTRAMUSCULAR | Status: DC | PRN
  Administered 2017-01-04: 80 mg via INTRAVENOUS

## 2017-01-04 MED ORDER — DEXAMETHASONE SODIUM PHOSPHATE 4 MG/ML IJ SOLN
INTRAMUSCULAR | Status: DC | PRN
  Administered 2017-01-04: 10 mg via INTRAVENOUS

## 2017-01-04 MED ORDER — MORPHINE SULFATE 15 MG PO TABS: 15.0000 mg | ORAL_TABLET | Freq: Four times a day (QID) | ORAL | 0 refills | Status: DC | PRN

## 2017-01-04 MED ORDER — SUGAMMADEX SODIUM 200 MG/2ML IV SOLN
INTRAVENOUS | Status: AC
  Filled 2017-01-04: qty 2

## 2017-01-04 MED ORDER — ARTIFICIAL TEARS OPHTHALMIC OINT
TOPICAL_OINTMENT | OPHTHALMIC | Status: AC
  Filled 2017-01-04: qty 3.5

## 2017-01-04 MED ORDER — LACTATED RINGERS IV SOLN
INTRAVENOUS | Status: DC
  Administered 2017-01-04 (×2): via INTRAVENOUS
  Filled 2017-01-04: qty 1000

## 2017-01-04 MED ORDER — MIDAZOLAM HCL 2 MG/2ML IJ SOLN
INTRAMUSCULAR | Status: AC
  Filled 2017-01-04: qty 2

## 2017-01-04 MED ORDER — OXYCODONE HCL 5 MG/5ML PO SOLN
5.0000 mg | Freq: Once | ORAL | Status: DC | PRN
  Filled 2017-01-04: qty 5

## 2017-01-04 MED ORDER — HYDROMORPHONE HCL 1 MG/ML IJ SOLN
0.5000 mg | Freq: Once | INTRAMUSCULAR | Status: AC
  Administered 2017-01-04: 0.5 mg via INTRAVENOUS
  Filled 2017-01-04: qty 1

## 2017-01-04 MED ORDER — DEXAMETHASONE SODIUM PHOSPHATE 10 MG/ML IJ SOLN
INTRAMUSCULAR | Status: AC
  Filled 2017-01-04: qty 1

## 2017-01-04 MED ORDER — HYDROMORPHONE HCL 1 MG/ML IJ SOLN
0.2500 mg | INTRAMUSCULAR | Status: DC | PRN
  Administered 2017-01-04: 0.5 mg via INTRAVENOUS
  Administered 2017-01-04 (×2): 0.25 mg via INTRAVENOUS
  Filled 2017-01-04: qty 0.5

## 2017-01-04 MED ORDER — ROCURONIUM BROMIDE 50 MG/5ML IV SOSY
PREFILLED_SYRINGE | INTRAVENOUS | Status: AC
  Filled 2017-01-04: qty 5

## 2017-01-04 MED ORDER — FENTANYL CITRATE (PF) 100 MCG/2ML IJ SOLN
INTRAMUSCULAR | Status: AC
  Filled 2017-01-04: qty 2

## 2017-01-04 MED ORDER — HYDROMORPHONE HCL-NACL 0.5-0.9 MG/ML-% IV SOSY
PREFILLED_SYRINGE | INTRAVENOUS | Status: AC
  Filled 2017-01-04: qty 1

## 2017-01-04 MED ORDER — LIDOCAINE 2% (20 MG/ML) 5 ML SYRINGE
INTRAMUSCULAR | Status: AC
  Filled 2017-01-04: qty 10

## 2017-01-04 MED ORDER — OXYCODONE HCL 5 MG PO TABS
5.0000 mg | ORAL_TABLET | Freq: Once | ORAL | Status: DC | PRN
  Filled 2017-01-04: qty 1

## 2017-01-04 MED ORDER — KETOROLAC TROMETHAMINE 30 MG/ML IJ SOLN
INTRAMUSCULAR | Status: DC | PRN
  Administered 2017-01-04: 30 mg via INTRAVENOUS

## 2017-01-04 MED ORDER — HYDROMORPHONE HCL 4 MG/ML IJ SOLN
1.0000 mg | Freq: Once | INTRAMUSCULAR | Status: AC
  Administered 2017-01-04: 1 mg via INTRAVENOUS
  Filled 2017-01-04: qty 1

## 2017-01-04 MED ORDER — ONDANSETRON HCL 4 MG/2ML IJ SOLN
INTRAMUSCULAR | Status: AC
  Filled 2017-01-04: qty 2

## 2017-01-04 MED ORDER — KETOROLAC TROMETHAMINE 30 MG/ML IJ SOLN
INTRAMUSCULAR | Status: AC
  Filled 2017-01-04: qty 1

## 2017-01-04 MED ORDER — DIPHENHYDRAMINE HCL 25 MG PO CAPS
25.0000 mg | ORAL_CAPSULE | Freq: Once | ORAL | Status: AC
  Administered 2017-01-04: 25 mg via ORAL
  Filled 2017-01-04: qty 1

## 2017-01-04 MED ORDER — HYDROMORPHONE HCL 1 MG/ML IJ SOLN
INTRAMUSCULAR | Status: AC
  Filled 2017-01-04: qty 1

## 2017-01-04 MED ORDER — PROPOFOL 10 MG/ML IV BOLUS
INTRAVENOUS | Status: DC | PRN
  Administered 2017-01-04: 150 mg via INTRAVENOUS
  Administered 2017-01-04: 50 mg via INTRAVENOUS

## 2017-01-04 MED ORDER — FENTANYL CITRATE (PF) 100 MCG/2ML IJ SOLN
INTRAMUSCULAR | Status: DC | PRN
  Administered 2017-01-04 (×3): 25 ug via INTRAVENOUS
  Administered 2017-01-04: 50 ug via INTRAVENOUS
  Administered 2017-01-04: 25 ug via INTRAVENOUS

## 2017-01-04 MED ORDER — ONDANSETRON HCL 4 MG/2ML IJ SOLN
INTRAMUSCULAR | Status: DC | PRN
  Administered 2017-01-04: 4 mg via INTRAVENOUS

## 2017-01-04 MED ORDER — MIDAZOLAM HCL 5 MG/5ML IJ SOLN
INTRAMUSCULAR | Status: DC | PRN
  Administered 2017-01-04: 2 mg via INTRAVENOUS
  Administered 2017-01-04 (×2): 1 mg via INTRAVENOUS

## 2017-01-04 SURGICAL SUPPLY — 38 items
BAG URINE DRAINAGE (UROLOGICAL SUPPLIES) ×3 IMPLANT
BNDG CONFORM 2 STRL LF (GAUZE/BANDAGES/DRESSINGS) IMPLANT
CATH FOLEY 2WAY SLVR  5CC 16FR (CATHETERS) ×2
CATH FOLEY 2WAY SLVR 5CC 16FR (CATHETERS) ×1 IMPLANT
COVER BACK TABLE 60X90IN (DRAPES) ×3 IMPLANT
DILATOR CANAL MILEX (MISCELLANEOUS) ×2 IMPLANT
DRAPE LG THREE QUARTER DISP (DRAPES) ×3 IMPLANT
DRAPE UNDERBUTTOCKS STRL (DRAPE) ×3 IMPLANT
GLOVE BIO SURGEON STRL SZ7.5 (GLOVE) ×6 IMPLANT
GLOVE BIOGEL PI IND STRL 7.5 (GLOVE) IMPLANT
GLOVE BIOGEL PI INDICATOR 7.5 (GLOVE) ×8
GOWN STRL REUS W/ TWL LRG LVL3 (GOWN DISPOSABLE) ×2 IMPLANT
GOWN STRL REUS W/TWL LRG LVL3 (GOWN DISPOSABLE) ×6
HOLDER FOLEY CATH W/STRAP (MISCELLANEOUS) ×3 IMPLANT
KIT RM TURNOVER CYSTO AR (KITS) ×3 IMPLANT
LEGGING LITHOTOMY PAIR STRL (DRAPES) ×3 IMPLANT
MANIFOLD NEPTUNE II (INSTRUMENTS) IMPLANT
NDL SPNL 22GX3.5 QUINCKE BK (NEEDLE) IMPLANT
NEEDLE SPNL 22GX3.5 QUINCKE BK (NEEDLE) IMPLANT
PACK BASIN DAY SURGERY FS (CUSTOM PROCEDURE TRAY) ×3 IMPLANT
PACKING VAGINAL (PACKING) IMPLANT
PAD ABD 8X10 STRL (GAUZE/BANDAGES/DRESSINGS) ×3 IMPLANT
PAD OB MATERNITY 4.3X12.25 (PERSONAL CARE ITEMS) IMPLANT
PAD PREP 24X48 CUFFED NSTRL (MISCELLANEOUS) ×3 IMPLANT
PLUG CATH AND CAP STER (CATHETERS) IMPLANT
SET IRRIG Y TYPE TUR BLADDER L (SET/KITS/TRAYS/PACK) ×3 IMPLANT
SUT PROLENE 0 SH 30 (SUTURE) IMPLANT
SUT SILK 2 0 30  PSL (SUTURE)
SUT SILK 2 0 30 PSL (SUTURE) IMPLANT
SYR BULB IRRIGATION 50ML (SYRINGE) IMPLANT
SYR CONTROL 10ML LL (SYRINGE) IMPLANT
SYRINGE 10CC LL (SYRINGE) ×3 IMPLANT
TOWEL OR 17X24 6PK STRL BLUE (TOWEL DISPOSABLE) ×6 IMPLANT
TRAY DSU PREP LF (CUSTOM PROCEDURE TRAY) ×3 IMPLANT
TUBE CONNECTING 12'X1/4 (SUCTIONS) ×1
TUBE CONNECTING 12X1/4 (SUCTIONS) ×1 IMPLANT
WATER STERILE IRR 3000ML UROMA (IV SOLUTION) ×3 IMPLANT
WATER STERILE IRR 500ML POUR (IV SOLUTION) ×3 IMPLANT

## 2017-01-04 NOTE — Progress Notes (Signed)
Foley catheter removed intact.  Assisted Dr. Sondra Come with tandem removal. Removed left hand IV intact.  Gave patient discharge instructions and paperwork.  Transported her to the lobby via wheelchair with her family.

## 2017-01-04 NOTE — Anesthesia Procedure Notes (Signed)
Procedure Name: LMA Insertion Date/Time: 01/04/2017 7:37 AM Performed by: Wanita Chamberlain Pre-anesthesia Checklist: Patient identified, Emergency Drugs available, Suction available, Patient being monitored and Timeout performed Patient Re-evaluated:Patient Re-evaluated prior to inductionOxygen Delivery Method: Circle system utilized Preoxygenation: Pre-oxygenation with 100% oxygen Intubation Type: IV induction Ventilation: Mask ventilation without difficulty LMA: LMA inserted LMA Size: 4.0 Number of attempts: 1 Placement Confirmation: positive ETCO2 and breath sounds checked- equal and bilateral Tube secured with: Tape Dental Injury: Teeth and Oropharynx as per pre-operative assessment

## 2017-01-04 NOTE — Progress Notes (Signed)
Patient reports having pain in her vaginal area at a 8/10.  0.5 mg dilaudid IV given per Dr. Sondra Come.

## 2017-01-04 NOTE — Anesthesia Preprocedure Evaluation (Addendum)
Anesthesia Evaluation  Patient identified by MRN, date of birth, ID band Patient awake, Patient confused and Patient unresponsive    Reviewed: Allergy & Precautions, NPO status , Patient's Chart, lab work & pertinent test results  Airway Mallampati: I  TM Distance: >3 FB Neck ROM: Full    Dental  (+) Teeth Intact, Dental Advisory Given, Chipped,  Some dental decay noted:   Pulmonary Current Smoker,    breath sounds clear to auscultation       Cardiovascular  Rhythm:Regular Rate:Normal     Neuro/Psych    GI/Hepatic negative GI ROS, Neg liver ROS,   Endo/Other  negative endocrine ROS  Renal/GU negative Renal ROS   adenoca of cervix     Musculoskeletal   Abdominal   Peds  Hematology  (+) Blood dyscrasia, anemia ,   Anesthesia Other Findings   Reproductive/Obstetrics                           Anesthesia Physical Anesthesia Plan  ASA: II  Anesthesia Plan: General   Post-op Pain Management:    Induction: Intravenous  PONV Risk Score and Plan: 3 and Ondansetron, Dexamethasone, Propofol and Midazolam  Airway Management Planned: Oral ETT  Additional Equipment:   Intra-op Plan:   Post-operative Plan: Extubation in OR  Informed Consent: I have reviewed the patients History and Physical, chart, labs and discussed the procedure including the risks, benefits and alternatives for the proposed anesthesia with the patient or authorized representative who has indicated his/her understanding and acceptance.     Plan Discussed with:   Anesthesia Plan Comments:         Anesthesia Quick Evaluation

## 2017-01-04 NOTE — Op Note (Signed)
01/04/2017  9:00 AM  PATIENT:  Joanne Griffin  31 y.o. female  PRE-OPERATIVE DIAGNOSIS:  CERVICAL CANCER  POST-OPERATIVE DIAGNOSIS:  CERVICAL CANCER  PROCEDURE:  Procedure(s): TANDEM RING INSERTION (N/A)  SURGEON:  Surgeon(s) and Role:    * Gery Pray, MD - Primary  PHYSICIAN ASSISTANT:   ASSISTANTS: none   ANESTHESIA:   general  EBL:  Total I/O In: 700 [I.V.:700] Out: 325 [Urine:325]  BLOOD ADMINISTERED:none  DRAINS: Urinary Catheter (Foley)   LOCAL MEDICATIONS USED:  NONE  SPECIMEN:  No Specimen  DISPOSITION OF SPECIMEN:  N/A  COUNTS:  YES  TOURNIQUET:  * No tourniquets in log *  DICTATION: The patient was taken to the outpatient OR #3. A general anesthetic was applied. Timeout was performed for the length of the procedure, preoperative antibiotics (none) and allergies. The patient was prepped and draped in the usual sterile fashion and placed in the dorsolithotomy position. A minimal amount of Trendelenburg position was used during the procedure. The cervical canal was stenotic and did require placement of a os dilator initially. The uterus sounded to approximately 6 cm. The uterus was noted to be in moderate anteverted position. Patient then had placement of a 60 mm cervical sleeve. This was noted to be too long and extended outside the cervical area and into the vaginal vault. She then had placement of a 40 mm cervical sleeve without difficulty. Excellent images were obtained with ultrasound. Patient then had a 40 mm 45 tandem placed within the cervical canal and inside the endometrial cavity. Good placement was noted. Patient then had a 45 ring with a small shielding cap placed within the upper vaginal vault. This was then followed by placement of a rectal paddle posteriorly. The equipment was affixed to each other securely. There is no room for vaginal packing. Intraoperative ultrasound at completion of the procedure showed continued good placement of the tandem  within the uterine canal. Patient tolerated the procedure well. She will be transferred to radiation oncology for planning and her first high-dose-rate treatment. The patient is scheduled received 5 high-dose treatments with iridium 192 as the high-dose-rate source.  PLAN OF CARE: Transferred to radiation oncology for planning and treatment  PATIENT DISPOSITION:  PACU - hemodynamically stable.   Delay start of Pharmacological VTE agent (>24hrs) due to surgical blood loss or risk of bleeding: not applicable

## 2017-01-04 NOTE — Progress Notes (Signed)
  Radiation Oncology         [phone]) [phone] ________________________________  Name: Joanne Griffin MRN: [id number]  Date: 01/04/2017  DOB: Sep 10, 1985  CC: Patient, No Pcp Per  [name],*  HDR BRACHYTHERAPY NOTE  DIAGNOSIS: 31 year-old with invasive adenocarcinoma of the cervix, clinical stage IB2 adenocarcinoma cervix with radiographically suspicious pelvic lymphadenopathy and left supraclavicular lymphadenopathy   NARRATIVE: The patient was brought to the HDR suite. Identity was confirmed. All relevant records and images related to the planned course of therapy were reviewed. The patient freely provided informed written consent to proceed with treatment after reviewing the details related to the planned course of therapy. The consent form was witnessed and verified by the simulation staff. Then, the patient was set-up in a stable reproducible supine position for radiation therapy. The tandem ring system was accessed and fiducial markers were placed within the tandem and ring.   Simple treatment device note: On the operating room the patient had construction of her custom tandem ring system. She will be treated with a 45 tandem/ring system. The patient had placement of a 40 mm tandem. A cervical ring with a small shielding was used for her treatment. A rectal paddle was also part of her custom set up device.  Verification simulation note: An AP and lateral film was obtained through the pelvis area. This was compared to the patient's planning films documenting accurate position of the tandem/ring system for treatment.  High-dose-rate brachytherapy treatment note:  The remote afterloading device was accessed through catheter system and attached to the tandem ring system. Patient then proceeded to undergo her first high-dose-rate treatment directed at the cervix. The patient was prescribed a dose of 5.5 gray to be delivered to the mucosal surface.. Patient was treated with 2  channels using 19 dwell positions. Treatment time was 218.9 seconds. The patient tolerated the procedure well. After completion of her therapy, a radiation survey was performed documenting return of the iridium source into the [employer] safe. The patient was then transferred to the nursing suite. She then had removal of the rectal paddle followed by the tandem and ring system. The patient tolerated the removal well.  PLAN: Patient will return on July 16 th  for her second high-dose-rate treatment. ________________________________  Blair Promise, PhD, MD

## 2017-01-04 NOTE — Progress Notes (Signed)
  Radiation Oncology         [phone]) [phone] ________________________________  Name: Joanne Griffin MRN: [id number]  Date: 01/04/2017  DOB: May 13, 1986  SIMULATION AND TREATMENT PLANNING NOTE HDR BRACHYTHERAPY  DIAGNOSIS:  31 year-old with invasive adenocarcinoma of the cervix, clinical stage IB2 adenocarcinoma cervix with radiographically suspicious pelvic lymphadenopathy and left supraclavicular lymphadenopathy   NARRATIVE:  The patient was brought to the [facility].  Identity was confirmed.  All relevant records and images related to the planned course of therapy were reviewed.  The patient freely provided informed written consent to proceed with treatment after reviewing the details related to the planned course of therapy. The consent form was witnessed and verified by the simulation staff.  Then, the patient was set-up in a stable reproducible  supine position for radiation therapy.  CT images were obtained.  Surface markings were placed.  The CT images were loaded into the planning software.  Then the target and avoidance structures were contoured.  Treatment planning then occurred.  The radiation prescription was entered and confirmed.   I have requested : Brachytherapy Isodose Plan and Dosimetry Calculations to plan the radiation distribution.    PLAN:  The patient will receive 5.5 Gy in 1 fraction.  The patient will be treated with a tandem ring system using iridium 192 as the high-dose-rate source.    ________________________________  Blair Promise, PhD, MD

## 2017-01-04 NOTE — Progress Notes (Signed)
Patient back from CT SIM.  She reports her pain is now a 6/10.  IV converted to pump tubing.  LR now infusing at 125 ml/hr.  Call light in reach and family present in the room.

## 2017-01-04 NOTE — Progress Notes (Signed)
IMMEDIATELY FOLLOWING SURGERY: Do not drive or operate machinery for the first twenty four hours after surgery. Do not make any important decisions for twenty four hours after surgery or while taking narcotic pain medications or sedatives. If you develop intractable nausea and vomiting or a severe headache please notify your doctor immediately.   FOLLOW-UP: You do not need to follow up with anesthesia unless specifically instructed to do so.   WOUND CARE INSTRUCTIONS (if applicable): Expect some mild vaginal bleeding, but if large amount of bleeding occurs please contact Dr. Sondra Come at [phone] or the Radiation On-Call physician. Call for any fever greater than 101.0 degrees or increasing vaginal//abdominal pain or trouble urinating.   QUESTIONS?: Please feel free to call your physician or the hospital operator if you have any questions, and they will be happy to assist you. Resume all medications: as listed on your after visit summary. Your next appointment is:  Future Appointments Date Time Provider [place]  01/10/2017 6:30 AM [facility]US 1 [facility]-US [place]  01/10/2017 10:00 AM Gery Pray, MD [facility] None  01/10/2017 11:00 AM [facility] LAB 4 [facility] None  01/10/2017 11:30 AM Heath Lark, MD [facility] None  01/10/2017 2:00 PM Gery Pray, MD [facility] None  01/17/2017 6:30 AM [facility]-US 1 WL-US [place]  01/17/2017 10:00 AM Gery Pray, MD [facility] None  01/17/2017 2:00 PM Gery Pray, MD [facility] None  01/20/2017 6:30 AM WL-US 1 WL-US   01/20/2017 10:00 AM Gery Pray, MD [facility] None  01/20/2017 1:00 PM Gery Pray, MD [place] None  01/24/2017 6:00 AM WL-US 1 WL-US   01/24/2017 10:00 AM Gery Pray, MD [facility] None  01/24/2017 2:00 PM Gery Pray, MD [facility] None  02/25/2017 1:15 PM [name], MD [facility] None

## 2017-01-04 NOTE — Anesthesia Postprocedure Evaluation (Signed)
Anesthesia Post Note  Patient: Joanne Griffin  Procedure(s) Performed: Procedure(s) (LRB): TANDEM RING INSERTION (N/A)     Patient location during evaluation: PACU Anesthesia Type: General Level of consciousness: awake and alert Pain management: pain level controlled Vital Signs Assessment: post-procedure vital signs reviewed and stable Respiratory status: spontaneous breathing, nonlabored ventilation, respiratory function stable and patient connected to nasal cannula oxygen Cardiovascular status: blood pressure returned to baseline and stable Postop Assessment: no signs of nausea or vomiting Anesthetic complications: no    Last Vitals:  Vitals:   01/04/17 0915 01/04/17 0930  BP: (!) 138/93 131/90  Pulse: 80 70  Resp: 19 15  Temp:      Last Pain:  Vitals:   01/04/17 0930  TempSrc:   PainSc: 5                  [name],JAMES TERRILL

## 2017-01-04 NOTE — Progress Notes (Signed)
Received report from Ivin Booty, [place] in PACU.  Transported patient via stretcher to CT SIM.

## 2017-01-04 NOTE — Interval H&P Note (Signed)
History and Physical Interval Note:  01/04/2017 7:31 AM  Joanne Griffin  has presented today for surgery, with the diagnosis of CERVICAL CANCER  The various methods of treatment have been discussed with the patient and family. After consideration of risks, benefits and other options for treatment, the patient has consented to  Procedure(s): TANDEM RING INSERTION (N/A) as a surgical intervention .  The patient's history has been reviewed, patient examined, no change in status, stable for surgery.  I have reviewed the patient's chart and labs.  Questions were answered to the patient's satisfaction.     Gery Pray

## 2017-01-04 NOTE — Transfer of Care (Signed)
Immediate Anesthesia Transfer of Care Note  Patient: Joanne Griffin  Procedure(s) Performed: Procedure(s): TANDEM RING INSERTION (N/A)  Patient Location: PACU  Anesthesia Type:General  Level of Consciousness: awake, alert , oriented and patient cooperative  Airway & Oxygen Therapy: Patient Spontanous Breathing and Patient connected to nasal cannula oxygen  Post-op Assessment: Report given to RN and Post -op Vital signs reviewed and stable  Post vital signs: Reviewed and stable  Last Vitals:  Vitals:   01/04/17 0625 01/04/17 0828  BP: (!) 113/51   Pulse: 84   Resp: 16   Temp: (!) 36.4 C (P) 36.7 C    Last Pain:  Vitals:   01/04/17 0625  TempSrc: Oral      Patients Stated Pain Goal: 5 (29/02/11 1552)  Complications: No apparent anesthesia complications

## 2017-01-05 ENCOUNTER — Encounter (HOSPITAL_BASED_OUTPATIENT_CLINIC_OR_DEPARTMENT_OTHER): Payer: Self-pay | Admitting: Radiation Oncology

## 2017-01-06 ENCOUNTER — Encounter (HOSPITAL_BASED_OUTPATIENT_CLINIC_OR_DEPARTMENT_OTHER): Payer: Self-pay | Admitting: *Deleted

## 2017-01-06 NOTE — Progress Notes (Signed)
NPO AFTER MN.  ARRIVE AT 0600.  GETTING LAB WORK DONE Friday, 01-07-2017 (CBCdiff, BMET).

## 2017-01-07 ENCOUNTER — Encounter: Payer: Self-pay | Admitting: Radiation Oncology

## 2017-01-07 LAB — CBC WITH DIFFERENTIAL/PLATELET
Basophils Absolute: 0 10*3/uL (ref 0.0–0.1)
Basophils Relative: 0 %
EOS ABS: 0 10*3/uL (ref 0.0–0.7)
EOS PCT: 0 %
HCT: 33.1 % — ABNORMAL LOW (ref 36.0–46.0)
HEMOGLOBIN: 11.5 g/dL — AB (ref 12.0–15.0)
LYMPHS ABS: 0.6 10*3/uL — AB (ref 0.7–4.0)
LYMPHS PCT: 17 %
MCH: 30.1 pg (ref 26.0–34.0)
MCHC: 34.7 g/dL (ref 30.0–36.0)
MCV: 86.6 fL (ref 78.0–100.0)
MONOS PCT: 11 %
Monocytes Absolute: 0.4 10*3/uL (ref 0.1–1.0)
NEUTROS PCT: 72 %
Neutro Abs: 2.5 10*3/uL (ref 1.7–7.7)
Platelets: 357 10*3/uL (ref 150–400)
RBC: 3.82 MIL/uL — AB (ref 3.87–5.11)
RDW: 18.4 % — ABNORMAL HIGH (ref 11.5–15.5)
WBC: 3.5 10*3/uL — AB (ref 4.0–10.5)

## 2017-01-07 LAB — BASIC METABOLIC PANEL
Anion gap: 12 (ref 5–15)
BUN: 13 mg/dL (ref 6–20)
CO2: 23 mmol/L (ref 22–32)
CREATININE: 0.84 mg/dL (ref 0.44–1.00)
Calcium: 9.5 mg/dL (ref 8.9–10.3)
Chloride: 102 mmol/L (ref 101–111)
GFR calc Af Amer: >60 mL/min (ref >60)
GFR calc non Af Amer: >60 mL/min (ref >60)
GLUCOSE: 136 mg/dL — AB (ref 65–99)
Potassium: 3.6 mmol/L (ref 3.5–5.1)
Sodium: 137 mmol/L (ref 135–145)

## 2017-01-08 ENCOUNTER — Other Ambulatory Visit: Payer: Self-pay | Admitting: Hematology and Oncology

## 2017-01-08 DIAGNOSIS — C801 Malignant (primary) neoplasm, unspecified: Secondary | ICD-10-CM

## 2017-01-08 NOTE — Anesthesia Preprocedure Evaluation (Addendum)
Anesthesia Evaluation  Patient identified by MRN, date of birth, ID band Patient awake, Patient confused and Patient unresponsive    Reviewed: Allergy & Precautions, NPO status , Patient's Chart, lab work & pertinent test results  Airway Mallampati: I  TM Distance: >3 FB Neck ROM: Full    Dental  (+) Teeth Intact, Dental Advisory Given, Chipped,  Some dental decay noted:   Pulmonary Current Smoker,    breath sounds clear to auscultation       Cardiovascular  Rhythm:Regular Rate:Normal     Neuro/Psych    GI/Hepatic negative GI ROS, Neg liver ROS,   Endo/Other  negative endocrine ROS  Renal/GU negative Renal ROS   adenoca of cervix     Musculoskeletal   Abdominal   Peds  Hematology  (+) Blood dyscrasia, anemia ,   Anesthesia Other Findings Patient will receive a series of procedures for weekly cervical radiation  Reproductive/Obstetrics                            Anesthesia Physical  Anesthesia Plan  ASA: II  Anesthesia Plan: General   Post-op Pain Management:    Induction: Intravenous  PONV Risk Score and Plan: 3 and Ondansetron, Dexamethasone, Propofol and Midazolam  Airway Management Planned: LMA  Additional Equipment:   Intra-op Plan:   Post-operative Plan: Extubation in OR  Informed Consent: I have reviewed the patients History and Physical, chart, labs and discussed the procedure including the risks, benefits and alternatives for the proposed anesthesia with the patient or authorized representative who has indicated his/her understanding and acceptance.     Plan Discussed with:   Anesthesia Plan Comments:         Anesthesia Quick Evaluation

## 2017-01-10 ENCOUNTER — Ambulatory Visit (HOSPITAL_BASED_OUTPATIENT_CLINIC_OR_DEPARTMENT_OTHER): Payer: Medicaid Other | Admitting: Anesthesiology

## 2017-01-10 ENCOUNTER — Encounter (HOSPITAL_BASED_OUTPATIENT_CLINIC_OR_DEPARTMENT_OTHER)
Admission: RE | Disposition: A | Discharge status: Home / Self Care | Payer: Self-pay | Source: Ambulatory Visit | Attending: Radiation Oncology

## 2017-01-10 ENCOUNTER — Telehealth: Payer: Self-pay | Admitting: Hematology and Oncology

## 2017-01-10 ENCOUNTER — Other Ambulatory Visit: Payer: Medicaid Other

## 2017-01-10 ENCOUNTER — Ambulatory Visit (HOSPITAL_BASED_OUTPATIENT_CLINIC_OR_DEPARTMENT_OTHER)
Admission: RE | Admit: 2017-01-10 | Discharge: 2017-01-10 | Disposition: A | Discharge status: Home / Self Care | Payer: Medicaid Other | Source: Ambulatory Visit | Attending: Radiation Oncology | Admitting: Radiation Oncology

## 2017-01-10 ENCOUNTER — Ambulatory Visit: Payer: Medicaid Other | Admitting: Hematology and Oncology

## 2017-01-10 ENCOUNTER — Ambulatory Visit
Admission: RE | Admit: 2017-01-10 | Discharge: 2017-01-10 | Disposition: A | Discharge status: Home / Self Care | Payer: Medicaid Other | Source: Ambulatory Visit | Attending: Radiation Oncology | Admitting: Radiation Oncology

## 2017-01-10 ENCOUNTER — Encounter (HOSPITAL_BASED_OUTPATIENT_CLINIC_OR_DEPARTMENT_OTHER): Payer: Self-pay | Admitting: *Deleted

## 2017-01-10 ENCOUNTER — Telehealth: Payer: Self-pay | Admitting: Oncology

## 2017-01-10 ENCOUNTER — Ambulatory Visit (HOSPITAL_COMMUNITY)
Admission: RE | Admit: 2017-01-10 | Discharge: 2017-01-10 | Disposition: A | Discharge status: Home / Self Care | Payer: Medicaid Other | Source: Ambulatory Visit | Attending: Radiation Oncology | Admitting: Radiation Oncology

## 2017-01-10 VITALS — BP 115/66 | HR 74

## 2017-01-10 DIAGNOSIS — F329 Major depressive disorder, single episode, unspecified: Secondary | ICD-10-CM | POA: Insufficient documentation

## 2017-01-10 DIAGNOSIS — Z880 Allergy status to penicillin: Secondary | ICD-10-CM | POA: Diagnosis not present

## 2017-01-10 DIAGNOSIS — Z923 Personal history of irradiation: Secondary | ICD-10-CM | POA: Diagnosis not present

## 2017-01-10 DIAGNOSIS — F419 Anxiety disorder, unspecified: Secondary | ICD-10-CM | POA: Insufficient documentation

## 2017-01-10 DIAGNOSIS — F17210 Nicotine dependence, cigarettes, uncomplicated: Secondary | ICD-10-CM | POA: Diagnosis not present

## 2017-01-10 DIAGNOSIS — C539 Malignant neoplasm of cervix uteri, unspecified: Secondary | ICD-10-CM

## 2017-01-10 DIAGNOSIS — Z9221 Personal history of antineoplastic chemotherapy: Secondary | ICD-10-CM | POA: Diagnosis not present

## 2017-01-10 HISTORY — PX: TANDEM RING INSERTION: SHX6199

## 2017-01-10 SURGERY — INSERTION, UTERINE TANDEM AND RING OR CYLINDER, FOR BRACHYTHERAPY
Anesthesia: General

## 2017-01-10 MED ORDER — HYDROMORPHONE HCL 1 MG/ML IJ SOLN
INTRAMUSCULAR | Status: AC
  Filled 2017-01-10: qty 1

## 2017-01-10 MED ORDER — ACETAMINOPHEN 160 MG/5ML PO SOLN
960.0000 mg | Freq: Once | ORAL | Status: AC
  Administered 2017-01-10: 960 mg via ORAL
  Filled 2017-01-10: qty 40.6

## 2017-01-10 MED ORDER — HYDROMORPHONE HCL 1 MG/ML IJ SOLN
0.5000 mg | Freq: Once | INTRAMUSCULAR | Status: AC
  Administered 2017-01-10: 0.5 mg via INTRAVENOUS
  Filled 2017-01-10: qty 1

## 2017-01-10 MED ORDER — HYDROMORPHONE HCL 4 MG/ML IJ SOLN
INTRAMUSCULAR | Status: AC
  Filled 2017-01-10: qty 1

## 2017-01-10 MED ORDER — KETOROLAC TROMETHAMINE 30 MG/ML IJ SOLN
INTRAMUSCULAR | Status: DC | PRN
  Administered 2017-01-10: 30 mg via INTRAVENOUS

## 2017-01-10 MED ORDER — LIDOCAINE 2% (20 MG/ML) 5 ML SYRINGE
INTRAMUSCULAR | Status: AC
  Filled 2017-01-10: qty 5

## 2017-01-10 MED ORDER — FENTANYL CITRATE (PF) 100 MCG/2ML IJ SOLN
INTRAMUSCULAR | Status: DC | PRN
  Administered 2017-01-10 (×2): 50 ug via INTRAVENOUS

## 2017-01-10 MED ORDER — PROPOFOL 10 MG/ML IV BOLUS
INTRAVENOUS | Status: AC
  Filled 2017-01-10: qty 20

## 2017-01-10 MED ORDER — MIDAZOLAM HCL 2 MG/2ML IJ SOLN
INTRAMUSCULAR | Status: AC
  Filled 2017-01-10: qty 2

## 2017-01-10 MED ORDER — ONDANSETRON HCL 4 MG/2ML IJ SOLN
INTRAMUSCULAR | Status: DC | PRN
  Administered 2017-01-10: 4 mg via INTRAVENOUS

## 2017-01-10 MED ORDER — DEXAMETHASONE SODIUM PHOSPHATE 4 MG/ML IJ SOLN
INTRAMUSCULAR | Status: DC | PRN
  Administered 2017-01-10: 10 mg via INTRAVENOUS

## 2017-01-10 MED ORDER — DIPHENHYDRAMINE HCL 25 MG PO CAPS
ORAL_CAPSULE | ORAL | Status: AC
  Filled 2017-01-10: qty 1

## 2017-01-10 MED ORDER — LIDOCAINE 2% (20 MG/ML) 5 ML SYRINGE
INTRAMUSCULAR | Status: DC | PRN
  Administered 2017-01-10: 80 mg via INTRAVENOUS

## 2017-01-10 MED ORDER — FENTANYL CITRATE (PF) 100 MCG/2ML IJ SOLN
25.0000 ug | INTRAMUSCULAR | Status: DC | PRN
  Administered 2017-01-10: 50 ug via INTRAVENOUS
  Filled 2017-01-10: qty 1

## 2017-01-10 MED ORDER — KETOROLAC TROMETHAMINE 30 MG/ML IJ SOLN
INTRAMUSCULAR | Status: AC
  Filled 2017-01-10: qty 1

## 2017-01-10 MED ORDER — ONDANSETRON HCL 4 MG/2ML IJ SOLN
INTRAMUSCULAR | Status: AC
  Filled 2017-01-10: qty 2

## 2017-01-10 MED ORDER — PROPOFOL 10 MG/ML IV BOLUS
INTRAVENOUS | Status: DC | PRN
  Administered 2017-01-10: 180 mg via INTRAVENOUS

## 2017-01-10 MED ORDER — HYDROMORPHONE HCL 1 MG/ML IJ SOLN
INTRAMUSCULAR | Status: DC | PRN
  Administered 2017-01-10: 0.5 mg via INTRAVENOUS

## 2017-01-10 MED ORDER — ACETAMINOPHEN 160 MG/5ML PO SOLN
ORAL | Status: AC
  Filled 2017-01-10: qty 20.3

## 2017-01-10 MED ORDER — FENTANYL CITRATE (PF) 100 MCG/2ML IJ SOLN
INTRAMUSCULAR | Status: AC
  Filled 2017-01-10: qty 2

## 2017-01-10 MED ORDER — ACETAMINOPHEN 10 MG/ML IV SOLN
INTRAVENOUS | Status: AC
  Filled 2017-01-10: qty 100

## 2017-01-10 MED ORDER — DIPHENHYDRAMINE HCL 25 MG PO TABS
25.0000 mg | ORAL_TABLET | Freq: Once | ORAL | Status: AC
  Administered 2017-01-10: 25 mg via ORAL
  Filled 2017-01-10: qty 1

## 2017-01-10 MED ORDER — DEXAMETHASONE SODIUM PHOSPHATE 10 MG/ML IJ SOLN
INTRAMUSCULAR | Status: AC
  Filled 2017-01-10: qty 1

## 2017-01-10 MED ORDER — LACTATED RINGERS IV SOLN
INTRAVENOUS | Status: DC
  Administered 2017-01-10 (×2): via INTRAVENOUS
  Filled 2017-01-10 (×2): qty 1000

## 2017-01-10 MED ORDER — MIDAZOLAM HCL 5 MG/5ML IJ SOLN
INTRAMUSCULAR | Status: DC | PRN
  Administered 2017-01-10: 2 mg via INTRAVENOUS

## 2017-01-10 SURGICAL SUPPLY — 29 items
BAG URINE DRAINAGE (UROLOGICAL SUPPLIES) ×3 IMPLANT
BNDG CONFORM 2 STRL LF (GAUZE/BANDAGES/DRESSINGS) IMPLANT
CATH FOLEY 2WAY SLVR  5CC 16FR (CATHETERS) ×2
CATH FOLEY 2WAY SLVR 5CC 16FR (CATHETERS) ×1 IMPLANT
DRSG PAD ABDOMINAL 8X10 ST (GAUZE/BANDAGES/DRESSINGS) ×2 IMPLANT
GLOVE BIO SURGEON STRL SZ7.5 (GLOVE) ×6 IMPLANT
GOWN STRL REUS W/ TWL LRG LVL3 (GOWN DISPOSABLE) ×2 IMPLANT
GOWN STRL REUS W/TWL LRG LVL3 (GOWN DISPOSABLE) ×6
HOLDER FOLEY CATH W/STRAP (MISCELLANEOUS) ×3 IMPLANT
KIT RM TURNOVER CYSTO AR (KITS) ×3 IMPLANT
NDL SPNL 22GX3.5 QUINCKE BK (NEEDLE) IMPLANT
NEEDLE SPNL 22GX3.5 QUINCKE BK (NEEDLE) ×3 IMPLANT
PACK VAGINAL MINOR WOMEN LF (CUSTOM PROCEDURE TRAY) ×3 IMPLANT
PACKING VAGINAL (PACKING) IMPLANT
PAD ABD 8X10 STRL (GAUZE/BANDAGES/DRESSINGS) ×3 IMPLANT
PAD OB MATERNITY 4.3X12.25 (PERSONAL CARE ITEMS) IMPLANT
PLUG CATH AND CAP STER (CATHETERS) IMPLANT
SET IRRIG Y TYPE TUR BLADDER L (SET/KITS/TRAYS/PACK) ×3 IMPLANT
SUT PROLENE 0 SH 30 (SUTURE) IMPLANT
SUT SILK 2 0 30  PSL (SUTURE)
SUT SILK 2 0 30 PSL (SUTURE) IMPLANT
SYR BULB IRRIGATION 50ML (SYRINGE) IMPLANT
SYR CONTROL 10ML LL (SYRINGE) IMPLANT
SYRINGE 10CC LL (SYRINGE) ×3 IMPLANT
TOWEL OR 17X24 6PK STRL BLUE (TOWEL DISPOSABLE) ×6 IMPLANT
TUBE CONNECTING 12'X1/4 (SUCTIONS)
TUBE CONNECTING 12X1/4 (SUCTIONS) IMPLANT
WATER STERILE IRR 3000ML UROMA (IV SOLUTION) ×3 IMPLANT
WATER STERILE IRR 500ML POUR (IV SOLUTION) ×3 IMPLANT

## 2017-01-10 NOTE — H&P (View-Only) (Signed)
Radiation Oncology         [phone] ________________________________  History and physical examination  Name: Joanne Griffin MRN: [id number]  Date: 12/03/2016  DOB: Nov 05, 1985   DIAGNOSIS: 31 year-old with invasive adenocarcinoma of the cervix, clinical stage IB2 adenocarcinoma cervix with radiographically suspicious pelvic lymphadenopathy and left supraclavicular lymphadenopathy  HISTORY OF PRESENT ILLNESS: Joanne Griffin is a 31 y.o. female who presented to the ER on 07/29/16 complaining of flank pain. She was treated for a UTI and had resolution of the flank pain although, she continued to have suprapubic pain. She has had irregular bleeding and a copious discharge for several months. Ultimately she had a Pap smear showing atypical glandular cells and a cervical biopsy on 09/01/16 revealed invasive adenocarcinoma.   PET scan revealed intense uptake in the cervix as well as upper pelvic nodal areas. In addition PET scan showed a left supraclavicular lymph node which was also palpable. The patient has recently completed her external beam radiation therapy now be taken to the operating room for placement of a tandem/ring in preparation for high-dose rate radiation therapy.   PREVIOUS RADIATION THERAPY: Yes , pelvis received 45 gray. A boost to the abnormal pelvic lymphadenopathy to 54 gray in upper pelvis region. The left supraclavicular region received 54 gray  PAST MEDICAL HISTORY:  has a past medical history of Anxiety; Cervical cancer [facility]) (oncologist-  dr gorsuch/  dr Sondra Come); Depression; History of cancer chemotherapy (11-09-2016 to 12-08-2016); History of radiation therapy (11-09-2016  to 12-22-2016); and Leukopenia due to antineoplastic chemotherapy ([place]).    PAST SURGICAL HISTORY: Past Surgical History:  Procedure Laterality Date  . CYST REMOVAL HAND  age 69  . IR FLUORO GUIDE PORT INSERTION RIGHT  10/29/2016  . IR US GUIDE VASC ACCESS RIGHT  10/29/2016  . WISDOM TOOTH EXTRACTION     times 4    FAMILY HISTORY: family history includes Breast cancer in her paternal grandmother; Diabetes in her maternal grandmother.  SOCIAL HISTORY:  reports that she has been smoking Cigarettes.  She has a 3.00 pack-year smoking history. She has never used smokeless tobacco. She reports that she does not drink alcohol or use drugs.  ALLERGIES: Amoxicillin and Penicillins  MEDICATIONS:  No current facility-administered medications for this encounter.    Current Outpatient Prescriptions  Medication Sig Dispense Refill  . buPROPion (WELLBUTRIN) 100 MG tablet Take 1 tablet (100 mg total) by mouth 2 (two) times daily. 60 tablet 1  . hyaluronate sodium (RADIAPLEXRX) GEL Apply 1 application topically 2 (two) times daily as needed.     Marland Kitchen LORazepam (ATIVAN) 0.5 MG tablet Take 1 tablet (0.5 mg total) by mouth every 8 (eight) hours. As Needed for anxiety or nausea 60 tablet 0  . morphine (MSIR) 15 MG tablet Take 1 tablet (15 mg total) by mouth every 6 (six) hours as needed for severe pain. 60 tablet 0  . ondansetron (ZOFRAN) 8 MG tablet Take 1 tablet (8 mg total) by mouth every 8 (eight) hours as needed. 60 tablet 3  . prochlorperazine (COMPAZINE) 10 MG tablet Take 1 tablet (10 mg total) by mouth every 6 (six) hours as needed (Nausea or vomiting). 60 tablet 3  . silver sulfADIAZINE (SILVADENE) 1 % cream Apply 1 application topically as needed.     Marland Kitchen levofloxacin (LEVAQUIN) 500 MG tablet Take 1 tablet (500 mg total) by mouth daily. (Patient taking differently: Take 500 mg by mouth as directed. Per pt as directed if gets fever and if feel like  getting a cold) 10 tablet 0    REVIEW OF SYSTEMS:  A 15 point review of systems is documented in the electronic medical record. This was obtained by the nursing staff. However, I reviewed this with the patient to discuss relevant findings and make appropriate changes.     PHYSICAL EXAM:   height is 5\' 2"  (1.575 m) and weight is 147 lb (66.7 kg). Her blood  pressure is 116/78 and her pulse is 92. Her oxygen saturation is 100%.   General: Alert and oriented, in no acute distress, accompanied by aunt and mother on evaluation today HEENT: Head is normocephalic. Extraocular movements are intact. Oropharynx is clear. Neck: Neck is supple. 1.5 cm lymph node in the left posterior triangle of the neck, mildly tender with palpation. Heart: Regular in rate and rhythm with no murmurs, rubs, or gallops. Chest: Clear to auscultation bilaterally, with no rhonchi, wheezes, or rales.  Abdomen: Soft, nontender, nondistended, with no rigidity or guarding. Extremities: No cyanosis or edema. Lymphatics: see Neck Exam Skin: No concerning lesions. Musculoskeletal: symmetric strength and muscle tone throughout. Neurologic: Cranial nerves II through XII are grossly intact. No obvious focalities. Speech is fluent. Coordination is intact. Psychiatric: Judgment and insight are intact. Affect is appropriate. EGBUS: Normal female Vagina: Normal, no lesions Cervix: Cervix is replaced by an exophytic nodular lesion which is slightly friable measuring approximately 4 cm in diameter. The lesion bleeds easily with exam. Bi-manual examination:    No obvious parametrial involvement  Rectal: normal sphincter tone, no masses, no blood, confirms bimanual exam    LABORATORY DATA:  Lab Results  Component Value Date   WBC 2.0 (L) 01/03/2017   HGB 10.9 (L) 01/03/2017   HCT 32.0 (L) 01/03/2017   MCV 89.2 01/03/2017   PLT 336 01/03/2017   NEUTROABS 1.3 (L) 01/03/2017   Lab Results  Component Value Date   NA 136 12/30/2016   K 3.9 12/30/2016   CL 106 12/30/2016   CO2 25 12/30/2016   GLUCOSE 119 (H) 12/30/2016   CREATININE 0.74 12/30/2016   CALCIUM 8.8 (L) 12/30/2016      RADIOGRAPHY: No results found.    IMPRESSION: Stage IV adenocarcinoma of the cervix by virtue of solitary left supraclavicular lymph node. The patient is being treated with curative intent has completed  external beam and radiosensitizing chemotherapy. She is now ready to proceed with brachytherapy directed at the cervical region. She will likely proceed with full dose chemotherapy after his she has completed her brachytherapy.  PLAN: Patient will be taken to the operating room on July 10 for her first high-dose-rate treatment. The patient will receive a total of 5 high-dose rate treatments using the tandem ring system.  Iridium 192 will be the high-dose-rate source.     ------------------------------------------------  Blair Promise, PhD, MD

## 2017-01-10 NOTE — Progress Notes (Signed)
  Radiation Oncology         [phone]) [phone] ________________________________  Name: Joanne Griffin MRN: [id number]  Date: 01/10/2017  DOB: 1986-01-21  SIMULATION AND TREATMENT PLANNING NOTE HDR BRACHYTHERAPY  DIAGNOSIS:  31 year-old with invasive adenocarcinoma of the cervix, clinical stage IB2 adenocarcinoma cervix with radiographically suspicious pelvic lymphadenopathy and left supraclavicular lymphadenopathy  NARRATIVE:  The patient was brought to the [facility].  Identity was confirmed.  All relevant records and images related to the planned course of therapy were reviewed.  The patient freely provided informed written consent to proceed with treatment after reviewing the details related to the planned course of therapy. The consent form was witnessed and verified by the simulation staff.  Then, the patient was set-up in a stable reproducible  supine position for radiation therapy.  CT images were obtained.  Surface markings were placed.  The CT images were loaded into the planning software.  Then the target and avoidance structures were contoured.  Treatment planning then occurred.  The radiation prescription was entered and confirmed.   I have requested : Brachytherapy Isodose Plan and Dosimetry Calculations to plan the radiation distribution.    PLAN:  The patient will receive 5.5 Gy in 1 fraction.  Patient will be treated with iridium 192 as the high-dose-rate source. Tandem ring equipment with 45 orientation.    ________________________________  Blair Promise, PhD, MD

## 2017-01-10 NOTE — Anesthesia Procedure Notes (Signed)
Procedure Name: LMA Insertion Date/Time: 01/10/2017 7:40 AM Performed by: Lyndle Herrlich Pre-anesthesia Checklist: Patient identified, Emergency Drugs available, Suction available and Patient being monitored Patient Re-evaluated:Patient Re-evaluated prior to induction Oxygen Delivery Method: Circle system utilized Preoxygenation: Pre-oxygenation with 100% oxygen Induction Type: IV induction Ventilation: Mask ventilation without difficulty LMA: LMA inserted LMA Size: 4.0 Number of attempts: 1 Airway Equipment and Method: Bite block Placement Confirmation: positive ETCO2 Tube secured with: Tape Dental Injury: Teeth and Oropharynx as per pre-operative assessment

## 2017-01-10 NOTE — Telephone Encounter (Signed)
Left a message for Dr. Calton Dach office regarding her follow up appointment at 11:30 today.  Advised that patient is currently in radiation waiting for a tandem and ring treatment.

## 2017-01-10 NOTE — Telephone Encounter (Signed)
Scheduled appt per 7/16 sch message from Dr. Alvy Bimler - patient is aware of appt date and time.

## 2017-01-10 NOTE — Progress Notes (Signed)
IMMEDIATELY FOLLOWING SURGERY: Do not drive or operate machinery for the first twenty four hours after surgery. Do not make any important decisions for twenty four hours after surgery or while taking narcotic pain medications or sedatives. If you develop intractable nausea and vomiting or a severe headache please notify your doctor immediately.   FOLLOW-UP: You do not need to follow up with anesthesia unless specifically instructed to do so.   WOUND CARE INSTRUCTIONS (if applicable): Expect some mild vaginal bleeding, but if large amount of bleeding occurs please contact Dr. Sondra Come at [phone] or the Radiation On-Call physician. Call for any fever greater than 101.0 degrees or increasing vaginal//abdominal pain or trouble urinating.   QUESTIONS?: Please feel free to call your physician or the hospital operator if you have any questions, and they will be happy to assist you. Resume all medications: as listed on your after visit summary. Your next appointment is:  Future Appointments Date Time Provider [place]  01/17/2017 6:30 AM WL-US 1 WL-US [place]  01/17/2017 10:00 AM Gery Pray, MD [facility] None  01/17/2017 2:00 PM Gery Pray, MD [facility] None  01/20/2017 6:30 AM WL-US 1 WL-US [place]  01/20/2017 10:00 AM Gery Pray, MD [facility] None  01/20/2017 1:00 PM Gery Pray, MD [place] None  01/24/2017 6:00 AM WL-US 1 WL-US [place]  01/24/2017 10:00 AM Gery Pray, MD [facility] None  01/24/2017 2:00 PM Gery Pray, MD [facility] None  01/28/2017 11:15 AM [facility] LAB 3 [facility] None  01/28/2017 11:45 AM [facility] J32 DNS [facility] None  01/28/2017 12:15 PM Heath Lark, MD [facility] None  02/25/2017 1:15 PM [name], MD [facility] None

## 2017-01-10 NOTE — Interval H&P Note (Signed)
History and Physical Interval Note:  01/10/2017 7:38 AM  Joanne Griffin  has presented today for surgery, with the diagnosis of CERVICAL CANCER  The various methods of treatment have been discussed with the patient and family. After consideration of risks, benefits and other options for treatment, the patient has consented to  Procedure(s): TANDEM RING INSERTION (N/A) as a surgical intervention .  The patient's history has been reviewed, patient examined, no change in status, stable for surgery.  I have reviewed the patient's chart and labs.  Questions were answered to the patient's satisfaction.     Gery Pray

## 2017-01-10 NOTE — Transfer of Care (Signed)
  Last Vitals:  Vitals:   01/10/17 0622 01/10/17 0820  BP: 101/65   Pulse: 77 (P) 83  Resp: 16 (P) 16  Temp: (!) 36.3 C (!) (P) 36.3 C    Last Pain:  Vitals:   01/10/17 0622  TempSrc: Oral      Patients Stated Pain Goal: 5 (01/10/17 5498)  Immediate Anesthesia Transfer of Care Note  Patient: Joanne Griffin  Procedure(s) Performed: Procedure(s) (LRB): TANDEM RING INSERTION (N/A)  Patient Location: PACU  Anesthesia Type: General  Level of Consciousness: awake, alert  and oriented  Airway & Oxygen Therapy: Patient Spontanous Breathing and Patient connected to nasal cannula oxygen  Post-op Assessment: Report given to PACU RN and Post -op Vital signs reviewed and stable  Post vital signs: Reviewed and stable  Complications: No apparent anesthesia complications

## 2017-01-10 NOTE — Brief Op Note (Signed)
01/10/2017  8:45 AM  PATIENT:  Joanne Griffin  31 y.o. female  PRE-OPERATIVE DIAGNOSIS:  CERVICAL CANCER  POST-OPERATIVE DIAGNOSIS:  CERVICAL CANCER  PROCEDURE:  Procedure(s): TANDEM RING INSERTION (N/A)  SURGEON:  Surgeon(s) and Role:    * Gery Pray, MD - Primary  PHYSICIAN ASSISTANT:   ASSISTANTS: none   ANESTHESIA:   general  EBL:  Total I/O In: 1000 [I.V.:1000] Out: 10 [Blood:10]  BLOOD ADMINISTERED:none  DRAINS: Urinary Catheter (Foley)   LOCAL MEDICATIONS USED:  NONE  SPECIMEN:  No Specimen  DISPOSITION OF SPECIMEN:  N/A  COUNTS:  YES  TOURNIQUET:  * No tourniquets in log *  DICTATION: The patient was taken to the outpatient OR #1. A general anesthetic was applied. Timeout was performed for the length of the procedure, preoperative antibiotics (none) and allergies. The patient was prepped and draped in the usual sterile fashion and placed in the dorsolithotomy position. A minimal amount of Trendelenburg position was used during the procedure.  . The uterus sounded to approximately 6 cm. The uterus was noted to be in moderate anteverted position. Patient had dilation of the cervix.  Patient then had placement of a 60 mm cervical sleeve. Excellent images were obtained with ultrasound. Patient then had a 60 mm 45 tandem placed within the cervical canal and inside the endometrial cavity. Good placement was noted. Patient then had a 45 ring with a small shielding cap placed within the upper vaginal vault. This was then followed by placement of a rectal paddle posteriorly. The equipment was affixed to each other securely. There was no room for vaginal packing. Intraoperative ultrasound at completion of the procedure showed continued good placement of the tandem within the uterine canal. Patient tolerated the procedure well. She will be transferred to radiation oncology for planning and her second high-dose-rate treatment. The patient is scheduled received 5 high-dose  treatments with iridium 192 as the high-dose-rate source.  PLAN OF CARE: Transferred to radiation oncology for planning and treatment  PATIENT DISPOSITION:  PACU - hemodynamically stable.   Delay start of Pharmacological VTE agent (>24hrs) due to surgical blood loss or risk of bleeding: not applicable

## 2017-01-10 NOTE — Progress Notes (Signed)
Foley catheter removed intact.  Patient tolerated well.  Assisted Dr. Sondra Come with tandem removal.  Removed left hand IV intact.  Pressure and band aid applied.  Assisted patient with dressing.  She was given discharge instructions and paperwork.  Escorted her to the lobby to meet her family.

## 2017-01-10 NOTE — Progress Notes (Signed)
Patient reports having pain at a 5/10 in the vaginal area.  She also is having generalized itching which started after she was given pain medication today.  She was given 0.5 mg dilaudid IV and 25 mg of benadryl per Dr. Sondra Come.

## 2017-01-10 NOTE — Anesthesia Postprocedure Evaluation (Signed)
Anesthesia Post Note  Patient: Joanne Griffin  Procedure(s) Performed: Procedure(s) (LRB): TANDEM RING INSERTION (N/A)     Patient location during evaluation: PACU Anesthesia Type: General Level of consciousness: awake and alert Pain management: pain level controlled Vital Signs Assessment: post-procedure vital signs reviewed and stable Respiratory status: spontaneous breathing, nonlabored ventilation, respiratory function stable and patient connected to nasal cannula oxygen Cardiovascular status: blood pressure returned to baseline and stable Postop Assessment: no signs of nausea or vomiting Anesthetic complications: no    Last Vitals:  Vitals:   01/10/17 0845 01/10/17 0900  BP: (!) 146/83 (!) 144/86  Pulse: 86 63  Resp: (!) 21 12  Temp:  (!) 36.3 C    Last Pain:  Vitals:   01/10/17 0845  TempSrc:   PainSc: Asleep                 Avon Mergenthaler EDWARD

## 2017-01-10 NOTE — Progress Notes (Signed)
  Radiation Oncology         [phone]) [phone] ________________________________  Name: Joanne Griffin MRN: [id number]  Date: 01/10/2017  DOB: 22-Apr-1986  CC: Patient, No Pcp Per  [name],*  HDR BRACHYTHERAPY NOTE  DIAGNOSIS: 31 y.o. female with invasive adenocarcinoma of the cervix, clinical stage IB2 adenocarcinoma cervix with radiographically suspicious pelvic lymphadenopathy and left supraclavicular lymphadenopathy.  NARRATIVE: The patient was brought to the HDR suite. Identity was confirmed. All relevant records and images related to the planned course of therapy were reviewed. The patient freely provided informed written consent to proceed with treatment after reviewing the details related to the planned course of therapy. The consent form was witnessed and verified by the simulation staff. Then, the patient was set-up in a stable reproducible supine position for radiation therapy. The tandem ring system was accessed and fiducial markers were placed within the tandem and ring.   Simple treatment device note: On the operating room the patient had construction of her custom tandem ring system. She will be treated with a 45 tandem/ring system. The patient had placement of a 60 mm tandem. A cervical ring with a small shielding was used for her treatment. A rectal paddle was also part of her custom set up device.  Verification simulation note: An AP and lateral film was obtained through the pelvis area. This was compared to the patient's planning films documenting accurate position of the tandem/ring system for treatment.  High-dose-rate brachytherapy treatment note:  The remote afterloading device was accessed through catheter system and attached to the tandem ring system. Patient then proceeded to undergo her second high-dose-rate treatment directed at the cervix. The patient was prescribed a dose of 5.5 gray to be delivered to the [place].Marland Kitchen Patient was treated with two channels  using 21 dwell positions. Treatment time was 302.4 seconds. The patient tolerated the procedure well. After completion of her therapy, a radiation survey was performed documenting return of the iridium source into the Gamma[employer] safe. The patient was then transferred to the nursing suite. She then had removal of the rectal paddle followed by the tandem and ring system. The patient tolerated the removal well.  PLAN: The patient will return on 01/17/2017 for her third HDR treatment. ________________________________    Blair Promise, PhD, MD  This document serves as a record of services personally performed by Gery Pray, MD. It was created on his behalf by Rae Lips, a trained medical scribe. The creation of this record is based on the scribe's personal observations and the provider's statements to them. This document has been checked and approved by the attending provider.

## 2017-01-11 ENCOUNTER — Encounter (HOSPITAL_BASED_OUTPATIENT_CLINIC_OR_DEPARTMENT_OTHER): Payer: Self-pay | Admitting: Radiation Oncology

## 2017-01-14 ENCOUNTER — Encounter (HOSPITAL_BASED_OUTPATIENT_CLINIC_OR_DEPARTMENT_OTHER): Payer: Self-pay | Admitting: *Deleted

## 2017-01-14 NOTE — Progress Notes (Signed)
NPO AFTER MN.  ARRIVE AT 0600.  CURRENT LAB RESULTS IN CHART AND EPIC.

## 2017-01-17 ENCOUNTER — Ambulatory Visit (HOSPITAL_BASED_OUTPATIENT_CLINIC_OR_DEPARTMENT_OTHER)
Admission: RE | Admit: 2017-01-17 | Discharge: 2017-01-17 | Disposition: A | Discharge status: Home / Self Care | Payer: Medicaid Other | Source: Ambulatory Visit | Attending: Radiation Oncology | Admitting: Radiation Oncology

## 2017-01-17 ENCOUNTER — Ambulatory Visit (HOSPITAL_BASED_OUTPATIENT_CLINIC_OR_DEPARTMENT_OTHER): Payer: Medicaid Other | Admitting: Anesthesiology

## 2017-01-17 ENCOUNTER — Ambulatory Visit
Admission: RE | Admit: 2017-01-17 | Discharge: 2017-01-17 | Disposition: A | Discharge status: Home / Self Care | Payer: Medicaid Other | Source: Ambulatory Visit | Attending: Radiation Oncology | Admitting: Radiation Oncology

## 2017-01-17 ENCOUNTER — Ambulatory Visit (HOSPITAL_COMMUNITY)
Admission: RE | Admit: 2017-01-17 | Discharge: 2017-01-17 | Disposition: A | Discharge status: Home / Self Care | Payer: Medicaid Other | Source: Ambulatory Visit | Attending: Radiation Oncology | Admitting: Radiation Oncology

## 2017-01-17 ENCOUNTER — Encounter (HOSPITAL_BASED_OUTPATIENT_CLINIC_OR_DEPARTMENT_OTHER)
Admission: RE | Disposition: A | Discharge status: Home / Self Care | Payer: Self-pay | Source: Ambulatory Visit | Attending: Radiation Oncology

## 2017-01-17 ENCOUNTER — Encounter (HOSPITAL_BASED_OUTPATIENT_CLINIC_OR_DEPARTMENT_OTHER): Payer: Self-pay

## 2017-01-17 VITALS — BP 129/94 | HR 76 | Resp 14

## 2017-01-17 DIAGNOSIS — F419 Anxiety disorder, unspecified: Secondary | ICD-10-CM | POA: Insufficient documentation

## 2017-01-17 DIAGNOSIS — C539 Malignant neoplasm of cervix uteri, unspecified: Secondary | ICD-10-CM | POA: Insufficient documentation

## 2017-01-17 DIAGNOSIS — Z803 Family history of malignant neoplasm of breast: Secondary | ICD-10-CM | POA: Diagnosis not present

## 2017-01-17 DIAGNOSIS — R590 Localized enlarged lymph nodes: Secondary | ICD-10-CM | POA: Insufficient documentation

## 2017-01-17 DIAGNOSIS — Z79899 Other long term (current) drug therapy: Secondary | ICD-10-CM | POA: Diagnosis not present

## 2017-01-17 DIAGNOSIS — Z880 Allergy status to penicillin: Secondary | ICD-10-CM | POA: Diagnosis not present

## 2017-01-17 DIAGNOSIS — F329 Major depressive disorder, single episode, unspecified: Secondary | ICD-10-CM | POA: Insufficient documentation

## 2017-01-17 DIAGNOSIS — Z9221 Personal history of antineoplastic chemotherapy: Secondary | ICD-10-CM | POA: Diagnosis not present

## 2017-01-17 DIAGNOSIS — F17210 Nicotine dependence, cigarettes, uncomplicated: Secondary | ICD-10-CM | POA: Insufficient documentation

## 2017-01-17 DIAGNOSIS — Z510 Encounter for antineoplastic radiation therapy: Secondary | ICD-10-CM | POA: Insufficient documentation

## 2017-01-17 DIAGNOSIS — C530 Malignant neoplasm of endocervix: Secondary | ICD-10-CM

## 2017-01-17 HISTORY — PX: TANDEM RING INSERTION: SHX6199

## 2017-01-17 SURGERY — INSERTION, UTERINE TANDEM AND RING OR CYLINDER, FOR BRACHYTHERAPY
Anesthesia: General | Site: Vagina

## 2017-01-17 MED ORDER — PROPOFOL 10 MG/ML IV BOLUS
INTRAVENOUS | Status: DC | PRN
  Administered 2017-01-17: 200 mg via INTRAVENOUS

## 2017-01-17 MED ORDER — LACTATED RINGERS IV SOLN
INTRAVENOUS | Status: DC
  Filled 2017-01-17: qty 250

## 2017-01-17 MED ORDER — LACTATED RINGERS IV SOLN
INTRAVENOUS | Status: DC
  Administered 2017-01-17: 07:00:00 via INTRAVENOUS
  Filled 2017-01-17 (×2): qty 1000

## 2017-01-17 MED ORDER — HYDROMORPHONE HCL-NACL 0.5-0.9 MG/ML-% IV SOSY
PREFILLED_SYRINGE | INTRAVENOUS | Status: AC
  Filled 2017-01-17: qty 1

## 2017-01-17 MED ORDER — HYDROMORPHONE HCL 1 MG/ML IJ SOLN
0.5000 mg | Freq: Once | INTRAMUSCULAR | Status: AC
  Administered 2017-01-17: 0.5 mg via INTRAVENOUS
  Filled 2017-01-17: qty 1

## 2017-01-17 MED ORDER — ACETAMINOPHEN 10 MG/ML IV SOLN
INTRAVENOUS | Status: DC | PRN
  Administered 2017-01-17: 1000 mg via INTRAVENOUS

## 2017-01-17 MED ORDER — MIDAZOLAM HCL 2 MG/2ML IJ SOLN
INTRAMUSCULAR | Status: AC
Start: 2017-01-17 — End: 2017-01-17
  Filled 2017-01-17: qty 2

## 2017-01-17 MED ORDER — FENTANYL CITRATE (PF) 100 MCG/2ML IJ SOLN
25.0000 ug | INTRAMUSCULAR | Status: DC | PRN
  Administered 2017-01-17 (×2): 50 ug via INTRAVENOUS
  Filled 2017-01-17: qty 1

## 2017-01-17 MED ORDER — KETOROLAC TROMETHAMINE 30 MG/ML IJ SOLN
INTRAMUSCULAR | Status: AC
Start: 2017-01-17 — End: 2017-01-17
  Filled 2017-01-17: qty 1

## 2017-01-17 MED ORDER — ONDANSETRON HCL 4 MG/2ML IJ SOLN
INTRAMUSCULAR | Status: DC | PRN
  Administered 2017-01-17: 4 mg via INTRAVENOUS

## 2017-01-17 MED ORDER — LIDOCAINE HCL (CARDIAC) 20 MG/ML IV SOLN
INTRAVENOUS | Status: DC | PRN
  Administered 2017-01-17: 100 mg via INTRAVENOUS

## 2017-01-17 MED ORDER — HYDROMORPHONE HCL 1 MG/ML IJ SOLN
1.0000 mg | Freq: Once | INTRAMUSCULAR | Status: AC
  Administered 2017-01-17: 1 mg via INTRAVENOUS
  Filled 2017-01-17: qty 1

## 2017-01-17 MED ORDER — LORAZEPAM 0.5 MG PO TABS: 0.5000 mg | ORAL_TABLET | Freq: Three times a day (TID) | ORAL | 0 refills | Status: DC

## 2017-01-17 MED ORDER — FENTANYL CITRATE (PF) 100 MCG/2ML IJ SOLN
INTRAMUSCULAR | Status: AC
  Filled 2017-01-17: qty 2

## 2017-01-17 MED ORDER — WATER FOR IRRIGATION, STERILE IR SOLN
Status: DC | PRN
  Administered 2017-01-17: 3000 mL

## 2017-01-17 MED ORDER — MORPHINE SULFATE 15 MG PO TABS: 15.0000 mg | ORAL_TABLET | Freq: Four times a day (QID) | ORAL | 0 refills | Status: DC | PRN

## 2017-01-17 MED ORDER — ONDANSETRON HCL 4 MG/2ML IJ SOLN
INTRAMUSCULAR | Status: AC
  Filled 2017-01-17: qty 2

## 2017-01-17 MED ORDER — HYDROMORPHONE HCL 1 MG/ML IJ SOLN
INTRAMUSCULAR | Status: DC | PRN
  Administered 2017-01-17: .5 mg via INTRAVENOUS
  Administered 2017-01-17: 0.5 mg via INTRAVENOUS

## 2017-01-17 MED ORDER — LIDOCAINE 2% (20 MG/ML) 5 ML SYRINGE
INTRAMUSCULAR | Status: AC
  Filled 2017-01-17: qty 5

## 2017-01-17 MED ORDER — ARTIFICIAL TEARS OPHTHALMIC OINT
TOPICAL_OINTMENT | OPHTHALMIC | Status: AC
  Filled 2017-01-17: qty 3.5

## 2017-01-17 MED ORDER — PROPOFOL 10 MG/ML IV BOLUS
INTRAVENOUS | Status: AC
  Filled 2017-01-17: qty 40

## 2017-01-17 MED ORDER — HYDROMORPHONE HCL 1 MG/ML IJ SOLN
INTRAMUSCULAR | Status: AC
  Filled 2017-01-17: qty 1

## 2017-01-17 MED ORDER — DEXAMETHASONE SODIUM PHOSPHATE 4 MG/ML IJ SOLN
INTRAMUSCULAR | Status: DC | PRN
  Administered 2017-01-17: 10 mg via INTRAVENOUS

## 2017-01-17 MED ORDER — MIDAZOLAM HCL 5 MG/5ML IJ SOLN
INTRAMUSCULAR | Status: DC | PRN
  Administered 2017-01-17: 2 mg via INTRAVENOUS

## 2017-01-17 MED ORDER — FENTANYL CITRATE (PF) 100 MCG/2ML IJ SOLN
INTRAMUSCULAR | Status: DC | PRN
  Administered 2017-01-17 (×2): 50 ug via INTRAVENOUS

## 2017-01-17 MED ORDER — DIPHENHYDRAMINE HCL 25 MG PO CAPS
25.0000 mg | ORAL_CAPSULE | Freq: Once | ORAL | Status: AC
  Administered 2017-01-17: 25 mg via ORAL
  Filled 2017-01-17: qty 1

## 2017-01-17 MED ORDER — ACETAMINOPHEN 10 MG/ML IV SOLN
INTRAVENOUS | Status: AC
  Filled 2017-01-17: qty 100

## 2017-01-17 MED ORDER — KETOROLAC TROMETHAMINE 30 MG/ML IJ SOLN
INTRAMUSCULAR | Status: DC | PRN
  Administered 2017-01-17: 30 mg via INTRAVENOUS

## 2017-01-17 MED ORDER — DIPHENHYDRAMINE HCL 25 MG PO CAPS
ORAL_CAPSULE | ORAL | Status: AC
  Filled 2017-01-17: qty 1

## 2017-01-17 MED ORDER — DEXAMETHASONE SODIUM PHOSPHATE 10 MG/ML IJ SOLN
INTRAMUSCULAR | Status: AC
  Filled 2017-01-17: qty 1

## 2017-01-17 SURGICAL SUPPLY — 33 items
BAG URINE DRAINAGE (UROLOGICAL SUPPLIES) ×3 IMPLANT
BNDG CONFORM 2 STRL LF (GAUZE/BANDAGES/DRESSINGS) IMPLANT
CATH FOLEY 2WAY SLVR  5CC 16FR (CATHETERS) ×2
CATH FOLEY 2WAY SLVR 5CC 16FR (CATHETERS) ×1 IMPLANT
GLOVE BIO SURGEON STRL SZ7.5 (GLOVE) ×6 IMPLANT
GLOVE BIOGEL PI IND STRL 7.0 (GLOVE) IMPLANT
GLOVE BIOGEL PI IND STRL 7.5 (GLOVE) IMPLANT
GLOVE BIOGEL PI INDICATOR 7.0 (GLOVE) ×2
GLOVE BIOGEL PI INDICATOR 7.5 (GLOVE) ×2
GLOVE ECLIPSE 7.0 STRL STRAW (GLOVE) ×2 IMPLANT
GOWN STRL REUS W/ TWL LRG LVL3 (GOWN DISPOSABLE) ×2 IMPLANT
GOWN STRL REUS W/TWL LRG LVL3 (GOWN DISPOSABLE) ×6
HOLDER FOLEY CATH W/STRAP (MISCELLANEOUS) ×3 IMPLANT
KIT RM TURNOVER CYSTO AR (KITS) ×3 IMPLANT
NDL SPNL 22GX3.5 QUINCKE BK (NEEDLE) IMPLANT
NEEDLE SPNL 22GX3.5 QUINCKE BK (NEEDLE) IMPLANT
PACK VAGINAL MINOR WOMEN LF (CUSTOM PROCEDURE TRAY) ×3 IMPLANT
PACKING VAGINAL (PACKING) IMPLANT
PAD ABD 8X10 STRL (GAUZE/BANDAGES/DRESSINGS) ×3 IMPLANT
PAD OB MATERNITY 4.3X12.25 (PERSONAL CARE ITEMS) IMPLANT
PLUG CATH AND CAP STER (CATHETERS) IMPLANT
SET IRRIG Y TYPE TUR BLADDER L (SET/KITS/TRAYS/PACK) ×3 IMPLANT
SUT PROLENE 0 SH 30 (SUTURE) IMPLANT
SUT SILK 2 0 30  PSL (SUTURE)
SUT SILK 2 0 30 PSL (SUTURE) IMPLANT
SYR BULB IRRIGATION 50ML (SYRINGE) IMPLANT
SYR CONTROL 10ML LL (SYRINGE) IMPLANT
SYRINGE 10CC LL (SYRINGE) ×3 IMPLANT
TOWEL OR 17X24 6PK STRL BLUE (TOWEL DISPOSABLE) ×6 IMPLANT
TUBE CONNECTING 12'X1/4 (SUCTIONS)
TUBE CONNECTING 12X1/4 (SUCTIONS) IMPLANT
WATER STERILE IRR 3000ML UROMA (IV SOLUTION) ×3 IMPLANT
WATER STERILE IRR 500ML POUR (IV SOLUTION) ×3 IMPLANT

## 2017-01-17 NOTE — Progress Notes (Signed)
  Radiation Oncology         [phone]) [phone] ________________________________  Name: Joanne Griffin MRN: [id number]  Date: 01/17/2017  DOB: December 07, 1985  CC: Patient, No Pcp Per  [name],*  HDR BRACHYTHERAPY NOTE  DIAGNOSIS:  31 year-old with invasive adenocarcinoma of the cervix, clinical stage IB2 adenocarcinoma cervix with radiographically suspicious pelvic lymphadenopathy and left supraclavicular lymphadenopathy.  NARRATIVE: The patient was brought to the HDR suite. Identity was confirmed. All relevant records and images related to the planned course of therapy were reviewed. The patient freely provided informed written consent to proceed with treatment after reviewing the details related to the planned course of therapy. The consent form was witnessed and verified by the simulation staff. Then, the patient was set-up in a stable reproducible supine position for radiation therapy. The tandem ring system was accessed and fiducial markers were placed within the tandem and ring.   Simple treatment device note: On the operating room the patient had construction of her custom tandem ring system. She will be treated with a 45 tandem/ring system. The patient had placement of a 60 mm tandem. A cervical ring with a small shielding was used for her treatment. A rectal paddle was also part of her custom set up device.  Verification simulation note: An AP and lateral film was obtained through the pelvis area. This was compared to the patient's planning films documenting accurate position of the tandem/ring system for treatment.  High-dose-rate brachytherapy treatment note:  The remote afterloading device was accessed through catheter system and attached to the tandem ring system. Patient then proceeded to undergo her third high-dose-rate treatment directed at the cervix. The patient was prescribed a dose of 5.5 gray to be delivered to the [place].Marland Kitchen Patient was treated with two channels using  24 dwell positions. Treatment time was 305 seconds. The patient tolerated the procedure well. After completion of her therapy, a radiation survey was performed documenting return of the iridium source into the GammaMed safe. The patient was then transferred to the nursing suite. She then had removal of the rectal paddle followed by the tandem and ring system. The patient tolerated the removal well.  PLAN: The patient will return next week for her fourth HDR treatment ________________________________  Blair Promise, PhD, MD This document serves as a record of services personally performed by Gery Pray, MD. It was created on his behalf by Valeta Harms, a trained medical scribe. The creation of this record is based on the scribe's personal observations and the provider's statements to them. This document has been checked and approved by the attending provider.

## 2017-01-17 NOTE — Anesthesia Procedure Notes (Addendum)
Procedure Name: LMA Insertion Date/Time: 01/17/2017 7:41 AM Performed by: Lyndle Herrlich Pre-anesthesia Checklist: Patient identified, Emergency Drugs available, Suction available and Patient being monitored Patient Re-evaluated:Patient Re-evaluated prior to induction Oxygen Delivery Method: Circle system utilized Preoxygenation: Pre-oxygenation with 100% oxygen Induction Type: IV induction Ventilation: Mask ventilation without difficulty LMA: LMA inserted LMA Size: 4.0 Number of attempts: 1 Airway Equipment and Method: Bite block Placement Confirmation: positive ETCO2 Tube secured with: Tape Dental Injury: Teeth and Oropharynx as per pre-operative assessment

## 2017-01-17 NOTE — Progress Notes (Signed)
Patient reports having pain at a 7/10 in her vaginal area and also reports that she has generalized itching.  She was given 0.5 mg of dilaudid IV and 25 mg of benadryl PO per Dr. Sondra Come.

## 2017-01-17 NOTE — H&P (View-Only) (Signed)
Radiation Oncology         [phone]) [phone] ________________________________  History and physical examination  Name: Joanne Griffin MRN: [id number]  Date: 12/03/2016  DOB: 05-21-1986   DIAGNOSIS: 32 year-old with invasive adenocarcinoma of the cervix, clinical stage IB2 adenocarcinoma cervix with radiographically suspicious pelvic lymphadenopathy and left supraclavicular lymphadenopathy  HISTORY OF PRESENT ILLNESS: Joanne Griffin is a 31 y.o. female who presented to the ER on 07/29/16 complaining of flank pain. She was treated for a UTI and had resolution of the flank pain although, she continued to have suprapubic pain. She has had irregular bleeding and a copious discharge for several months. Ultimately she had a Pap smear showing atypical glandular cells and a cervical biopsy on 09/01/16 revealed invasive adenocarcinoma.   PET scan revealed intense uptake in the cervix as well as upper pelvic nodal areas. In addition PET scan showed a left supraclavicular lymph node which was also palpable. The patient has recently completed her external beam radiation therapy now be taken to the operating room for placement of a tandem/ring in preparation for high-dose rate radiation therapy.   PREVIOUS RADIATION THERAPY: Yes , pelvis received 45 gray. A boost to the abnormal pelvic lymphadenopathy to 54 gray in upper pelvis region. The left supraclavicular region received 54 gray  PAST MEDICAL HISTORY:  has a past medical history of Anxiety; Cervical cancer [facility]) (oncologist-  dr gorsuch/  dr Sondra Come); Depression; History of cancer chemotherapy (11-09-2016 to 12-08-2016); History of radiation therapy (11-09-2016  to 12-22-2016); and Leukopenia due to antineoplastic chemotherapy ([place]).    PAST SURGICAL HISTORY: Past Surgical History:  Procedure Laterality Date  . CYST REMOVAL HAND  age 25  . IR FLUORO GUIDE PORT INSERTION RIGHT  10/29/2016  . IR US GUIDE VASC ACCESS RIGHT  10/29/2016  . WISDOM TOOTH EXTRACTION     times 4    FAMILY HISTORY: family history includes Breast cancer in her paternal grandmother; Diabetes in her maternal grandmother.  SOCIAL HISTORY:  reports that she has been smoking Cigarettes.  She has a 3.00 pack-year smoking history. She has never used smokeless tobacco. She reports that she does not drink alcohol or use drugs.  ALLERGIES: Amoxicillin and Penicillins  MEDICATIONS:  No current facility-administered medications for this encounter.    Current Outpatient Prescriptions  Medication Sig Dispense Refill  . buPROPion (WELLBUTRIN) 100 MG tablet Take 1 tablet (100 mg total) by mouth 2 (two) times daily. 60 tablet 1  . hyaluronate sodium (RADIAPLEXRX) GEL Apply 1 application topically 2 (two) times daily as needed.     Marland Kitchen LORazepam (ATIVAN) 0.5 MG tablet Take 1 tablet (0.5 mg total) by mouth every 8 (eight) hours. As Needed for anxiety or nausea 60 tablet 0  . morphine (MSIR) 15 MG tablet Take 1 tablet (15 mg total) by mouth every 6 (six) hours as needed for severe pain. 60 tablet 0  . ondansetron (ZOFRAN) 8 MG tablet Take 1 tablet (8 mg total) by mouth every 8 (eight) hours as needed. 60 tablet 3  . prochlorperazine (COMPAZINE) 10 MG tablet Take 1 tablet (10 mg total) by mouth every 6 (six) hours as needed (Nausea or vomiting). 60 tablet 3  . silver sulfADIAZINE (SILVADENE) 1 % cream Apply 1 application topically as needed.     Marland Kitchen levofloxacin (LEVAQUIN) 500 MG tablet Take 1 tablet (500 mg total) by mouth daily. (Patient taking differently: Take 500 mg by mouth as directed. Per pt as directed if gets fever and if feel like  getting a cold) 10 tablet 0    REVIEW OF SYSTEMS:  A 15 point review of systems is documented in the electronic medical record. This was obtained by the nursing staff. However, I reviewed this with the patient to discuss relevant findings and make appropriate changes.     PHYSICAL EXAM:   height is 5\' 2"  (1.575 m) and weight is 147 lb (66.7 kg). Her blood  pressure is 116/78 and her pulse is 92. Her oxygen saturation is 100%.   General: Alert and oriented, in no acute distress, accompanied by aunt and mother on evaluation today HEENT: Head is normocephalic. Extraocular movements are intact. Oropharynx is clear. Neck: Neck is supple. 1.5 cm lymph node in the left posterior triangle of the neck, mildly tender with palpation. Heart: Regular in rate and rhythm with no murmurs, rubs, or gallops. Chest: Clear to auscultation bilaterally, with no rhonchi, wheezes, or rales.  Abdomen: Soft, nontender, nondistended, with no rigidity or guarding. Extremities: No cyanosis or edema. Lymphatics: see Neck Exam Skin: No concerning lesions. Musculoskeletal: symmetric strength and muscle tone throughout. Neurologic: Cranial nerves II through XII are grossly intact. No obvious focalities. Speech is fluent. Coordination is intact. Psychiatric: Judgment and insight are intact. Affect is appropriate. EGBUS: Normal female Vagina: Normal, no lesions Cervix: Cervix is replaced by an exophytic nodular lesion which is slightly friable measuring approximately 4 cm in diameter. The lesion bleeds easily with exam. Bi-manual examination:    No obvious parametrial involvement  Rectal: normal sphincter tone, no masses, no blood, confirms bimanual exam    LABORATORY DATA:  Lab Results  Component Value Date   WBC 2.0 (L) 01/03/2017   HGB 10.9 (L) 01/03/2017   HCT 32.0 (L) 01/03/2017   MCV 89.2 01/03/2017   PLT 336 01/03/2017   NEUTROABS 1.3 (L) 01/03/2017   Lab Results  Component Value Date   NA 136 12/30/2016   K 3.9 12/30/2016   CL 106 12/30/2016   CO2 25 12/30/2016   GLUCOSE 119 (H) 12/30/2016   CREATININE 0.74 12/30/2016   CALCIUM 8.8 (L) 12/30/2016      RADIOGRAPHY: No results found.    IMPRESSION: Stage IV adenocarcinoma of the cervix by virtue of solitary left supraclavicular lymph node. The patient is being treated with curative intent has completed  external beam and radiosensitizing chemotherapy. She is now ready to proceed with brachytherapy directed at the cervical region. She will likely proceed with full dose chemotherapy after his she has completed her brachytherapy.  PLAN: Patient will be taken to the operating room on July 10 for her first high-dose-rate treatment. The patient will receive a total of 5 high-dose rate treatments using the tandem ring system.  Iridium 192 will be the high-dose-rate source.     ------------------------------------------------  Blair Promise, PhD, MD

## 2017-01-17 NOTE — Progress Notes (Signed)
IMMEDIATELY FOLLOWING SURGERY: Do not drive or operate machinery for the first twenty four hours after surgery. Do not make any important decisions for twenty four hours after surgery or while taking narcotic pain medications or sedatives. If you develop intractable nausea and vomiting or a severe headache please notify your doctor immediately.   FOLLOW-UP: You do not need to follow up with anesthesia unless specifically instructed to do so.   WOUND CARE INSTRUCTIONS (if applicable): Expect some mild vaginal bleeding, but if large amount of bleeding occurs please contact Dr. Sondra Come at [phone] or the Radiation On-Call physician. Call for any fever greater than 101.0 degrees or increasing vaginal//abdominal pain or trouble urinating.   QUESTIONS?: Please feel free to call your physician or the hospital operator if you have any questions, and they will be happy to assist you. Resume all medications: as listed on your after visit summary. Your next appointment is:  Future Appointments Date Time Provider [place]  01/24/2017 6:00 AM WL-US 1 WL-US [place]  01/24/2017 10:00 AM Gery Pray, MD [facility] None  01/24/2017 2:00 PM Gery Pray, MD [facility] None  01/26/2017 6:30 AM WL-US PORT 1 WL-US   01/26/2017 11:00 AM Gery Pray, MD [facility] None  01/26/2017 3:00 PM Gery Pray, MD [facility] None  01/28/2017 11:15 AM [facility] LAB 3 [facility] None  01/28/2017 11:30 AM [facility] B4 [facility] None  01/28/2017 12:15 PM Heath Lark, MD [facility] None  02/25/2017 1:15 PM [name], MD [facility] None

## 2017-01-17 NOTE — Addendum Note (Signed)
Encounter addended by: Jacqulyn Liner, RN on: 01/17/2017  5:05 PM<BR>    Actions taken: Sign clinical note

## 2017-01-17 NOTE — Addendum Note (Signed)
Encounter addended by: Jacqulyn Liner, RN on: 01/17/2017  5:03 PM<BR>    Actions taken: LDA properties accepted, Flowsheet accepted

## 2017-01-17 NOTE — Interval H&P Note (Signed)
History and Physical Interval Note:  01/17/2017 7:35 AM  Joanne Griffin  has presented today for surgery, with the diagnosis of CERVICAL CANCER  The various methods of treatment have been discussed with the patient and family. After consideration of risks, benefits and other options for treatment, the patient has consented to  Procedure(s): TANDEM RING INSERTION (N/A) as a surgical intervention .  The patient's history has been reviewed, patient examined, no change in status, stable for surgery.  I have reviewed the patient's chart and labs.  Questions were answered to the patient's satisfaction.     Gery Pray

## 2017-01-17 NOTE — Brief Op Note (Signed)
01/17/2017  8:57 AM  PATIENT:  Joanne Griffin  31 y.o. female  PRE-OPERATIVE DIAGNOSIS:  CERVICAL CANCER  POST-OPERATIVE DIAGNOSIS:  CERVICAL CANCER  PROCEDURE:  Procedure(s): TANDEM RING INSERTION (N/A)  SURGEON:  Surgeon(s) and Role:    * Gery Pray, MD - Primary  PHYSICIAN ASSISTANT:   ASSISTANTS: none   ANESTHESIA:   general  EBL:  Total I/O In: 700 [I.V.:700] Out: 10 [Blood:10]  BLOOD ADMINISTERED:none  DRAINS: Urinary Catheter (Foley)   LOCAL MEDICATIONS USED:  NONE  SPECIMEN:  No Specimen  DISPOSITION OF SPECIMEN:  N/A  COUNTS:  YES  TOURNIQUET:  * No tourniquets in log *  DICTATION: The patient was taken to the outpatient OR #1. A general anesthetic was applied. Timeout was performed for the length of the procedure,preoperative antibiotics (none) and allergies. The patient was prepped and draped in the usual sterile fashion and placed in the dorsolithotomy position. A minimal amount of Trendelenburg position was used during the procedure. . The uterus sounded to approximately 6 cm. The uterus was noted to be in moderate anteverted position. Patient had dilation of the cervix.  Patient then had placement of a 60 mmcervical sleeve. Excellent images were obtained with ultrasound. Patient then had a 60 mm 45 tandem placed within the cervical canaland inside the endometrial cavity. Good placement was noted. Patient then had a 45 ring with a small shielding cap placed within the upper vaginal vault. This was then followed by placement of a rectal paddle posteriorly. The equipmentwas affixed to each other securely. There was no room for vaginal packing. Intraoperative ultrasound at completion of the procedure showed continued good placement of the tandem within the uterine canal. Patient tolerated the procedure well. She will be transferred to radiationoncology for planning and her third high-dose-rate treatment. The patient is scheduled received 5 high-dose  treatments with iridium 192 as the high-dose-rate source.  PLAN OF CARE: Transferred to radiation oncology for planning and treatment  PATIENT DISPOSITION:  PACU - hemodynamically stable.   Delay start of Pharmacological VTE agent (>24hrs) due to surgical blood loss or risk of bleeding: not applicable

## 2017-01-17 NOTE — Progress Notes (Signed)
Patient reports having cramping pain at a 7/10 especially in her left side.  Foley catheter repositioned and 0.5 mg dilaudid IV given per Dr. Sondra Come.

## 2017-01-17 NOTE — Progress Notes (Signed)
Patient reports that her pain is better and is now rating it a 4/10.

## 2017-01-17 NOTE — Progress Notes (Signed)
Removed foley catheter intact. Removed Left Hand IV intact.  Pressure and band aid applied.  Patient given discharge instructions and paperwork.  Escorted her to the elevator with her family.

## 2017-01-17 NOTE — Transfer of Care (Signed)
  Last Vitals:  Vitals:   01/17/17 0622 01/17/17 0820  BP: 103/69 (!) (P) 140/91  Pulse: 94   Resp: 17   Temp: 36.5 C (P) 36.5 C    Last Pain:  Vitals:   01/17/17 0657  TempSrc:   PainSc: 1       Patients Stated Pain Goal: 5 (01/17/17 0813) Immediate Anesthesia Transfer of Care Note  Patient: Joanne Griffin  Procedure(s) Performed: Procedure(s) (LRB): TANDEM RING INSERTION (N/A)  Patient Location: PACU  Anesthesia Type: General  Level of Consciousness: awake, alert  and oriented  Airway & Oxygen Therapy: Patient Spontanous Breathing and Patient connected to nasal cannula oxygen  Post-op Assessment: Report given to PACU RN and Post -op Vital signs reviewed and stable  Post vital signs: Reviewed and stable  Complications: No apparent anesthesia complications

## 2017-01-17 NOTE — Addendum Note (Signed)
Encounter addended by: Gery Pray, MD on: 01/17/2017  3:33 PM<BR>    Actions taken: Diagnosis association updated, Order list changed

## 2017-01-17 NOTE — Anesthesia Preprocedure Evaluation (Addendum)
Anesthesia Evaluation  Patient identified by MRN, date of birth, ID band Patient awake, Patient confused and Patient unresponsive    Reviewed: Allergy & Precautions, NPO status , Patient's Chart, lab work & pertinent test results  Airway Mallampati: I  TM Distance: >3 FB Neck ROM: Full    Dental  (+) Teeth Intact, Dental Advisory Given, Chipped,  Some dental decay noted:   Pulmonary Current Smoker,    breath sounds clear to auscultation       Cardiovascular  Rhythm:Regular Rate:Normal     Neuro/Psych    GI/Hepatic negative GI ROS, Neg liver ROS,   Endo/Other  negative endocrine ROS  Renal/GU negative Renal ROS   adenoca of cervix     Musculoskeletal   Abdominal   Peds  Hematology  (+) Blood dyscrasia, anemia ,   Anesthesia Other Findings Patient will receive a series of procedures for weekly cervical radiation  Reproductive/Obstetrics                             Anesthesia Physical  Anesthesia Plan  ASA: II  Anesthesia Plan: General   Post-op Pain Management:    Induction: Intravenous  PONV Risk Score and Plan: 3 and Ondansetron, Dexamethasone, Propofol and Midazolam  Airway Management Planned: LMA  Additional Equipment:   Intra-op Plan:   Post-operative Plan: Extubation in OR  Informed Consent: I have reviewed the patients History and Physical, chart, labs and discussed the procedure including the risks, benefits and alternatives for the proposed anesthesia with the patient or authorized representative who has indicated his/her understanding and acceptance.     Plan Discussed with:   Anesthesia Plan Comments:         Anesthesia Quick Evaluation

## 2017-01-17 NOTE — Progress Notes (Signed)
  Radiation Oncology         [phone] ________________________________  Name: Joanne Griffin MRN: [id number]  Date: 01/17/2017  DOB: Sep 22, 1985  SIMULATION AND TREATMENT PLANNING NOTE HDR BRACHYTHERAPY  DIAGNOSIS:  31 year-old with invasive adenocarcinoma of the cervix, clinical stage IB2 adenocarcinoma cervix with radiographically suspicious pelvic lymphadenopathy.  NARRATIVE:  The patient was brought to the [place].  Identity was confirmed.  All relevant records and images related to the planned course of therapy were reviewed.  The patient freely provided informed written consent to proceed with treatment after reviewing the details related to the planned course of therapy. The consent form was witnessed and verified by the simulation staff.  Then, the patient was set-up in a stable reproducible  supine position for radiation therapy.  CT images were obtained.  Surface markings were placed.  The CT images were loaded into the planning software.  Then the target and avoidance structures were contoured.  Treatment planning then occurred.  The radiation prescription was entered and confirmed.   I have requested : Brachytherapy Isodose Plan and Dosimetry Calculations to plan the radiation distribution.    PLAN:  The patient will receive 5.5 Gy in 1 fraction.  ________________________________   Blair Promise, PhD, MD This document serves as a record of services personally performed by Gery Pray, MD. It was created on his behalf by Valeta Harms, a trained medical scribe. The creation of this record is based on the scribe's personal observations and the provider's statements to them. This document has been checked and approved by the attending provider.

## 2017-01-17 NOTE — Anesthesia Postprocedure Evaluation (Signed)
Anesthesia Post Note  Patient: [place]  Procedure(s) Performed: Procedure(s) (LRB): TANDEM RING INSERTION (N/A)     Patient location during evaluation: PACU Anesthesia Type: General Level of consciousness: awake and alert Pain management: pain level controlled Vital Signs Assessment: post-procedure vital signs reviewed and stable Respiratory status: spontaneous breathing, nonlabored ventilation, respiratory function stable and patient connected to nasal cannula oxygen Cardiovascular status: blood pressure returned to baseline and stable Postop Assessment: no signs of nausea or vomiting Anesthetic complications: no    Last Vitals:  Vitals:   01/17/17 0900 01/17/17 0915  BP: 136/85 131/88  Pulse: 72 70  Resp: (!) 9 16  Temp:      Last Pain:  Vitals:   01/17/17 0915  TempSrc:   PainSc: [place]

## 2017-01-18 ENCOUNTER — Encounter (HOSPITAL_BASED_OUTPATIENT_CLINIC_OR_DEPARTMENT_OTHER): Payer: Self-pay | Admitting: Radiation Oncology

## 2017-01-20 ENCOUNTER — Encounter (HOSPITAL_BASED_OUTPATIENT_CLINIC_OR_DEPARTMENT_OTHER): Payer: Self-pay | Admitting: *Deleted

## 2017-01-20 ENCOUNTER — Ambulatory Visit: Payer: Medicaid Other | Admitting: Radiation Oncology

## 2017-01-20 ENCOUNTER — Ambulatory Visit (HOSPITAL_COMMUNITY): Payer: Medicaid Other

## 2017-01-20 NOTE — Progress Notes (Signed)
SPOKE W/ PT TODAY VIA PHONE ABOUT THE LAST TWO PROCEDURES 01-24-2017 AND 01-26-2017.  PT VERBALIZED UNDERSTANDING NPO AFTER MN FOR BOTH PROCEDURES AND ARRIVE AT 0600 ON 01-24-2017 AND 0700 ON 01-26-2017.  CURRENT LAB RESULTS IN CHART AND EPIC.  !!PLEASE BE CAREFUL RELEASING ORDERS , DR Sondra Come HAS PUT IN ORDERS FOR BOTH PROCEDURES 01-24-2017 AND 01-26-2017 !!!

## 2017-01-24 ENCOUNTER — Encounter (HOSPITAL_BASED_OUTPATIENT_CLINIC_OR_DEPARTMENT_OTHER): Payer: Self-pay | Admitting: *Deleted

## 2017-01-24 ENCOUNTER — Ambulatory Visit (HOSPITAL_COMMUNITY)
Admission: RE | Admit: 2017-01-24 | Discharge: 2017-01-24 | Disposition: A | Discharge status: Home / Self Care | Payer: Medicaid Other | Source: Ambulatory Visit | Attending: Radiation Oncology | Admitting: Radiation Oncology

## 2017-01-24 ENCOUNTER — Ambulatory Visit (HOSPITAL_BASED_OUTPATIENT_CLINIC_OR_DEPARTMENT_OTHER)
Admission: RE | Admit: 2017-01-24 | Discharge: 2017-01-24 | Disposition: A | Discharge status: Home / Self Care | Payer: Medicaid Other | Source: Ambulatory Visit | Attending: Radiation Oncology | Admitting: Radiation Oncology

## 2017-01-24 ENCOUNTER — Ambulatory Visit (HOSPITAL_BASED_OUTPATIENT_CLINIC_OR_DEPARTMENT_OTHER): Payer: Medicaid Other | Admitting: Anesthesiology

## 2017-01-24 ENCOUNTER — Ambulatory Visit
Admission: RE | Admit: 2017-01-24 | Discharge: 2017-01-24 | Disposition: A | Discharge status: Home / Self Care | Payer: Medicaid Other | Source: Ambulatory Visit | Attending: Radiation Oncology | Admitting: Radiation Oncology

## 2017-01-24 ENCOUNTER — Encounter (HOSPITAL_BASED_OUTPATIENT_CLINIC_OR_DEPARTMENT_OTHER)
Admission: RE | Disposition: A | Discharge status: Home / Self Care | Payer: Self-pay | Source: Ambulatory Visit | Attending: Radiation Oncology

## 2017-01-24 VITALS — BP 105/59 | HR 77

## 2017-01-24 DIAGNOSIS — C539 Malignant neoplasm of cervix uteri, unspecified: Secondary | ICD-10-CM | POA: Diagnosis present

## 2017-01-24 DIAGNOSIS — F17210 Nicotine dependence, cigarettes, uncomplicated: Secondary | ICD-10-CM | POA: Insufficient documentation

## 2017-01-24 DIAGNOSIS — D701 Agranulocytosis secondary to cancer chemotherapy: Secondary | ICD-10-CM | POA: Diagnosis not present

## 2017-01-24 DIAGNOSIS — Z79899 Other long term (current) drug therapy: Secondary | ICD-10-CM | POA: Insufficient documentation

## 2017-01-24 DIAGNOSIS — C530 Malignant neoplasm of endocervix: Secondary | ICD-10-CM

## 2017-01-24 DIAGNOSIS — Z880 Allergy status to penicillin: Secondary | ICD-10-CM | POA: Diagnosis not present

## 2017-01-24 DIAGNOSIS — F329 Major depressive disorder, single episode, unspecified: Secondary | ICD-10-CM | POA: Diagnosis not present

## 2017-01-24 DIAGNOSIS — F419 Anxiety disorder, unspecified: Secondary | ICD-10-CM | POA: Insufficient documentation

## 2017-01-24 HISTORY — PX: TANDEM RING INSERTION: SHX6199

## 2017-01-24 SURGERY — INSERTION, UTERINE TANDEM AND RING OR CYLINDER, FOR BRACHYTHERAPY
Anesthesia: General | Site: Uterus

## 2017-01-24 MED ORDER — LACTATED RINGERS IV SOLN
INTRAVENOUS | Status: DC
  Administered 2017-01-24 (×2): via INTRAVENOUS
  Filled 2017-01-24 (×2): qty 1000

## 2017-01-24 MED ORDER — HYDROMORPHONE HCL 1 MG/ML IJ SOLN
INTRAMUSCULAR | Status: AC
Start: 2017-01-24 — End: 2017-01-24
  Filled 2017-01-24: qty 1

## 2017-01-24 MED ORDER — MIDAZOLAM HCL 5 MG/5ML IJ SOLN
INTRAMUSCULAR | Status: DC | PRN
  Administered 2017-01-24: 2 mg via INTRAVENOUS

## 2017-01-24 MED ORDER — HYDROMORPHONE HCL-NACL 0.5-0.9 MG/ML-% IV SOSY
PREFILLED_SYRINGE | INTRAVENOUS | Status: AC
  Filled 2017-01-24: qty 1

## 2017-01-24 MED ORDER — MEPERIDINE HCL 25 MG/ML IJ SOLN
6.2500 mg | INTRAMUSCULAR | Status: DC | PRN
  Filled 2017-01-24: qty 1

## 2017-01-24 MED ORDER — DEXAMETHASONE SODIUM PHOSPHATE 10 MG/ML IJ SOLN
INTRAMUSCULAR | Status: AC
Start: 2017-01-24 — End: 2017-01-24
  Filled 2017-01-24: qty 1

## 2017-01-24 MED ORDER — PROPOFOL 10 MG/ML IV BOLUS
INTRAVENOUS | Status: AC
  Filled 2017-01-24: qty 40

## 2017-01-24 MED ORDER — MIDAZOLAM HCL 2 MG/2ML IJ SOLN
INTRAMUSCULAR | Status: AC
  Filled 2017-01-24: qty 2

## 2017-01-24 MED ORDER — HYDROMORPHONE HCL 1 MG/ML IJ SOLN
0.5000 mg | Freq: Once | INTRAMUSCULAR | Status: AC
  Administered 2017-01-24: 0.5 mg via INTRAVENOUS
  Filled 2017-01-24: qty 1

## 2017-01-24 MED ORDER — MIDAZOLAM HCL 2 MG/2ML IJ SOLN
0.5000 mg | Freq: Once | INTRAMUSCULAR | Status: DC | PRN
  Filled 2017-01-24: qty 2

## 2017-01-24 MED ORDER — HYDROMORPHONE HCL 1 MG/ML IJ SOLN
1.0000 mg | Freq: Once | INTRAMUSCULAR | Status: AC
  Administered 2017-01-24: 1 mg via INTRAVENOUS
  Filled 2017-01-24: qty 1

## 2017-01-24 MED ORDER — LIDOCAINE 2% (20 MG/ML) 5 ML SYRINGE
INTRAMUSCULAR | Status: AC
  Filled 2017-01-24: qty 5

## 2017-01-24 MED ORDER — WATER FOR IRRIGATION, STERILE IR SOLN
Status: DC | PRN
  Administered 2017-01-24: 250 mL via INTRAVESICAL

## 2017-01-24 MED ORDER — KETOROLAC TROMETHAMINE 30 MG/ML IJ SOLN
INTRAMUSCULAR | Status: DC | PRN
  Administered 2017-01-24: 30 mg via INTRAVENOUS

## 2017-01-24 MED ORDER — HYDROMORPHONE HCL 1 MG/ML IJ SOLN
0.2500 mg | INTRAMUSCULAR | Status: DC | PRN
  Administered 2017-01-24 (×3): 0.25 mg via INTRAVENOUS
  Filled 2017-01-24: qty 0.5

## 2017-01-24 MED ORDER — FENTANYL CITRATE (PF) 100 MCG/2ML IJ SOLN
25.0000 ug | INTRAMUSCULAR | Status: DC | PRN
  Filled 2017-01-24: qty 1

## 2017-01-24 MED ORDER — PROMETHAZINE HCL 25 MG/ML IJ SOLN
6.2500 mg | INTRAMUSCULAR | Status: DC | PRN
  Filled 2017-01-24: qty 1

## 2017-01-24 MED ORDER — LACTATED RINGERS IV SOLN
INTRAVENOUS | Status: DC
  Administered 2017-01-24: 10:00:00 via INTRAVENOUS
  Filled 2017-01-24 (×2): qty 250

## 2017-01-24 MED ORDER — PROPOFOL 10 MG/ML IV BOLUS
INTRAVENOUS | Status: DC | PRN
  Administered 2017-01-24: 150 mg via INTRAVENOUS
  Administered 2017-01-24: 50 mg via INTRAVENOUS

## 2017-01-24 MED ORDER — FENTANYL CITRATE (PF) 100 MCG/2ML IJ SOLN
INTRAMUSCULAR | Status: DC | PRN
  Administered 2017-01-24: 25 ug via INTRAVENOUS
  Administered 2017-01-24: 50 ug via INTRAVENOUS

## 2017-01-24 MED ORDER — SUCCINYLCHOLINE CHLORIDE 200 MG/10ML IV SOSY
PREFILLED_SYRINGE | INTRAVENOUS | Status: AC
  Filled 2017-01-24: qty 10

## 2017-01-24 MED ORDER — DIPHENHYDRAMINE HCL 25 MG PO TABS
25.0000 mg | ORAL_TABLET | Freq: Once | ORAL | Status: AC
  Administered 2017-01-24: 25 mg via ORAL
  Filled 2017-01-24: qty 1

## 2017-01-24 MED ORDER — HYDROMORPHONE HCL 1 MG/ML IJ SOLN
INTRAMUSCULAR | Status: AC
  Filled 2017-01-24: qty 1

## 2017-01-24 MED ORDER — SUCCINYLCHOLINE CHLORIDE 200 MG/10ML IV SOSY
PREFILLED_SYRINGE | INTRAVENOUS | Status: DC | PRN
  Administered 2017-01-24: 100 mg via INTRAVENOUS

## 2017-01-24 MED ORDER — ONDANSETRON HCL 4 MG/2ML IJ SOLN
INTRAMUSCULAR | Status: AC
  Filled 2017-01-24: qty 2

## 2017-01-24 MED ORDER — DEXAMETHASONE SODIUM PHOSPHATE 10 MG/ML IJ SOLN
INTRAMUSCULAR | Status: DC | PRN
  Administered 2017-01-24: 10 mg via INTRAVENOUS

## 2017-01-24 MED ORDER — DIPHENHYDRAMINE HCL 25 MG PO CAPS
ORAL_CAPSULE | ORAL | Status: AC
  Filled 2017-01-24: qty 1

## 2017-01-24 MED ORDER — ONDANSETRON HCL 4 MG/2ML IJ SOLN
INTRAMUSCULAR | Status: DC | PRN
  Administered 2017-01-24: 4 mg via INTRAVENOUS

## 2017-01-24 MED ORDER — FENTANYL CITRATE (PF) 100 MCG/2ML IJ SOLN
INTRAMUSCULAR | Status: AC
  Filled 2017-01-24: qty 2

## 2017-01-24 MED ORDER — KETOROLAC TROMETHAMINE 30 MG/ML IJ SOLN
INTRAMUSCULAR | Status: AC
  Filled 2017-01-24: qty 1

## 2017-01-24 SURGICAL SUPPLY — 28 items
BAG URINE DRAINAGE (UROLOGICAL SUPPLIES) ×3 IMPLANT
BNDG CONFORM 2 STRL LF (GAUZE/BANDAGES/DRESSINGS) IMPLANT
CATH FOLEY 2WAY SLVR  5CC 16FR (CATHETERS) ×2
CATH FOLEY 2WAY SLVR 5CC 16FR (CATHETERS) ×1 IMPLANT
GLOVE BIO SURGEON STRL SZ7.5 (GLOVE) ×6 IMPLANT
GOWN STRL REUS W/ TWL LRG LVL3 (GOWN DISPOSABLE) ×2 IMPLANT
GOWN STRL REUS W/TWL LRG LVL3 (GOWN DISPOSABLE) ×6
HOLDER FOLEY CATH W/STRAP (MISCELLANEOUS) ×3 IMPLANT
KIT RM TURNOVER CYSTO AR (KITS) ×3 IMPLANT
NDL SPNL 22GX3.5 QUINCKE BK (NEEDLE) IMPLANT
NEEDLE SPNL 22GX3.5 QUINCKE BK (NEEDLE) ×3 IMPLANT
PACK VAGINAL MINOR WOMEN LF (CUSTOM PROCEDURE TRAY) ×3 IMPLANT
PACKING VAGINAL (PACKING) IMPLANT
PAD ABD 8X10 STRL (GAUZE/BANDAGES/DRESSINGS) ×3 IMPLANT
PAD OB MATERNITY 4.3X12.25 (PERSONAL CARE ITEMS) IMPLANT
PLUG CATH AND CAP STER (CATHETERS) IMPLANT
SET IRRIG Y TYPE TUR BLADDER L (SET/KITS/TRAYS/PACK) ×3 IMPLANT
SUT PROLENE 0 SH 30 (SUTURE) IMPLANT
SUT SILK 2 0 30  PSL (SUTURE)
SUT SILK 2 0 30 PSL (SUTURE) IMPLANT
SYR BULB IRRIGATION 50ML (SYRINGE) IMPLANT
SYR CONTROL 10ML LL (SYRINGE) IMPLANT
SYRINGE 10CC LL (SYRINGE) ×3 IMPLANT
TOWEL OR 17X24 6PK STRL BLUE (TOWEL DISPOSABLE) ×6 IMPLANT
TUBE CONNECTING 12'X1/4 (SUCTIONS)
TUBE CONNECTING 12X1/4 (SUCTIONS) IMPLANT
WATER STERILE IRR 3000ML UROMA (IV SOLUTION) ×3 IMPLANT
WATER STERILE IRR 500ML POUR (IV SOLUTION) ×3 IMPLANT

## 2017-01-24 NOTE — Anesthesia Postprocedure Evaluation (Signed)
Anesthesia Post Note  Patient: Joanne Griffin  Procedure(s) Performed: Procedure(s) (LRB): TANDEM RING INSERTION (N/A)     Patient location during evaluation: PACU Anesthesia Type: General Level of consciousness: awake and alert, patient cooperative and oriented Pain management: pain level controlled Vital Signs Assessment: post-procedure vital signs reviewed and stable Respiratory status: spontaneous breathing, nonlabored ventilation and respiratory function stable Cardiovascular status: blood pressure returned to baseline and stable Postop Assessment: no signs of nausea or vomiting Anesthetic complications: no    Last Vitals:  Vitals:   01/24/17 0830 01/24/17 0845  BP: 135/77 121/85  Pulse: 72 70  Resp: 15 17  Temp:  36.8 C    Last Pain:  Vitals:   01/24/17 0845  TempSrc:   PainSc: 4                  Joanne Griffin,Joanne Griffin

## 2017-01-24 NOTE — Interval H&P Note (Signed)
History and Physical Interval Note:  01/24/2017 7:35 AM  Joanne Griffin  has presented today for surgery, with the diagnosis of CERVICAL CANCER  The various methods of treatment have been discussed with the patient and family. After consideration of risks, benefits and other options for treatment, the patient has consented to  Procedure(s): TANDEM RING INSERTION (N/A) as a surgical intervention .  The patient's history has been reviewed, patient examined, no change in status, stable for surgery.  I have reviewed the patient's chart and labs.  Questions were answered to the patient's satisfaction.     Gery Pray

## 2017-01-24 NOTE — Anesthesia Procedure Notes (Signed)
Procedure Name: Intubation Date/Time: 01/24/2017 7:33 AM Performed by: Bethena Roys T Pre-anesthesia Checklist: Patient identified, Emergency Drugs available, Suction available and Patient being monitored Patient Re-evaluated:Patient Re-evaluated prior to induction Oxygen Delivery Method: Circle system utilized Preoxygenation: Pre-oxygenation with 100% oxygen Induction Type: IV induction Ventilation: Mask ventilation without difficulty Laryngoscope Size: Mac and 3 Grade View: Grade I Tube type: Oral Tube size: 7.0 mm Number of attempts: 1 Airway Equipment and Method: Stylet and Oral airway Placement Confirmation: ETT inserted through vocal cords under direct vision,  positive ETCO2 and breath sounds checked- equal and bilateral Secured at: 21 cm Tube secured with: Tape Dental Injury: Teeth and Oropharynx as per pre-operative assessment

## 2017-01-24 NOTE — Anesthesia Preprocedure Evaluation (Addendum)
Anesthesia Evaluation  Patient identified by MRN, date of birth, ID band Patient awake    Reviewed: Allergy & Precautions, NPO status , Patient's Chart, lab work & pertinent test results  History of Anesthesia Complications Negative for: history of anesthetic complications  Airway Mallampati: II  TM Distance: >3 FB Neck ROM: Full    Dental  (+) Chipped, Dental Advisory Given   Pulmonary Current Smoker,    breath sounds clear to auscultation       Cardiovascular negative cardio ROS   Rhythm:Regular Rate:Normal     Neuro/Psych negative neurological ROS     GI/Hepatic Neg liver ROS, neg GERD  ,Nausea and vomiting this morning (chemo)   Endo/Other  negative endocrine ROS  Renal/GU negative Renal ROS   Cervical cancer: chemo, XRT    Musculoskeletal negative musculoskeletal ROS (+)   Abdominal   Peds  Hematology negative hematology ROS (+)   Anesthesia Other Findings   Reproductive/Obstetrics                            Anesthesia Physical Anesthesia Plan  ASA: III  Anesthesia Plan: General   Post-op Pain Management:    Induction: Intravenous  PONV Risk Score and Plan: 2 and Ondansetron and Dexamethasone  Airway Management Planned: Oral ETT  Additional Equipment:   Intra-op Plan:   Post-operative Plan: Extubation in OR  Informed Consent: I have reviewed the patients History and Physical, chart, labs and discussed the procedure including the risks, benefits and alternatives for the proposed anesthesia with the patient or authorized representative who has indicated his/her understanding and acceptance.   Dental advisory given  Plan Discussed with: CRNA and Surgeon  Anesthesia Plan Comments: (Plan routine monitors, GETA)        Anesthesia Quick Evaluation

## 2017-01-24 NOTE — Progress Notes (Signed)
IMMEDIATELY FOLLOWING SURGERY: Do not drive or operate machinery for the first twenty four hours after surgery. Do not make any important decisions for twenty four hours after surgery or while taking narcotic pain medications or sedatives. If you develop intractable nausea and vomiting or a severe headache please notify your doctor immediately.   FOLLOW-UP: You do not need to follow up with anesthesia unless specifically instructed to do so.   WOUND CARE INSTRUCTIONS (if applicable): Expect some mild vaginal bleeding, but if large amount of bleeding occurs please contact Dr. Sondra Come at [phone] or the Radiation On-Call physician. Call for any fever greater than 101.0 degrees or increasing vaginal//abdominal pain or trouble urinating.   QUESTIONS?: Please feel free to call your physician or the hospital operator if you have any questions, and they will be happy to assist you. Resume all medications: as listed on your after visit summary. Your next appointment is:  Future Appointments Date Time Provider [place]  01/24/2017 2:00 PM Gery Pray, MD [facility] None  01/26/2017 6:30 AM WL-US PORT 1 WL-US [place]  01/26/2017 11:00 AM Gery Pray, MD [facility] None  01/26/2017 3:00 PM Gery Pray, MD [facility] None  01/28/2017 11:15 AM [facility] LAB 3 [facility] None  01/28/2017 11:30 AM [facility] G22 [facility] None  01/28/2017 12:15 PM Heath Lark, MD [facility] None  02/25/2017 1:15 PM [name], MD [facility] None

## 2017-01-24 NOTE — Progress Notes (Signed)
Patient reports having generalized itching.  She was given benadryl 25 mg PO per Dr. Sondra Come.

## 2017-01-24 NOTE — H&P (View-Only) (Signed)
Radiation Oncology         [phone] ________________________________  History and physical examination  Name: Joanne Griffin MRN: [id number]  Date: 12/03/2016  DOB: Aug 04, 1985   DIAGNOSIS: 31 year-old with invasive adenocarcinoma of the cervix, clinical stage IB2 adenocarcinoma cervix with radiographically suspicious pelvic lymphadenopathy and left supraclavicular lymphadenopathy  HISTORY OF PRESENT ILLNESS: Joanne Griffin is a 31 y.o. female who presented to the ER on 07/29/16 complaining of flank pain. She was treated for a UTI and had resolution of the flank pain although, she continued to have suprapubic pain. She has had irregular bleeding and a copious discharge for several months. Ultimately she had a Pap smear showing atypical glandular cells and a cervical biopsy on 09/01/16 revealed invasive adenocarcinoma.   PET scan revealed intense uptake in the cervix as well as upper pelvic nodal areas. In addition PET scan showed a left supraclavicular lymph node which was also palpable. The patient has recently completed her external beam radiation therapy now be taken to the operating room for placement of a tandem/ring in preparation for high-dose rate radiation therapy.   PREVIOUS RADIATION THERAPY: Yes , pelvis received 45 gray. A boost to the abnormal pelvic lymphadenopathy to 54 gray in upper pelvis region. The left supraclavicular region received 54 gray  PAST MEDICAL HISTORY:  has a past medical history of Anxiety; Cervical cancer [facility]) (oncologist-  dr gorsuch/  dr Sondra Come); Depression; History of cancer chemotherapy (11-09-2016 to 12-08-2016); History of radiation therapy (11-09-2016  to 12-22-2016); and Leukopenia due to antineoplastic chemotherapy ([place]).    PAST SURGICAL HISTORY: Past Surgical History:  Procedure Laterality Date  . CYST REMOVAL HAND  age 32  . IR FLUORO GUIDE PORT INSERTION RIGHT  10/29/2016  . IR US GUIDE VASC ACCESS RIGHT  10/29/2016  . WISDOM TOOTH EXTRACTION     times 4    FAMILY HISTORY: family history includes Breast cancer in her paternal grandmother; Diabetes in her maternal grandmother.  SOCIAL HISTORY:  reports that she has been smoking Cigarettes.  She has a 3.00 pack-year smoking history. She has never used smokeless tobacco. She reports that she does not drink alcohol or use drugs.  ALLERGIES: Amoxicillin and Penicillins  MEDICATIONS:  No current facility-administered medications for this encounter.    Current Outpatient Prescriptions  Medication Sig Dispense Refill  . buPROPion (WELLBUTRIN) 100 MG tablet Take 1 tablet (100 mg total) by mouth 2 (two) times daily. 60 tablet 1  . hyaluronate sodium (RADIAPLEXRX) GEL Apply 1 application topically 2 (two) times daily as needed.     Marland Kitchen LORazepam (ATIVAN) 0.5 MG tablet Take 1 tablet (0.5 mg total) by mouth every 8 (eight) hours. As Needed for anxiety or nausea 60 tablet 0  . morphine (MSIR) 15 MG tablet Take 1 tablet (15 mg total) by mouth every 6 (six) hours as needed for severe pain. 60 tablet 0  . ondansetron (ZOFRAN) 8 MG tablet Take 1 tablet (8 mg total) by mouth every 8 (eight) hours as needed. 60 tablet 3  . prochlorperazine (COMPAZINE) 10 MG tablet Take 1 tablet (10 mg total) by mouth every 6 (six) hours as needed (Nausea or vomiting). 60 tablet 3  . silver sulfADIAZINE (SILVADENE) 1 % cream Apply 1 application topically as needed.     Marland Kitchen levofloxacin (LEVAQUIN) 500 MG tablet Take 1 tablet (500 mg total) by mouth daily. (Patient taking differently: Take 500 mg by mouth as directed. Per pt as directed if gets fever and if feel like  getting a cold) 10 tablet 0    REVIEW OF SYSTEMS:  A 15 point review of systems is documented in the electronic medical record. This was obtained by the nursing staff. However, I reviewed this with the patient to discuss relevant findings and make appropriate changes.     PHYSICAL EXAM:   height is 5\' 2"  (1.575 m) and weight is 147 lb (66.7 kg). Her blood  pressure is 116/78 and her pulse is 92. Her oxygen saturation is 100%.   General: Alert and oriented, in no acute distress, accompanied by aunt and mother on evaluation today HEENT: Head is normocephalic. Extraocular movements are intact. Oropharynx is clear. Neck: Neck is supple. 1.5 cm lymph node in the left posterior triangle of the neck, mildly tender with palpation. Heart: Regular in rate and rhythm with no murmurs, rubs, or gallops. Chest: Clear to auscultation bilaterally, with no rhonchi, wheezes, or rales.  Abdomen: Soft, nontender, nondistended, with no rigidity or guarding. Extremities: No cyanosis or edema. Lymphatics: see Neck Exam Skin: No concerning lesions. Musculoskeletal: symmetric strength and muscle tone throughout. Neurologic: Cranial nerves II through XII are grossly intact. No obvious focalities. Speech is fluent. Coordination is intact. Psychiatric: Judgment and insight are intact. Affect is appropriate. EGBUS: Normal female Vagina: Normal, no lesions Cervix: Cervix is replaced by an exophytic nodular lesion which is slightly friable measuring approximately 4 cm in diameter. The lesion bleeds easily with exam. Bi-manual examination:    No obvious parametrial involvement  Rectal: normal sphincter tone, no masses, no blood, confirms bimanual exam    LABORATORY DATA:  Lab Results  Component Value Date   WBC 2.0 (L) 01/03/2017   HGB 10.9 (L) 01/03/2017   HCT 32.0 (L) 01/03/2017   MCV 89.2 01/03/2017   PLT 336 01/03/2017   NEUTROABS 1.3 (L) 01/03/2017   Lab Results  Component Value Date   NA 136 12/30/2016   K 3.9 12/30/2016   CL 106 12/30/2016   CO2 25 12/30/2016   GLUCOSE 119 (H) 12/30/2016   CREATININE 0.74 12/30/2016   CALCIUM 8.8 (L) 12/30/2016      RADIOGRAPHY: No results found.    IMPRESSION: Stage IV adenocarcinoma of the cervix by virtue of solitary left supraclavicular lymph node. The patient is being treated with curative intent has completed  external beam and radiosensitizing chemotherapy. She is now ready to proceed with brachytherapy directed at the cervical region. She will likely proceed with full dose chemotherapy after his she has completed her brachytherapy.  PLAN: Patient will be taken to the operating room on July 10 for her first high-dose-rate treatment. The patient will receive a total of 5 high-dose rate treatments using the tandem ring system.  Iridium 192 will be the high-dose-rate source.     ------------------------------------------------  Blair Promise, PhD, MD

## 2017-01-24 NOTE — Progress Notes (Signed)
  Radiation Oncology         [phone]) [phone] ________________________________  Name: Joanne Griffin MRN: [id number]  Date: 01/24/2017  DOB: 05-21-1986  SIMULATION AND TREATMENT PLANNING NOTE HDR BRACHYTHERAPY  DIAGNOSIS:  31 y.o.  woman with invasive adenocarcinoma of the cervix, clinical stage IB2 adenocarcinoma cervix with radiographically suspicious pelvic lymphadenopathy and left supraclavicular lymphadenopathy.  NARRATIVE:  The patient was brought to the [place].  Identity was confirmed.  All relevant records and images related to the planned course of therapy were reviewed.  The patient freely provided informed written consent to proceed with treatment after reviewing the details related to the planned course of therapy. The consent form was witnessed and verified by the simulation staff.  Then, the patient was set-up in a stable reproducible  supine position for radiation therapy.  CT images were obtained.  Surface markings were placed.  The CT images were loaded into the planning software.  Then the target and avoidance structures were contoured.  Treatment planning then occurred.  The radiation prescription was entered and confirmed.   I have requested : Brachytherapy Isodose Plan and Dosimetry Calculations to plan the radiation distribution.    PLAN:  The patient will receive 5.5 Gy in 1 fraction. Patient will be treated with a tandem ring system using iridium 192 as the high-dose-rate source    ________________________________  -----------------------------------  Blair Promise, PhD, MD  This document serves as a record of services personally performed by Gery Pray MD. It was created on his behalf by Delton Coombes, a trained medical scribe. The creation of this record is based on the scribe's personal observations and the provider's statements to them. This document has been checked and approved by the attending provider.

## 2017-01-24 NOTE — Progress Notes (Signed)
Removed foley catheter intact.  Removed right hand IV intact.  Pressure and band aid applied.  Patient given discharge paperwork and instructions.  She was escorted to the first floor lobby to meet her family.

## 2017-01-24 NOTE — Progress Notes (Signed)
  Radiation Oncology         [phone]) [phone] ________________________________  Name: Joanne Griffin MRN: [id number]  Date: 01/24/2017  DOB: [birth year]  CC: Patient, No Pcp Per  [name],*  HDR BRACHYTHERAPY NOTE  DIAGNOSIS: 31 y.o. woman with invasive adenocarcinoma of the cervix, clinical stage IB2 adenocarcinoma cervix with radiographically suspicious pelvic lymphadenopathy and left supraclavicular lymphadenopathy.  NARRATIVE: The patient was brought to the HDR suite. Identity was confirmed. All relevant records and images related to the planned course of therapy were reviewed. The patient freely provided informed written consent to proceed with treatment after reviewing the details related to the planned course of therapy. The consent form was witnessed and verified by the simulation staff. Then, the patient was set-up in a stable reproducible supine position for radiation therapy. The tandem ring system was accessed and fiducial markers were placed within the tandem and ring.   Simple treatment device note: On the operating room the patient had construction of her custom tandem ring system. She will be treated with a 45 tandem/ring system. The patient had placement of a 16 mm tandem. A cervical ring with a small shielding was used for her treatment. A rectal paddle was also part of her custom set up device.  Verification simulation note: An AP and lateral film was obtained through the pelvis area. This was compared to the patient's planning films documenting accurate position of the tandem/ring system for treatment.  High-dose-rate brachytherapy treatment note:  The remote afterloading device was accessed through catheter system and attached to the tandem ring system. Patient then proceeded to undergo her 4th high-dose-rate treatment directed at the cervix. The patient was prescribed a dose of 5.5 gray to be delivered to the mucosal surface.. Patient was treated with 2 channels  using 23 dwell positions. Treatment time was 344.7 seconds. The patient tolerated the procedure well. After completion of her therapy, a radiation survey was performed documenting return of the iridium source into the [employer] safe. The patient was then transferred to the nursing suite. She then had removal of the rectal paddle followed by the tandem and ring system. The patient tolerated the removal well.  PLAN: She will come Wednesday, 01/26/17 for her 5th and final treatment. ________________________________  -----------------------------------  Blair Promise, PhD, MD  This document serves as a record of services personally performed by Gery Pray MD. It was created on his behalf by Delton Coombes, a trained medical scribe. The creation of this record is based on the scribe's personal observations and the provider's statements to them. This document has been checked and approved by the attending provider.

## 2017-01-24 NOTE — Brief Op Note (Signed)
01/24/2017  8:41 AM  PATIENT:  Joanne Griffin  31 y.o. female  PRE-OPERATIVE DIAGNOSIS:  CERVICAL CANCER  POST-OPERATIVE DIAGNOSIS:  CERVICAL CANCER  PROCEDURE:  Procedure(s): TANDEM RING INSERTION (N/A)  SURGEON:  Surgeon(s) and Role:    * Gery Pray, MD - Primary  PHYSICIAN ASSISTANT:   ASSISTANTS: none   ANESTHESIA:   general  EBL:  Total I/O In: 550 [I.V.:550] Out: 200 [Urine:200]  BLOOD ADMINISTERED:none  DRAINS: Urinary Catheter (Foley)   LOCAL MEDICATIONS USED:  NONE  SPECIMEN:  No Specimen  DISPOSITION OF SPECIMEN:  N/A  COUNTS:  YES  TOURNIQUET:  * No tourniquets in log *  DICTATION: The patient was taken to the outpatient OR #3. A general anesthetic was applied. Timeout was performed for the length of the procedure,preoperative antibiotics (none) and allergies. The patient was prepped and draped in the usual sterile fashion and placed in the dorsolithotomy position. A Foley catheter was placed without difficulty. The bladder was backfilled with approximately 250 mL of sterile water for ultrasound imaging purposes. A minimal amount of Trendelenburg position was used during the procedure.. The uterus sounded to approximately 6 cm. The uterus was noted to be in moderate anteverted position. Patient had dilation of the cervix. Patient then had placement of a 60 mmcervical sleeve. Excellent images were obtained with ultrasound. Patient then had a 63mm 45 tandem placed within the cervical canaland inside the endometrial cavity. Good placement was noted. Patient then had a 45 ring with a small shielding cap placed within the upper vaginal vault. This was then followed by placement of a rectal paddle posteriorly. The equipmentwas affixed to each other securely. There wasno room for vaginal packing. Intraoperative ultrasound at completion of the procedure showed continued good placement of the tandem within the uterine canal. Patient tolerated the procedure well.  She will be transferred to radiationoncology for planning and her fourthhigh-dose-rate treatment. The patient is scheduled received 5 high-dose treatments with iridium 192 as the high-dose-rate source.  PLAN OF CARE: Transferred to radiation oncology for planning and treatment  PATIENT DISPOSITION:  PACU - hemodynamically stable.   Delay start of Pharmacological VTE agent (>24hrs) due to surgical blood loss or risk of bleeding: not applicable

## 2017-01-24 NOTE — Transfer of Care (Signed)
Immediate Anesthesia Transfer of Care Note  Patient: [place]  Procedure(s) Performed: Procedure(s): TANDEM RING INSERTION (N/A)  Patient Location: PACU  Anesthesia Type:General  Level of Consciousness: awake, alert  and oriented  Airway & Oxygen Therapy: Patient Spontanous Breathing and Patient connected to nasal cannula oxygen  Post-op Assessment: Report given to RN  Post vital signs: Reviewed and stable  Last Vitals: 139/89 Vitals:   01/24/17 0614 01/24/17 0812  BP: 124/82   Pulse: 94 86  Resp: 16 17  Temp: (!) 36.4 C 36.8 C    Last Pain:  Vitals:   01/24/17 0614  TempSrc: Oral         Complications: No apparent anesthesia complications

## 2017-01-25 ENCOUNTER — Encounter (HOSPITAL_BASED_OUTPATIENT_CLINIC_OR_DEPARTMENT_OTHER): Payer: Self-pay | Admitting: Radiation Oncology

## 2017-01-26 ENCOUNTER — Ambulatory Visit
Admission: RE | Admit: 2017-01-26 | Discharge: 2017-01-26 | Disposition: A | Discharge status: Home / Self Care | Payer: Medicaid Other | Source: Ambulatory Visit | Attending: Radiation Oncology | Admitting: Radiation Oncology

## 2017-01-26 ENCOUNTER — Ambulatory Visit (HOSPITAL_BASED_OUTPATIENT_CLINIC_OR_DEPARTMENT_OTHER): Payer: Medicaid Other | Admitting: Anesthesiology

## 2017-01-26 ENCOUNTER — Ambulatory Visit (HOSPITAL_COMMUNITY)
Admission: RE | Admit: 2017-01-26 | Discharge: 2017-01-26 | Disposition: A | Discharge status: Home / Self Care | Payer: Medicaid Other | Source: Ambulatory Visit | Attending: Radiation Oncology | Admitting: Radiation Oncology

## 2017-01-26 ENCOUNTER — Encounter: Payer: Self-pay | Admitting: Radiation Oncology

## 2017-01-26 ENCOUNTER — Ambulatory Visit (HOSPITAL_BASED_OUTPATIENT_CLINIC_OR_DEPARTMENT_OTHER)
Admission: RE | Admit: 2017-01-26 | Discharge: 2017-01-26 | Disposition: A | Discharge status: Other Acute Inpatient Hospital | Payer: Medicaid Other | Source: Ambulatory Visit | Attending: Radiation Oncology | Admitting: Radiation Oncology

## 2017-01-26 ENCOUNTER — Encounter (HOSPITAL_BASED_OUTPATIENT_CLINIC_OR_DEPARTMENT_OTHER)
Admission: RE | Disposition: A | Discharge status: Other Acute Inpatient Hospital | Payer: Self-pay | Source: Ambulatory Visit | Attending: Radiation Oncology

## 2017-01-26 VITALS — BP 122/71 | HR 86

## 2017-01-26 DIAGNOSIS — Z79899 Other long term (current) drug therapy: Secondary | ICD-10-CM | POA: Insufficient documentation

## 2017-01-26 DIAGNOSIS — Z9221 Personal history of antineoplastic chemotherapy: Secondary | ICD-10-CM | POA: Diagnosis not present

## 2017-01-26 DIAGNOSIS — R590 Localized enlarged lymph nodes: Secondary | ICD-10-CM | POA: Insufficient documentation

## 2017-01-26 DIAGNOSIS — F419 Anxiety disorder, unspecified: Secondary | ICD-10-CM | POA: Diagnosis not present

## 2017-01-26 DIAGNOSIS — C530 Malignant neoplasm of endocervix: Secondary | ICD-10-CM

## 2017-01-26 DIAGNOSIS — C539 Malignant neoplasm of cervix uteri, unspecified: Secondary | ICD-10-CM

## 2017-01-26 DIAGNOSIS — F329 Major depressive disorder, single episode, unspecified: Secondary | ICD-10-CM | POA: Insufficient documentation

## 2017-01-26 DIAGNOSIS — Z803 Family history of malignant neoplasm of breast: Secondary | ICD-10-CM | POA: Insufficient documentation

## 2017-01-26 DIAGNOSIS — Z880 Allergy status to penicillin: Secondary | ICD-10-CM | POA: Diagnosis not present

## 2017-01-26 DIAGNOSIS — F17210 Nicotine dependence, cigarettes, uncomplicated: Secondary | ICD-10-CM | POA: Insufficient documentation

## 2017-01-26 DIAGNOSIS — Z923 Personal history of irradiation: Secondary | ICD-10-CM | POA: Diagnosis not present

## 2017-01-26 HISTORY — PX: TANDEM RING INSERTION: SHX6199

## 2017-01-26 SURGERY — INSERTION, UTERINE TANDEM AND RING OR CYLINDER, FOR BRACHYTHERAPY
Anesthesia: General | Site: Vagina

## 2017-01-26 MED ORDER — DEXAMETHASONE SODIUM PHOSPHATE 10 MG/ML IJ SOLN
INTRAMUSCULAR | Status: AC
  Filled 2017-01-26: qty 1

## 2017-01-26 MED ORDER — OXYCODONE HCL 5 MG/5ML PO SOLN
5.0000 mg | Freq: Once | ORAL | Status: DC | PRN
  Filled 2017-01-26: qty 5

## 2017-01-26 MED ORDER — PROPOFOL 10 MG/ML IV BOLUS
INTRAVENOUS | Status: DC | PRN
  Administered 2017-01-26: 160 mg via INTRAVENOUS

## 2017-01-26 MED ORDER — DIPHENHYDRAMINE HCL 25 MG PO CAPS
ORAL_CAPSULE | ORAL | Status: AC
  Filled 2017-01-26: qty 1

## 2017-01-26 MED ORDER — DIPHENHYDRAMINE HCL 25 MG PO TABS
25.0000 mg | ORAL_TABLET | Freq: Once | ORAL | Status: AC
  Administered 2017-01-26: 25 mg via ORAL
  Filled 2017-01-26: qty 1

## 2017-01-26 MED ORDER — FENTANYL CITRATE (PF) 100 MCG/2ML IJ SOLN
INTRAMUSCULAR | Status: DC | PRN
  Administered 2017-01-26 (×2): 25 ug via INTRAVENOUS
  Administered 2017-01-26: 50 ug via INTRAVENOUS

## 2017-01-26 MED ORDER — HYDROMORPHONE HCL 1 MG/ML IJ SOLN
INTRAMUSCULAR | Status: AC
  Filled 2017-01-26: qty 1

## 2017-01-26 MED ORDER — HYDROMORPHONE HCL-NACL 0.5-0.9 MG/ML-% IV SOSY
PREFILLED_SYRINGE | INTRAVENOUS | Status: AC
  Filled 2017-01-26: qty 1

## 2017-01-26 MED ORDER — WATER FOR IRRIGATION, STERILE IR SOLN
Status: DC | PRN
  Administered 2017-01-26: 300 mL via INTRAVESICAL

## 2017-01-26 MED ORDER — HYDROMORPHONE HCL 1 MG/ML IJ SOLN
1.0000 mg | Freq: Once | INTRAMUSCULAR | Status: AC
  Administered 2017-01-26: 1 mg via INTRAVENOUS
  Filled 2017-01-26: qty 1

## 2017-01-26 MED ORDER — HYDROMORPHONE HCL 1 MG/ML IJ SOLN
0.5000 mg | Freq: Once | INTRAMUSCULAR | Status: AC
  Administered 2017-01-26: 0.5 mg via INTRAVENOUS
  Filled 2017-01-26: qty 1

## 2017-01-26 MED ORDER — LIDOCAINE 2% (20 MG/ML) 5 ML SYRINGE
INTRAMUSCULAR | Status: AC
  Filled 2017-01-26: qty 5

## 2017-01-26 MED ORDER — ONDANSETRON HCL 4 MG/2ML IJ SOLN
INTRAMUSCULAR | Status: DC | PRN
  Administered 2017-01-26: 4 mg via INTRAVENOUS

## 2017-01-26 MED ORDER — DEXAMETHASONE SODIUM PHOSPHATE 10 MG/ML IJ SOLN
INTRAMUSCULAR | Status: DC | PRN
  Administered 2017-01-26: 10 mg via INTRAVENOUS

## 2017-01-26 MED ORDER — OXYCODONE HCL 5 MG PO TABS
5.0000 mg | ORAL_TABLET | Freq: Once | ORAL | Status: DC | PRN
  Filled 2017-01-26: qty 1

## 2017-01-26 MED ORDER — MIDAZOLAM HCL 5 MG/5ML IJ SOLN
INTRAMUSCULAR | Status: DC | PRN
  Administered 2017-01-26: 2 mg via INTRAVENOUS

## 2017-01-26 MED ORDER — PROPOFOL 10 MG/ML IV BOLUS
INTRAVENOUS | Status: AC
  Filled 2017-01-26: qty 40

## 2017-01-26 MED ORDER — KETOROLAC TROMETHAMINE 30 MG/ML IJ SOLN
INTRAMUSCULAR | Status: AC
  Filled 2017-01-26: qty 1

## 2017-01-26 MED ORDER — LACTATED RINGERS IV SOLN
INTRAVENOUS | Status: DC
  Administered 2017-01-26: 07:00:00 via INTRAVENOUS
  Filled 2017-01-26: qty 1000

## 2017-01-26 MED ORDER — HYDROMORPHONE HCL 1 MG/ML IJ SOLN
0.2500 mg | INTRAMUSCULAR | Status: DC | PRN
  Administered 2017-01-26: 0.25 mg via INTRAVENOUS
  Administered 2017-01-26: 0.5 mg via INTRAVENOUS
  Administered 2017-01-26: 0.25 mg via INTRAVENOUS
  Filled 2017-01-26: qty 0.5

## 2017-01-26 MED ORDER — FENTANYL CITRATE (PF) 100 MCG/2ML IJ SOLN
INTRAMUSCULAR | Status: AC
  Filled 2017-01-26: qty 2

## 2017-01-26 MED ORDER — ONDANSETRON HCL 4 MG/2ML IJ SOLN
INTRAMUSCULAR | Status: AC
  Filled 2017-01-26: qty 2

## 2017-01-26 MED ORDER — KETOROLAC TROMETHAMINE 30 MG/ML IJ SOLN
INTRAMUSCULAR | Status: DC | PRN
  Administered 2017-01-26: 30 mg via INTRAVENOUS

## 2017-01-26 MED ORDER — LIDOCAINE 2% (20 MG/ML) 5 ML SYRINGE
INTRAMUSCULAR | Status: DC | PRN
  Administered 2017-01-26: 80 mg via INTRAVENOUS

## 2017-01-26 MED ORDER — MIDAZOLAM HCL 2 MG/2ML IJ SOLN
INTRAMUSCULAR | Status: AC
  Filled 2017-01-26: qty 2

## 2017-01-26 MED ORDER — PROMETHAZINE HCL 25 MG/ML IJ SOLN
6.2500 mg | INTRAMUSCULAR | Status: DC | PRN
  Filled 2017-01-26: qty 1

## 2017-01-26 SURGICAL SUPPLY — 28 items
BAG URINE DRAINAGE (UROLOGICAL SUPPLIES) ×3 IMPLANT
BNDG CONFORM 2 STRL LF (GAUZE/BANDAGES/DRESSINGS) IMPLANT
CATH FOLEY 2WAY SLVR  5CC 16FR (CATHETERS) ×2
CATH FOLEY 2WAY SLVR 5CC 16FR (CATHETERS) ×1 IMPLANT
GLOVE BIO SURGEON STRL SZ7.5 (GLOVE) ×6 IMPLANT
GOWN STRL REUS W/ TWL LRG LVL3 (GOWN DISPOSABLE) ×2 IMPLANT
GOWN STRL REUS W/TWL LRG LVL3 (GOWN DISPOSABLE) ×6
HOLDER FOLEY CATH W/STRAP (MISCELLANEOUS) ×3 IMPLANT
KIT RM TURNOVER CYSTO AR (KITS) ×3 IMPLANT
NDL SPNL 22GX3.5 QUINCKE BK (NEEDLE) IMPLANT
NEEDLE SPNL 22GX3.5 QUINCKE BK (NEEDLE) IMPLANT
PACK VAGINAL MINOR WOMEN LF (CUSTOM PROCEDURE TRAY) ×3 IMPLANT
PACKING VAGINAL (PACKING) IMPLANT
PAD ABD 8X10 STRL (GAUZE/BANDAGES/DRESSINGS) ×3 IMPLANT
PAD OB MATERNITY 4.3X12.25 (PERSONAL CARE ITEMS) IMPLANT
PLUG CATH AND CAP STER (CATHETERS) IMPLANT
SET IRRIG Y TYPE TUR BLADDER L (SET/KITS/TRAYS/PACK) ×3 IMPLANT
SUT PROLENE 0 SH 30 (SUTURE) IMPLANT
SUT SILK 2 0 30  PSL (SUTURE)
SUT SILK 2 0 30 PSL (SUTURE) IMPLANT
SYR BULB IRRIGATION 50ML (SYRINGE) IMPLANT
SYR CONTROL 10ML LL (SYRINGE) IMPLANT
SYRINGE 10CC LL (SYRINGE) ×3 IMPLANT
TOWEL OR 17X24 6PK STRL BLUE (TOWEL DISPOSABLE) ×6 IMPLANT
TUBE CONNECTING 12'X1/4 (SUCTIONS)
TUBE CONNECTING 12X1/4 (SUCTIONS) IMPLANT
WATER STERILE IRR 3000ML UROMA (IV SOLUTION) ×3 IMPLANT
WATER STERILE IRR 500ML POUR (IV SOLUTION) ×3 IMPLANT

## 2017-01-26 NOTE — Progress Notes (Signed)
  Radiation Oncology         [phone] ________________________________  Name: Joanne Griffin MRN: [id number]  Date: 01/26/2017  DOB: 1985-11-21  End of Treatment Note  Diagnosis:   31 y.o. female with invasive adenocarcinoma of the cervix, clinical stage IB2 adenocarcinoma cervix with radiographically suspicious pelvic lymphadenopathy and left supraclavicular adenopathy   Indication for treatment:  Curative      Radiation treatment dates:   11/09/2016 - 12/23/2016 and HDR on 01/04/2017, 01/10/2017, 01/17/2017, 01/24/2017, 01/26/2017  Site/dose:    1) The cervix was treated to 45 Gy in 25 fractions,   2) The pelvis was treated to 9 Gy in 5 fractions as a boost to the suspicious pelvic  nodes. (54 Gy) 3) Sclav-LT was initially prescribed to 60 Gy in 30 fractions, but the patient discontinued at fraction 28 due to severe skin reactions. (56 Gy) 4) The cervix was also treated to 27.5 Gy in 5 fractions of 5.5 Gy.  Beams/energy:   1) Cervix: 3D // Photon 2) pelvic Boost: IMRT // Photon 3) Sclav-Lt: 3D // 6X, 10X Photon 4) HDR Ir-192 // Brachytherapy  Narrative: The patient tolerated radiation treatment relatively well. She reported pain to her left abdomen.  .  She reported using Silvadine cream which has been helpful for skin reaction in the left neck.  She also reported having tightness in her lower esophagus that is keeping her awake at night.  She denied having any difficulties breathing or swallowing.  She reported diarrhea and nausea that Zofran and Imodium sometimes help with.  She had some burning with urination but denied any other urinary symptoms.  She denied vaginal bleeding and had no skin issues in the pelvis area. On physical exam, she had dryness and hyperpigmentation as well as erythema over the left supraclavicular region and left upper back. She had erythema in the pubic region. She had some superficial moist desquamation in the upper gluteal fold with hyperpigmentation as  well. On exam under anesthesia during her brachytherapy treatments the cervical mass had responded very well to external beam and radiosensitizing chemotherapy  Plan: The patient has completed radiation treatment. The patient will return to radiation oncology clinic for routine followup in one month. I advised them to call or return sooner if they have any questions or concerns related to their recovery or treatment.  -----------------------------------  Blair Promise, PhD, MD  This document serves as a record of services personally performed by Gery Pray, MD. It was created on his behalf by Rae Lips, a trained medical scribe. The creation of this record is based on the scribe's personal observations and the provider's statements to them. This document has been checked and approved by the attending provider.

## 2017-01-26 NOTE — Anesthesia Postprocedure Evaluation (Signed)
Anesthesia Post Note  Patient: Joanne Griffin  Procedure(s) Performed: Procedure(s) (LRB): TANDEM RING INSERTION (N/A)     Patient location during evaluation: PACU Anesthesia Type: General Level of consciousness: awake and alert Pain management: pain level controlled Vital Signs Assessment: post-procedure vital signs reviewed and stable Respiratory status: spontaneous breathing, nonlabored ventilation, respiratory function stable and patient connected to nasal cannula oxygen Cardiovascular status: blood pressure returned to baseline and stable Postop Assessment: no signs of nausea or vomiting Anesthetic complications: no    Last Vitals:  Vitals:   01/26/17 0945 01/26/17 1000  BP: 133/78 127/73  Pulse: 64 70  Resp: 14 (!) 9  Temp:      Last Pain:  Vitals:   01/26/17 1000  TempSrc:   PainSc: 6                  Ryan P Ellender

## 2017-01-26 NOTE — H&P (View-Only) (Signed)
Radiation Oncology         [phone]) [phone] ________________________________  History and physical examination  Name: Joanne Griffin MRN: [id number]  Date: 12/03/2016  DOB: [birth year]   DIAGNOSIS: 31 year-old with invasive adenocarcinoma of the cervix, clinical stage IB2 adenocarcinoma cervix with radiographically suspicious pelvic lymphadenopathy and left supraclavicular lymphadenopathy  HISTORY OF PRESENT ILLNESS: Joanne Griffin is a 31 y.o. female who presented to the ER on 07/29/16 complaining of flank pain. She was treated for a UTI and had resolution of the flank pain although, she continued to have suprapubic pain. She has had irregular bleeding and a copious discharge for several months. Ultimately she had a Pap smear showing atypical glandular cells and a cervical biopsy on 09/01/16 revealed invasive adenocarcinoma.   PET scan revealed intense uptake in the cervix as well as upper pelvic nodal areas. In addition PET scan showed a left supraclavicular lymph node which was also palpable. The patient has recently completed her external beam radiation therapy now be taken to the operating room for placement of a tandem/ring in preparation for high-dose rate radiation therapy.   PREVIOUS RADIATION THERAPY: Yes , pelvis received 45 gray. A boost to the abnormal pelvic lymphadenopathy to 54 gray in upper pelvis region. The left supraclavicular region received 54 gray  PAST MEDICAL HISTORY:  has a past medical history of Anxiety; Cervical cancer [facility]) (oncologist-  dr gorsuch/  dr Sondra Come); Depression; History of cancer chemotherapy (11-09-2016 to 12-08-2016); History of radiation therapy (11-09-2016  to 12-22-2016); and Leukopenia due to antineoplastic chemotherapy ([place]).    PAST SURGICAL HISTORY: Past Surgical History:  Procedure Laterality Date  . CYST REMOVAL HAND  age 48  . IR FLUORO GUIDE PORT INSERTION RIGHT  10/29/2016  . IR US GUIDE VASC ACCESS RIGHT  10/29/2016  . WISDOM TOOTH EXTRACTION     times 4    FAMILY HISTORY: family history includes Breast cancer in her paternal grandmother; Diabetes in her maternal grandmother.  SOCIAL HISTORY:  reports that she has been smoking Cigarettes.  She has a 3.00 pack-year smoking history. She has never used smokeless tobacco. She reports that she does not drink alcohol or use drugs.  ALLERGIES: Amoxicillin and Penicillins  MEDICATIONS:  No current facility-administered medications for this encounter.    Current Outpatient Prescriptions  Medication Sig Dispense Refill  . buPROPion (WELLBUTRIN) 100 MG tablet Take 1 tablet (100 mg total) by mouth 2 (two) times daily. 60 tablet 1  . hyaluronate sodium (RADIAPLEXRX) GEL Apply 1 application topically 2 (two) times daily as needed.     Marland Kitchen LORazepam (ATIVAN) 0.5 MG tablet Take 1 tablet (0.5 mg total) by mouth every 8 (eight) hours. As Needed for anxiety or nausea 60 tablet 0  . morphine (MSIR) 15 MG tablet Take 1 tablet (15 mg total) by mouth every 6 (six) hours as needed for severe pain. 60 tablet 0  . ondansetron (ZOFRAN) 8 MG tablet Take 1 tablet (8 mg total) by mouth every 8 (eight) hours as needed. 60 tablet 3  . prochlorperazine (COMPAZINE) 10 MG tablet Take 1 tablet (10 mg total) by mouth every 6 (six) hours as needed (Nausea or vomiting). 60 tablet 3  . silver sulfADIAZINE (SILVADENE) 1 % cream Apply 1 application topically as needed.     Marland Kitchen levofloxacin (LEVAQUIN) 500 MG tablet Take 1 tablet (500 mg total) by mouth daily. (Patient taking differently: Take 500 mg by mouth as directed. Per pt as directed if gets fever and if feel like  getting a cold) 10 tablet 0    REVIEW OF SYSTEMS:  A 15 point review of systems is documented in the electronic medical record. This was obtained by the nursing staff. However, I reviewed this with the patient to discuss relevant findings and make appropriate changes.     PHYSICAL EXAM:   height is 5\' 2"  (1.575 m) and weight is 147 lb (66.7 kg). Her blood  pressure is 116/78 and her pulse is 92. Her oxygen saturation is 100%.   General: Alert and oriented, in no acute distress, accompanied by aunt and mother on evaluation today HEENT: Head is normocephalic. Extraocular movements are intact. Oropharynx is clear. Neck: Neck is supple. 1.5 cm lymph node in the left posterior triangle of the neck, mildly tender with palpation. Heart: Regular in rate and rhythm with no murmurs, rubs, or gallops. Chest: Clear to auscultation bilaterally, with no rhonchi, wheezes, or rales.  Abdomen: Soft, nontender, nondistended, with no rigidity or guarding. Extremities: No cyanosis or edema. Lymphatics: see Neck Exam Skin: No concerning lesions. Musculoskeletal: symmetric strength and muscle tone throughout. Neurologic: Cranial nerves II through XII are grossly intact. No obvious focalities. Speech is fluent. Coordination is intact. Psychiatric: Judgment and insight are intact. Affect is appropriate. EGBUS: Normal female Vagina: Normal, no lesions Cervix: Cervix is replaced by an exophytic nodular lesion which is slightly friable measuring approximately 4 cm in diameter. The lesion bleeds easily with exam. Bi-manual examination:    No obvious parametrial involvement  Rectal: normal sphincter tone, no masses, no blood, confirms bimanual exam    LABORATORY DATA:  Lab Results  Component Value Date   WBC 2.0 (L) 01/03/2017   HGB 10.9 (L) 01/03/2017   HCT 32.0 (L) 01/03/2017   MCV 89.2 01/03/2017   PLT 336 01/03/2017   NEUTROABS 1.3 (L) 01/03/2017   Lab Results  Component Value Date   NA 136 12/30/2016   K 3.9 12/30/2016   CL 106 12/30/2016   CO2 25 12/30/2016   GLUCOSE 119 (H) 12/30/2016   CREATININE 0.74 12/30/2016   CALCIUM 8.8 (L) 12/30/2016      RADIOGRAPHY: No results found.    IMPRESSION: Stage IV adenocarcinoma of the cervix by virtue of solitary left supraclavicular lymph node. The patient is being treated with curative intent has completed  external beam and radiosensitizing chemotherapy. She is now ready to proceed with brachytherapy directed at the cervical region. She will likely proceed with full dose chemotherapy after his she has completed her brachytherapy.  PLAN: Patient will be taken to the operating room on July 10 for her first high-dose-rate treatment. The patient will receive a total of 5 high-dose rate treatments using the tandem ring system.  Iridium 192 will be the high-dose-rate source.     ------------------------------------------------  Blair Promise, PhD, MD

## 2017-01-26 NOTE — Discharge Instructions (Signed)

## 2017-01-26 NOTE — Progress Notes (Signed)
IMMEDIATELY FOLLOWING SURGERY: Do not drive or operate machinery for the first twenty four hours after surgery. Do not make any important decisions for twenty four hours after surgery or while taking narcotic pain medications or sedatives. If you develop intractable nausea and vomiting or a severe headache please notify your doctor immediately.   FOLLOW-UP: You do not need to follow up with anesthesia unless specifically instructed to do so.   WOUND CARE INSTRUCTIONS (if applicable): Expect some mild vaginal bleeding, but if large amount of bleeding occurs please contact Dr. Sondra Come at [phone] or the Radiation On-Call physician. Call for any fever greater than 101.0 degrees or increasing vaginal//abdominal pain or trouble urinating.   QUESTIONS?: Please feel free to call your physician or the hospital operator if you have any questions, and they will be happy to assist you. Resume all medications: as listed on your after visit summary. Your next appointment is:  Future Appointments Date Time Provider [place]  01/26/2017 3:00 PM Gery Pray, MD [facility] None  01/28/2017 11:15 AM [facility] LAB 3 [facility] None  01/28/2017 11:30 AM [facility] G22 [facility] None  01/28/2017 12:15 PM Heath Lark, MD [facility] None  02/25/2017 1:15 PM [name], MD [facility] None  03/03/2017 10:30 AM Gery Pray, MD [facility] None

## 2017-01-26 NOTE — Anesthesia Preprocedure Evaluation (Addendum)
Anesthesia Evaluation  Patient identified by MRN, date of birth, ID band Patient awake    Reviewed: Allergy & Precautions, NPO status , Patient's Chart, lab work & pertinent test results  History of Anesthesia Complications Negative for: history of anesthetic complications  Airway Mallampati: II  TM Distance: >3 FB Neck ROM: Full    Dental  (+) Chipped, Dental Advisory Given,    Pulmonary Current Smoker,    breath sounds clear to auscultation       Cardiovascular negative cardio ROS   Rhythm:Regular Rate:Normal     Neuro/Psych Anxiety Depression negative neurological ROS     GI/Hepatic Neg liver ROS, neg GERD  ,  Endo/Other  negative endocrine ROS  Renal/GU negative Renal ROS   Cervical cancer: chemo, XRT    Musculoskeletal negative musculoskeletal ROS (+)   Abdominal   Peds  Hematology negative hematology ROS (+)   Anesthesia Other Findings Chemo and radiation  Reproductive/Obstetrics Cervix cancer                            Anesthesia Physical  Anesthesia Plan  ASA: III  Anesthesia Plan: General   Post-op Pain Management:    Induction: Intravenous  PONV Risk Score and Plan: 2 and Ondansetron, Dexamethasone, Propofol infusion and Midazolam  Airway Management Planned: LMA  Additional Equipment:   Intra-op Plan:   Post-operative Plan: Extubation in OR  Informed Consent: I have reviewed the patients History and Physical, chart, labs and discussed the procedure including the risks, benefits and alternatives for the proposed anesthesia with the patient or authorized representative who has indicated his/her understanding and acceptance.   Dental advisory given  Plan Discussed with: CRNA and Surgeon  Anesthesia Plan Comments: (hcg negative)       Anesthesia Quick Evaluation

## 2017-01-26 NOTE — Interval H&P Note (Signed)
History and Physical Interval Note:  01/26/2017 8:41 AM  Joanne Griffin  has presented today for surgery, with the diagnosis of cervix cancer  The various methods of treatment have been discussed with the patient and family. After consideration of risks, benefits and other options for treatment, the patient has consented to  Procedure(s): TANDEM RING INSERTION (N/A) as a surgical intervention .  The patient's history has been reviewed, patient examined, no change in status, stable for surgery.  I have reviewed the patient's chart and labs.  Questions were answered to the patient's satisfaction.     Gery Pray

## 2017-01-26 NOTE — Op Note (Signed)
01/26/2017  9:39 AM  PATIENT:  Joanne Griffin  31 y.o. female  PRE-OPERATIVE DIAGNOSIS:  cervix cancer  POST-OPERATIVE DIAGNOSIS:  cervix cancer  PROCEDURE:  Procedure(s): TANDEM RING INSERTION (N/A)  SURGEON:  Surgeon(s) and Role:    * Gery Pray, MD - Primary  PHYSICIAN ASSISTANT:   ASSISTANTS: none   ANESTHESIA:   general  EBL:  Total I/O In: 500 [I.V.:500] Out: 0   BLOOD ADMINISTERED:none  DRAINS: Urinary Catheter (Foley)   LOCAL MEDICATIONS USED:  NONE  SPECIMEN:  No Specimen  DISPOSITION OF SPECIMEN:  N/A  COUNTS:  YES  TOURNIQUET:  * No tourniquets in log *  DICTATION: The patient was taken to the outpatient OR #1. A general anesthetic was applied. Timeout was performed for the length of the procedure,preoperative antibiotics (none) and allergies. The patient was prepped and draped in the usual sterile fashion and placed in the dorsolithotomy position. A Foley catheter was placed without difficulty. The bladder was backfilled with approximately 250 mL of sterile water for ultrasound imaging purposes. A minimal amount of Trendelenburg position was used during the procedure.. The uterus sounded to approximately 6 cm. The uterus was noted to be in moderate anteverted position. Patient had dilation of the cervix. Patient then had placement of a 60 mmcervical sleeve. Excellent images were obtained with ultrasound. Patient then had a 50mm 45 tandem placed within the cervical canaland inside the endometrial cavity. Good placement was noted. Patient then had a 45 ring with a small shielding cap placed within the upper vaginal vault. This was then followed by placement of a rectal paddle posteriorly. The equipmentwas affixed to each other securely. There wasno room for vaginal packing. Intraoperative ultrasound at completion of the procedure showed continued good placement of the tandem within the uterine canal. Patient tolerated the procedure well. She will be  transferred to radiationoncology for planning and her fifthhigh-dose-rate treatment. The patient is scheduled received 5 high-dose treatments with iridium 192 as the high-dose-rate source.  PLAN OF CARE: Transferred to radiation oncology for planning and treatment  PATIENT DISPOSITION:  PACU - hemodynamically stable.   Delay start of Pharmacological VTE agent (>24hrs) due to surgical blood loss or risk of bleeding: not applicable

## 2017-01-26 NOTE — Progress Notes (Signed)
Foley catheter removed intact.  Left hand IV removed intact.  Pressure and bandaid applied.  Patient given discharge instructions and paperwork.  She was discharged from the clinic with her family. 

## 2017-01-26 NOTE — Anesthesia Procedure Notes (Signed)
Procedure Name: LMA Insertion Date/Time: 01/26/2017 8:39 AM Performed by: Wanita Chamberlain Pre-anesthesia Checklist: Patient identified, Emergency Drugs available, Suction available, Patient being monitored and Timeout performed Patient Re-evaluated:Patient Re-evaluated prior to induction Oxygen Delivery Method: Circle system utilized Preoxygenation: Pre-oxygenation with 100% oxygen Induction Type: IV induction LMA: LMA inserted LMA Size: 4.0 Number of attempts: 1 Placement Confirmation: positive ETCO2 and breath sounds checked- equal and bilateral Tube secured with: Tape Dental Injury: Teeth and Oropharynx as per pre-operative assessment

## 2017-01-26 NOTE — Progress Notes (Signed)
  Radiation Oncology         [phone] ________________________________  Name: Joanne Griffin MRN: [id number]  Date: 01/26/2017  DOB: 07/27/1985  SIMULATION AND TREATMENT PLANNING NOTE HDR BRACHYTHERAPY  DIAGNOSIS:  31 y.o. woman with invasive adenocarcinoma of the cervix, clinical stage IB2 adenocarcinoma cervix with radiographically suspicious pelvic lymphadenopathy and left supraclavicular lymphadenopathy.  NARRATIVE:  The patient was brought to the [place].  Identity was confirmed.  All relevant records and images related to the planned course of therapy were reviewed.  The patient freely provided informed written consent to proceed with treatment after reviewing the details related to the planned course of therapy. The consent form was witnessed and verified by the simulation staff.  Then, the patient was set-up in a stable reproducible  supine position for radiation therapy.  CT images were obtained.  Surface markings were placed.  The CT images were loaded into the planning software.  Then the target and avoidance structures were contoured.  Treatment planning then occurred.  The radiation prescription was entered and confirmed.   I have requested : Brachytherapy Isodose Plan and Dosimetry Calculations to plan the radiation distribution.    PLAN:  The patient will receive 5.5 Gy in 1 fraction.  Patient will be treated with a tandem ring system using iridium 192 as the high-dose-rate source.  ________________________________  Blair Promise, PhD, MD   This document serves as a record of services personally performed by Gery Pray, MD. It was created on his behalf by Rae Lips, a trained medical scribe. The creation of this record is based on the scribe's personal observations and the provider's statements to them. This document has been checked and approved by the attending provider.

## 2017-01-26 NOTE — Progress Notes (Signed)
  Radiation Oncology         [phone] ________________________________  Name: Joanne Griffin MRN: [id number]  Date: 01/26/2017  DOB: [birth year]  CC: Patient, No Pcp Per  [name],*  HDR BRACHYTHERAPY NOTE  DIAGNOSIS: 31 y.o. woman with invasive adenocarcinoma of the cervix, clinical stage IB2 adenocarcinoma cervix with radiographically suspicious pelvic lymphadenopathy and left supraclavicular lymphadenopathy.  NARRATIVE: The patient was brought to the HDR suite. Identity was confirmed. All relevant records and images related to the planned course of therapy were reviewed. The patient freely provided informed written consent to proceed with treatment after reviewing the details related to the planned course of therapy. The consent form was witnessed and verified by the simulation staff. Then, the patient was set-up in a stable reproducible supine position for radiation therapy. The tandem ring system was accessed and fiducial markers were placed within the tandem and ring.   Simple treatment device note: On the operating room the patient had construction of her custom tandem ring system. She will be treated with a 45 tandem/ring system. The patient had placement of a 60 mm tandem. A cervical ring with a small shielding was used for her treatment. A rectal paddle was also part of her custom set up device.  Verification simulation note: An AP and lateral film was obtained through the pelvis area. This was compared to the patient's planning films documenting accurate position of the tandem/ring system for treatment.  High-dose-rate brachytherapy treatment note:  The remote afterloading device was accessed through catheter system and attached to the tandem ring system. Patient then proceeded to undergo her fifth and final high-dose-rate treatment directed at the cervix. The patient was prescribed a dose of 5.5 gray to be delivered to the [place]. Patient was treated with 2 channels  using 23 dwell positions. Treatment time was 311.9 seconds. The patient tolerated the procedure well. After completion of her therapy, a radiation survey was performed documenting return of the iridium source into the [employer] safe. The patient was then transferred to the nursing suite. She then had removal of the rectal paddle followed by the tandem and ring system. The patient tolerated the removal well.  PLAN: The patient has completed radiation treatment. She will return for follow up in one month. ________________________________   Blair Promise, PhD, MD  This document serves as a record of services personally performed by Gery Pray, MD. It was created on his behalf by Rae Lips, a trained medical scribe. The creation of this record is based on the scribe's personal observations and the provider's statements to them. This document has been checked and approved by the attending provider.

## 2017-01-26 NOTE — Transfer of Care (Signed)
Immediate Anesthesia Transfer of Care Note  Patient: Joanne Griffin  Procedure(s) Performed: Procedure(s): TANDEM RING INSERTION (N/A)  Patient Location: PACU  Anesthesia Type:General  Level of Consciousness: awake, alert , oriented and patient cooperative  Airway & Oxygen Therapy: Patient Spontanous Breathing and Patient connected to nasal cannula oxygen  Post-op Assessment: Report given to RN and Post -op Vital signs reviewed and stable  Post vital signs: Reviewed and stable  Last Vitals:  Vitals:   01/26/17 0657  BP: 126/80  Pulse: 80  Resp: 16  Temp: 36.5 C    Last Pain:  Vitals:   01/26/17 0707  TempSrc:   PainSc: 4          Complications: No apparent anesthesia complications

## 2017-01-27 ENCOUNTER — Encounter (HOSPITAL_BASED_OUTPATIENT_CLINIC_OR_DEPARTMENT_OTHER): Payer: Self-pay | Admitting: Radiation Oncology

## 2017-01-28 ENCOUNTER — Other Ambulatory Visit (HOSPITAL_BASED_OUTPATIENT_CLINIC_OR_DEPARTMENT_OTHER): Payer: Medicaid Other

## 2017-01-28 ENCOUNTER — Ambulatory Visit (HOSPITAL_BASED_OUTPATIENT_CLINIC_OR_DEPARTMENT_OTHER): Payer: Medicaid Other | Admitting: Hematology and Oncology

## 2017-01-28 ENCOUNTER — Ambulatory Visit (HOSPITAL_BASED_OUTPATIENT_CLINIC_OR_DEPARTMENT_OTHER): Payer: Medicaid Other

## 2017-01-28 ENCOUNTER — Encounter: Payer: Self-pay | Admitting: Hematology and Oncology

## 2017-01-28 DIAGNOSIS — G893 Neoplasm related pain (acute) (chronic): Secondary | ICD-10-CM

## 2017-01-28 DIAGNOSIS — C530 Malignant neoplasm of endocervix: Secondary | ICD-10-CM

## 2017-01-28 DIAGNOSIS — C801 Malignant (primary) neoplasm, unspecified: Secondary | ICD-10-CM

## 2017-01-28 DIAGNOSIS — H9312 Tinnitus, left ear: Secondary | ICD-10-CM | POA: Diagnosis not present

## 2017-01-28 DIAGNOSIS — Z95828 Presence of other vascular implants and grafts: Secondary | ICD-10-CM

## 2017-01-28 DIAGNOSIS — R748 Abnormal levels of other serum enzymes: Secondary | ICD-10-CM | POA: Insufficient documentation

## 2017-01-28 DIAGNOSIS — Z452 Encounter for adjustment and management of vascular access device: Secondary | ICD-10-CM

## 2017-01-28 LAB — CBC WITH DIFFERENTIAL/PLATELET
BASO%: 0.4 % (ref 0.0–2.0)
BASOS ABS: 0 10*3/uL (ref 0.0–0.1)
EOS ABS: 0 10*3/uL (ref 0.0–0.5)
EOS%: 0.5 % (ref 0.0–7.0)
HCT: 34.4 % — ABNORMAL LOW (ref 34.8–46.6)
HEMOGLOBIN: 11.6 g/dL (ref 11.6–15.9)
LYMPH#: 0.5 10*3/uL — AB (ref 0.9–3.3)
LYMPH%: 7.3 % — ABNORMAL LOW (ref 14.0–49.7)
MCH: 31.1 pg (ref 25.1–34.0)
MCHC: 33.6 g/dL (ref 31.5–36.0)
MCV: 92.5 fL (ref 79.5–101.0)
MONO#: 0.4 10*3/uL (ref 0.1–0.9)
MONO%: 6.9 % (ref 0.0–14.0)
NEUT%: 84.9 % — ABNORMAL HIGH (ref 38.4–76.8)
NEUTROS ABS: 5.3 10*3/uL (ref 1.5–6.5)
Platelets: 324 10*3/uL (ref 145–400)
RBC: 3.72 10*6/uL (ref 3.70–5.45)
RDW: 19.4 % — AB (ref 11.2–14.5)
WBC: 6.2 10*3/uL (ref 3.9–10.3)

## 2017-01-28 LAB — MAGNESIUM: MAGNESIUM: 2.1 mg/dL (ref 1.5–2.5)

## 2017-01-28 LAB — COMPREHENSIVE METABOLIC PANEL
ALBUMIN: 3.6 g/dL (ref 3.5–5.0)
ALK PHOS: 56 U/L (ref 40–150)
ALT: 100 U/L — ABNORMAL HIGH (ref 0–55)
AST: 37 U/L — AB (ref 5–34)
Anion Gap: 10 mEq/L (ref 3–11)
BILIRUBIN TOTAL: 0.32 mg/dL (ref 0.20–1.20)
BUN: 9.5 mg/dL (ref 7.0–26.0)
CALCIUM: 9.3 mg/dL (ref 8.4–10.4)
CO2: 25 mEq/L (ref 22–29)
Chloride: 104 mEq/L (ref 98–109)
Creatinine: 0.8 mg/dL (ref 0.6–1.1)
EGFR: >90 mL/min/{1.73_m2} (ref >90)
Glucose: 120 mg/dl (ref 70–140)
POTASSIUM: 3.4 meq/L — AB (ref 3.5–5.1)
SODIUM: 139 meq/L (ref 136–145)
Total Protein: 6.8 g/dL (ref 6.4–8.3)

## 2017-01-28 MED ORDER — SODIUM CHLORIDE 0.9% FLUSH
10.0000 mL | INTRAVENOUS | Status: DC | PRN
  Administered 2017-01-28: 10 mL
  Filled 2017-01-28: qty 10

## 2017-01-28 MED ORDER — HEPARIN SOD (PORK) LOCK FLUSH 100 UNIT/ML IV SOLN
500.0000 [IU] | Freq: Once | INTRAVENOUS | Status: AC | PRN
  Administered 2017-01-28: 500 [IU]
  Filled 2017-01-28: qty 5

## 2017-01-28 MED ORDER — ALTEPLASE 2 MG IJ SOLR
2.0000 mg | Freq: Once | INTRAMUSCULAR | Status: DC | PRN
  Filled 2017-01-28: qty 2

## 2017-01-28 MED ORDER — HEPARIN SOD (PORK) LOCK FLUSH 100 UNIT/ML IV SOLN
250.0000 [IU] | Freq: Once | INTRAVENOUS | Status: DC | PRN
  Filled 2017-01-28: qty 5

## 2017-01-28 NOTE — Assessment & Plan Note (Signed)
She has mild tinnitus Could be due to side effects of chemo Hopefully it would resolve in the near future Observe only

## 2017-01-28 NOTE — Assessment & Plan Note (Addendum)
Clinically, she had positive response to treatment Her pelvic pain had resolved The neck mass is no longer palpable She has appointment to follow-up with radiation oncologist and GYN oncologist She will need follow-up PET CT scan in the future I would defer to her GYN oncologist to determine the timing of the PET scan I will get her port flushed and maintained every 6-8 weeks I plan to see her back in October for further supportive care

## 2017-01-28 NOTE — Assessment & Plan Note (Signed)
Her cancer pain has resolved I did not refill her prescription pain medication

## 2017-01-28 NOTE — Assessment & Plan Note (Signed)
The cause is unknown She is not symptomatic Could be due to recent side effects of medication I recommend observation only and plan to recheck it again in her next visit

## 2017-01-28 NOTE — Progress Notes (Signed)
[facility] OFFICE PROGRESS NOTE  Patient Care Team: Patient, No Pcp Per as PCP - General (General Practice)  SUMMARY OF ONCOLOGIC HISTORY:   Adenocarcinoma (GYN origin) (Resolved)   09/01/2016 Initial Diagnosis    Adenocarcinoma (GYN origin)      Cervical cancer ([place])   08/26/2016 Pathology Results    Adequacy Reason Satisfactory for evaluation, endocervical/transformation zone component PRESENT. Diagnosis ATYPICAL GLANDULAR CELLS. SEE COMMENT. COMMENT: THERE ARE GROUPS OF ATYPICAL GLANDULAR CELLS WHICH APPEAR TO BE OF ENDOCERVICAL ORIGIN. TISSUE STUDIES, SUCH AS A CURETTAGE, MAY HELP BETTER EVALUATE THE EXTENT AND SEVERITY OF THESE ATYPICAL CELLS.      09/01/2016 Initial Diagnosis    The patient reports she is not had Pap smears in the "many years". She presented initially with flank pain to the emergency room on 07/29/2016. She was treated for a UTI and had resolution of her flank pain although she continued to have suprapubic pain. She has had irregular bleeding and a copious discharge for several months. 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 Microscopic Comment Immunohistochemistry is attempted to determine endocervical vs. endometrial origin. CEA is focally positive, p16 is diffusely strongly positive, ER is weakly positive, and vimentin is positive. This profile is not specific as to origin and clinical correlation is recommended. Dr. Avis Epley has reviewed the case. The case was called to Dr. Ilda Basset on 09/06/2016      10/05/2016 Imaging    Ct abdomen: 4.7 cm heterogeneous enhancing mass in the cervix and lower uterine segment, consistent with known cervical carcinoma. Mild retroperitoneal lymphadenopathy in the inferior aortocaval space and right common iliac chain,  suspicious for metastatic disease.      10/29/2016 Procedure    Ultrasound and fluoroscopically guided right internal jugular single lumen power port catheter insertion. Tip in the SVC/RA junction. Catheter ready for use      11/09/2016 -  Radiation Therapy    She receives concurrent radiation treatment      11/09/2016 - 12/08/2016 Chemotherapy    She receives weekly cisplatin       INTERVAL HISTORY: Please see below for problem oriented charting. She returns with her mother for further follow-up Her pelvic pain has resolved She had no recent nausea or vomiting or constipation She had mild tinnitus Denies peripheral neuropathy No abnormal vaginal bleeding The rash on her left side of the neck had resolved She denies recent smoking  REVIEW OF SYSTEMS:   Constitutional: Denies fevers, chills or abnormal weight loss Eyes: Denies blurriness of vision Ears, nose, mouth, throat, and face: Denies mucositis or sore throat Respiratory: Denies cough, dyspnea or wheezes Cardiovascular: Denies palpitation, chest discomfort or lower extremity swelling Gastrointestinal:  Denies nausea, heartburn or change in bowel habits Skin: Denies abnormal skin rashes Lymphatics: Denies new lymphadenopathy or easy bruising Neurological:Denies numbness, tingling or new weaknesses Behavioral/Psych: Mood is stable, no new changes  All other systems were reviewed with the patient and are negative.  I have reviewed the past medical history, past surgical history, social history and family history with the patient and they are unchanged from previous note.  ALLERGIES:  is allergic to amoxicillin and penicillins.  MEDICATIONS:  Current Outpatient Prescriptions  Medication Sig Dispense Refill  . buPROPion (WELLBUTRIN) 100 MG tablet Take 1 tablet (100 mg total) by mouth  2 (two) times daily. 60 tablet 1  . hyaluronate sodium (RADIAPLEXRX) GEL Apply 1 application topically 2 (two) times daily as needed.     Marland Kitchen  levofloxacin (LEVAQUIN) 500 MG tablet Take 1 tablet (500 mg total) by mouth daily. (Patient taking differently: Take 500 mg by mouth as directed. Per pt as directed if gets fever and if feel like getting a cold) 10 tablet 0  . LORazepam (ATIVAN) 0.5 MG tablet Take 1 tablet (0.5 mg total) by mouth every 8 (eight) hours. As Needed for anxiety or nausea 60 tablet 0  . morphine (MSIR) 15 MG tablet Take 1 tablet (15 mg total) by mouth every 6 (six) hours as needed for severe pain. 60 tablet 0  . ondansetron (ZOFRAN) 8 MG tablet Take 1 tablet (8 mg total) by mouth every 8 (eight) hours as needed. 60 tablet 3  . prochlorperazine (COMPAZINE) 10 MG tablet Take 1 tablet (10 mg total) by mouth every 6 (six) hours as needed (Nausea or vomiting). 60 tablet 3  . silver sulfADIAZINE (SILVADENE) 1 % cream Apply 1 application topically as needed.      No current facility-administered medications for this visit.     PHYSICAL EXAMINATION: ECOG PERFORMANCE STATUS: 1 - Symptomatic but completely ambulatory  Vitals:   01/28/17 1214  BP: 125/73  Pulse: 90  Resp: 20  Temp: 98 F (36.7 C)   Filed Weights   01/28/17 1214  Weight: 151 lb 4.8 oz (68.6 kg)    GENERAL:alert, no distress and comfortable SKIN: skin color, texture, turgor are normal, no rashes or significant lesions.  Noted discoloration on the left side of the neck EYES: normal, Conjunctiva are pink and non-injected, sclera clear OROPHARYNX:no exudate, no erythema and lips, buccal mucosa, and tongue normal  NECK: supple, thyroid normal size, non-tender, without nodularity LYMPH:  no palpable lymphadenopathy in the cervical, axillary or inguinal LUNGS: clear to auscultation and percussion with normal breathing effort HEART: regular rate & rhythm and no murmurs and no lower extremity edema ABDOMEN:abdomen soft, non-tender and normal bowel sounds Musculoskeletal:no cyanosis of digits and no clubbing  NEURO: alert & oriented x 3 with fluent speech,  no focal motor/sensory deficits  LABORATORY DATA:  I have reviewed the data as listed    Component Value Date/Time   NA 139 01/28/2017 1105   K 3.4 (L) 01/28/2017 1105   CL 102 01/07/2017 1346   CO2 25 01/28/2017 1105   GLUCOSE 120 01/28/2017 1105   BUN 9.5 01/28/2017 1105   CREATININE 0.8 01/28/2017 1105   CALCIUM 9.3 01/28/2017 1105   PROT 6.8 01/28/2017 1105   ALBUMIN 3.6 01/28/2017 1105   AST 37 (H) 01/28/2017 1105   ALT 100 (H) 01/28/2017 1105   ALKPHOS 56 01/28/2017 1105   BILITOT 0.32 01/28/2017 1105   GFRNONAA >60 01/07/2017 1346   GFRAA >60 01/07/2017 1346    No results found for: SPEP, UPEP  Lab Results  Component Value Date   WBC 6.2 01/28/2017   NEUTROABS 5.3 01/28/2017   HGB 11.6 01/28/2017   HCT 34.4 (L) 01/28/2017   MCV 92.5 01/28/2017   PLT 324 01/28/2017      Chemistry      Component Value Date/Time   NA 139 01/28/2017 1105   K 3.4 (L) 01/28/2017 1105   CL 102 01/07/2017 1346   CO2 25 01/28/2017 1105   BUN 9.5 01/28/2017 1105   CREATININE 0.8 01/28/2017 1105      Component Value Date/Time  CALCIUM 9.3 01/28/2017 1105   ALKPHOS 56 01/28/2017 1105   AST 37 (H) 01/28/2017 1105   ALT 100 (H) 01/28/2017 1105   BILITOT 0.32 01/28/2017 1105       RADIOGRAPHIC STUDIES: I have personally reviewed the radiological images as listed and agreed with the findings in the report. [place] Intraoperative  Result Date: 01/26/2017 CLINICAL DATA:  Ultrasound was provided for use by the ordering physician, and a technical charge was applied by the performing facility.  No radiologist interpretation/professional services rendered.   [place] Intraoperative  Result Date: 01/24/2017 CLINICAL DATA:  Ultrasound was provided for use by the ordering physician, and a technical charge was applied by the performing facility.  No radiologist interpretation/professional services rendered.   [place] Intraoperative  Result Date: 01/17/2017 CLINICAL DATA:  Ultrasound was provided for  use by the ordering physician, and a technical charge was applied by the performing facility.  No radiologist interpretation/professional services rendered.   [place] Intraoperative  Result Date: 01/10/2017 CLINICAL DATA:  Ultrasound was provided for use by the ordering physician, and a technical charge was applied by the performing facility.  No radiologist interpretation/professional services rendered.   [place] Intraoperative  Result Date: 01/04/2017 CLINICAL DATA:  Ultrasound was provided for use by the ordering physician, and a technical charge was applied by the performing facility.  No radiologist interpretation/professional services rendered.    ASSESSMENT & PLAN:  Cervical cancer ([place]) Clinically, she had positive response to treatment Her pelvic pain had resolved The neck mass is no longer palpable She has appointment to follow-up with radiation oncologist and GYN oncologist She will need follow-up PET CT scan in the future I would defer to her GYN oncologist to determine the timing of the PET scan I will get her port flushed and maintained every 6-8 weeks I plan to see her back in October for further supportive care  Elevated liver enzymes The cause is unknown She is not symptomatic Could be due to recent side effects of medication I recommend observation only and plan to recheck it again in her next visit  Cancer associated pain Her cancer pain has resolved I did not refill her prescription pain medication   Tinnitus of left ear She has mild tinnitus Could be due to side effects of chemo Hopefully it would resolve in the near future Observe only   No orders of the defined types were placed in this encounter.  All questions were answered. The patient knows to call the clinic with any problems, questions or concerns. No barriers to learning was detected. I spent 15 minutes counseling the patient face to face. The total time spent in the appointment was 20 minutes and more  than 50% was on counseling and review of test results     Heath Lark, MD 01/28/2017 4:50 PM

## 2017-01-28 NOTE — Patient Instructions (Signed)
Implanted Port Home Guide An implanted port is a type of central line that is placed under the skin. Central lines are used to provide IV access when treatment or nutrition needs to be given through a person's veins. Implanted ports are used for long-term IV access. An implanted port may be placed because:  You need IV medicine that would be irritating to the small veins in your hands or arms.  You need long-term IV medicines, such as antibiotics.  You need IV nutrition for a long period.  You need frequent blood draws for lab tests.  You need dialysis.  Implanted ports are usually placed in the chest area, but they can also be placed in the upper arm, the abdomen, or the leg. An implanted port has two main parts:  Reservoir. The reservoir is round and will appear as a small, raised area under your skin. The reservoir is the part where a needle is inserted to give medicines or draw blood.  Catheter. The catheter is a thin, flexible tube that extends from the reservoir. The catheter is placed into a large vein. Medicine that is inserted into the reservoir goes into the catheter and then into the vein.  How will I care for my incision site? Do not get the incision site wet. Bathe or shower as directed by your health care provider. How is my port accessed? Special steps must be taken to access the port:  Before the port is accessed, a numbing cream can be placed on the skin. This helps numb the skin over the port site.  Your health care provider uses a sterile technique to access the port. ? Your health care provider must put on a mask and sterile gloves. ? The skin over your port is cleaned carefully with an antiseptic and allowed to dry. ? The port is gently pinched between sterile gloves, and a needle is inserted into the port.  Only "non-coring" port needles should be used to access the port. Once the port is accessed, a blood return should be checked. This helps ensure that the port  is in the vein and is not clogged.  If your port needs to remain accessed for a constant infusion, a clear (transparent) bandage will be placed over the needle site. The bandage and needle will need to be changed every week, or as directed by your health care provider.  Keep the bandage covering the needle clean and dry. Do not get it wet. Follow your health care provider's instructions on how to take a shower or bath while the port is accessed.  If your port does not need to stay accessed, no bandage is needed over the port.  What is flushing? Flushing helps keep the port from getting clogged. Follow your health care provider's instructions on how and when to flush the port. Ports are usually flushed with saline solution or a medicine called heparin. The need for flushing will depend on how the port is used.  If the port is used for intermittent medicines or blood draws, the port will need to be flushed: ? After medicines have been given. ? After blood has been drawn. ? As part of routine maintenance.  If a constant infusion is running, the port may not need to be flushed.  How long will my port stay implanted? The port can stay in for as long as your health care provider thinks it is needed. When it is time for the port to come out, surgery will be   done to remove it. The procedure is similar to the one performed when the port was put in. When should I seek immediate medical care? When you have an implanted port, you should seek immediate medical care if:  You notice a bad smell coming from the incision site.  You have swelling, redness, or drainage at the incision site.  You have more swelling or pain at the port site or the surrounding area.  You have a fever that is not controlled with medicine.  This information is not intended to replace advice given to you by your health care provider. Make sure you discuss any questions you have with your health care provider. Document  Released: 06/14/2005 Document Revised: 11/20/2015 Document Reviewed: 02/19/2013 Elsevier Interactive Patient Education  2017 Elsevier Inc.  

## 2017-01-28 NOTE — Progress Notes (Signed)
Accessed port easily but no blood return. TPA to port @ 12noon. Pt went back to lab to have labs done peripherally. Hassan Rowan, RN with Dr. Alvy Bimler made aware. Pt has appt with Dr. Alvy Bimler @ 12:15 pm. Hassan Rowan, RN will check port during that appt time. Pt voiced understanding of situation.

## 2017-01-31 ENCOUNTER — Telehealth: Payer: Self-pay | Admitting: Oncology

## 2017-01-31 ENCOUNTER — Other Ambulatory Visit: Payer: Self-pay | Admitting: Radiation Oncology

## 2017-01-31 DIAGNOSIS — C530 Malignant neoplasm of endocervix: Secondary | ICD-10-CM

## 2017-01-31 MED ORDER — MORPHINE SULFATE 15 MG PO TABS: 15.0000 mg | ORAL_TABLET | Freq: Four times a day (QID) | ORAL | 0 refills | Status: DC | PRN

## 2017-01-31 NOTE — Telephone Encounter (Signed)
Spoke with patient about upcoming appointments for 9/14, and 10/29.

## 2017-01-31 NOTE — Telephone Encounter (Signed)
Patient called and asked for a refill on Morphine.  She said she is taking 2-4 tablets per day and it was last filled on 01/17/17.

## 2017-02-03 ENCOUNTER — Other Ambulatory Visit: Payer: Self-pay | Admitting: Radiation Oncology

## 2017-02-03 ENCOUNTER — Other Ambulatory Visit: Payer: Self-pay | Admitting: Oncology

## 2017-02-03 DIAGNOSIS — C530 Malignant neoplasm of endocervix: Secondary | ICD-10-CM

## 2017-02-03 MED ORDER — LORAZEPAM 0.5 MG PO TABS: 0.5000 mg | ORAL_TABLET | Freq: Three times a day (TID) | ORAL | 0 refills | Status: DC

## 2017-02-03 NOTE — Progress Notes (Signed)
Called in refill for Ativan to [facility] per Dr. Sondra Come.

## 2017-02-16 ENCOUNTER — Telehealth: Payer: Self-pay | Admitting: *Deleted

## 2017-02-16 NOTE — Telephone Encounter (Signed)
"  I have a question that was on my chart health summary.  If you could call me back [phone]."  This nurse called.  Message left requesting return call if needed.  Awaiting return call.

## 2017-02-17 ENCOUNTER — Telehealth: Payer: Self-pay | Admitting: *Deleted

## 2017-02-17 ENCOUNTER — Other Ambulatory Visit (HOSPITAL_BASED_OUTPATIENT_CLINIC_OR_DEPARTMENT_OTHER): Payer: Medicaid Other

## 2017-02-17 ENCOUNTER — Encounter: Payer: Self-pay | Admitting: Hematology and Oncology

## 2017-02-17 DIAGNOSIS — C801 Malignant (primary) neoplasm, unspecified: Secondary | ICD-10-CM

## 2017-02-17 DIAGNOSIS — C530 Malignant neoplasm of endocervix: Secondary | ICD-10-CM

## 2017-02-17 DIAGNOSIS — R748 Abnormal levels of other serum enzymes: Secondary | ICD-10-CM

## 2017-02-17 LAB — COMPREHENSIVE METABOLIC PANEL
ALBUMIN: 3.6 g/dL (ref 3.5–5.0)
ALK PHOS: 65 U/L (ref 40–150)
ALT: 52 U/L (ref 0–55)
ANION GAP: 7 meq/L (ref 3–11)
AST: 22 U/L (ref 5–34)
BILIRUBIN TOTAL: 0.36 mg/dL (ref 0.20–1.20)
BUN: 7.5 mg/dL (ref 7.0–26.0)
CALCIUM: 9.6 mg/dL (ref 8.4–10.4)
CO2: 24 meq/L (ref 22–29)
CREATININE: 0.9 mg/dL (ref 0.6–1.1)
Chloride: 106 mEq/L (ref 98–109)
EGFR: 80 mL/min/{1.73_m2} — AB (ref >90)
Glucose: 121 mg/dl (ref 70–140)
Potassium: 3.6 mEq/L (ref 3.5–5.1)
SODIUM: 137 meq/L (ref 136–145)
TOTAL PROTEIN: 7 g/dL (ref 6.4–8.3)

## 2017-02-17 LAB — CBC WITH DIFFERENTIAL/PLATELET
BASO%: 0.3 % (ref 0.0–2.0)
BASOS ABS: 0 10*3/uL (ref 0.0–0.1)
EOS%: 1.9 % (ref 0.0–7.0)
Eosinophils Absolute: 0.1 10*3/uL (ref 0.0–0.5)
HCT: 35 % (ref 34.8–46.6)
HGB: 11.7 g/dL (ref 11.6–15.9)
LYMPH%: 11.1 % — AB (ref 14.0–49.7)
MCH: 31.8 pg (ref 25.1–34.0)
MCHC: 33.4 g/dL (ref 31.5–36.0)
MCV: 95.1 fL (ref 79.5–101.0)
MONO#: 0.3 10*3/uL (ref 0.1–0.9)
MONO%: 7.2 % (ref 0.0–14.0)
NEUT#: 2.9 10*3/uL (ref 1.5–6.5)
NEUT%: 79.5 % — AB (ref 38.4–76.8)
Platelets: 246 10*3/uL (ref 145–400)
RBC: 3.68 10*6/uL — AB (ref 3.70–5.45)
RDW: 13.5 % (ref 11.2–14.5)
WBC: 3.6 10*3/uL — ABNORMAL LOW (ref 3.9–10.3)
lymph#: 0.4 10*3/uL — ABNORMAL LOW (ref 0.9–3.3)

## 2017-02-17 LAB — MAGNESIUM: MAGNESIUM: 2.4 mg/dL (ref 1.5–2.5)

## 2017-02-17 NOTE — Telephone Encounter (Signed)
Notified of results below 

## 2017-02-17 NOTE — Telephone Encounter (Signed)
-----   Message from Heath Lark, MD sent at 02/17/2017  3:15 PM EDT ----- Regarding: labs are normal Labs are OK now except for mild low WBC Liver tests are ok ----- Message ----- From: Interface, Lab In Three Zero One Sent: 02/17/2017  12:48 PM To: Heath Lark, MD

## 2017-02-18 ENCOUNTER — Telehealth: Payer: Self-pay | Admitting: Oncology

## 2017-02-18 ENCOUNTER — Other Ambulatory Visit: Payer: Self-pay | Admitting: Radiation Oncology

## 2017-02-18 ENCOUNTER — Encounter: Payer: Self-pay | Admitting: Oncology

## 2017-02-18 NOTE — Telephone Encounter (Addendum)
Called [name] to see if she is out of pain medication as a refill request was emailed.  She said she had enough to last until Monday.  She also mentioned that she is still having lower back pain and some groin pain. Advised her that we will call when the prescription is available for pick up and that Dr. Sondra Come will be notified about the pain she is having.

## 2017-02-21 ENCOUNTER — Other Ambulatory Visit: Payer: Self-pay | Admitting: Radiation Oncology

## 2017-02-21 DIAGNOSIS — C530 Malignant neoplasm of endocervix: Secondary | ICD-10-CM

## 2017-02-21 MED ORDER — MORPHINE SULFATE 15 MG PO TABS: 15.0000 mg | ORAL_TABLET | Freq: Four times a day (QID) | ORAL | 0 refills | Status: DC | PRN

## 2017-02-25 ENCOUNTER — Ambulatory Visit: Payer: Medicaid Other | Attending: Gynecology | Admitting: Gynecology

## 2017-02-25 ENCOUNTER — Other Ambulatory Visit: Payer: Self-pay | Admitting: Radiation Oncology

## 2017-02-25 ENCOUNTER — Encounter: Payer: Self-pay | Admitting: Gynecology

## 2017-02-25 ENCOUNTER — Other Ambulatory Visit (HOSPITAL_COMMUNITY)
Admission: RE | Admit: 2017-02-25 | Discharge: 2017-02-25 | Disposition: A | Discharge status: Home / Self Care | Payer: Medicaid Other | Source: Ambulatory Visit | Attending: Gynecology | Admitting: Gynecology

## 2017-02-25 VITALS — BP 106/63 | HR 82 | Temp 97.7°F | Resp 16 | Wt 151.0 lb

## 2017-02-25 DIAGNOSIS — Z9889 Other specified postprocedural states: Secondary | ICD-10-CM | POA: Insufficient documentation

## 2017-02-25 DIAGNOSIS — Z79899 Other long term (current) drug therapy: Secondary | ICD-10-CM | POA: Insufficient documentation

## 2017-02-25 DIAGNOSIS — Z923 Personal history of irradiation: Secondary | ICD-10-CM | POA: Diagnosis not present

## 2017-02-25 DIAGNOSIS — Z08 Encounter for follow-up examination after completed treatment for malignant neoplasm: Secondary | ICD-10-CM | POA: Insufficient documentation

## 2017-02-25 DIAGNOSIS — F419 Anxiety disorder, unspecified: Secondary | ICD-10-CM | POA: Diagnosis not present

## 2017-02-25 DIAGNOSIS — Z880 Allergy status to penicillin: Secondary | ICD-10-CM | POA: Insufficient documentation

## 2017-02-25 DIAGNOSIS — F17210 Nicotine dependence, cigarettes, uncomplicated: Secondary | ICD-10-CM | POA: Insufficient documentation

## 2017-02-25 DIAGNOSIS — Z8541 Personal history of malignant neoplasm of cervix uteri: Secondary | ICD-10-CM | POA: Diagnosis not present

## 2017-02-25 DIAGNOSIS — Z9221 Personal history of antineoplastic chemotherapy: Secondary | ICD-10-CM | POA: Diagnosis not present

## 2017-02-25 DIAGNOSIS — Z01411 Encounter for gynecological examination (general) (routine) with abnormal findings: Secondary | ICD-10-CM | POA: Diagnosis not present

## 2017-02-25 DIAGNOSIS — F329 Major depressive disorder, single episode, unspecified: Secondary | ICD-10-CM | POA: Diagnosis not present

## 2017-02-25 DIAGNOSIS — C539 Malignant neoplasm of cervix uteri, unspecified: Secondary | ICD-10-CM

## 2017-02-25 DIAGNOSIS — C530 Malignant neoplasm of endocervix: Secondary | ICD-10-CM

## 2017-02-25 NOTE — Addendum Note (Signed)
Addended by: Joylene John D on: 02/25/2017 04:01 PM   Modules accepted: Orders

## 2017-02-25 NOTE — Patient Instructions (Signed)
We will call you with the results of your pap smear from today.  Plan to follow up in three months or sooner if needed.

## 2017-02-25 NOTE — Progress Notes (Signed)
Consult Note: Gyn-Onc   [place] 31 y.o. female  Chief Complaint  Patient presents with  . 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 Patient's clinically free of disease.  Plan:  Pap smears obtained. The patient turned to see me in 3 months.  Interval history: Patient returns today for her first visit following completion of radiation therapy for advanced cervical cancer. She seems to tolerate the radiation and chemotherapy reasonably well with some diarrhea. Otherwise her energy level is good and she has no GI GU or pelvic symptoms. She denies any vaginal bleeding. She comes accompanied by her mother her aunt and  HPI: 44 friend.-year-old white single female seen in consultation at the request of Dr. Aletha Halim and Dr.Carolyn Harraway-Smith regarding management of a newly diagnosed adenocarcinoma of the cervix. The patient reports she is not had Pap smears in the "many years". She presented initially with flank pain to the emergency room on 07/29/2016. She was treated for a UTI and had resolution of her flank pain although she continued to have suprapubic pain. She has had irregular bleeding and a copious discharge for several months. Ultimately she had a Pap smear showing atypical glandular cells and a cervical biopsy on 09/01/2016 revealing invasive adenocarcinoma of the cervix. The patient denies any weight loss or other constitutional symptoms.  CT scan revealed pelvic and super clavicular adenopathy. Patient received radiation therapy with concurrent cisplatin completed in 01/26/2017.  Review of Systems:10 point review of systems is negative except as noted in interval history.   Vitals: Blood pressure 106/63, pulse 82, temperature 97.7 F (36.5 C), temperature source Oral, resp. rate 16, weight 151 lb (68.5 kg), SpO2 98 %.  Physical Exam: General : The patient is a healthy woman in no acute distress.   HEENT: normocephalic, extraoccular movements normal; neck is supple without thyromegally  Lynphnodes: There is one 1 cm left inguinal lymph node. Abdomen: Soft, non-tender, no ascites, no organomegally, no masses, no hernias  Pelvic:  EGBUS: Normal female  Vagina: Normal, no lesions  Urethra and Bladder: Normal, non-tender  Cervix flush with the vaginal vault. No lesions are noted. Uterus: Difficult to fully outline because of pain on palpation Bi-manual examination: Non-tender; no adenxal masses or nodularity,   Rectal: normal sphincter tone, no masses, no blood  Lower extremities: No edema or varicosities. Normal range of motion      Allergies  Allergen Reactions  . Amoxicillin Hives  . Penicillins Other (See Comments)    UNKNOWN CHILDHOOD REACTION    Past Medical History:  Diagnosis Date  . Anxiety   . Cervical cancer [facility]) oncologist-  dr gorsuch/  dr Sondra Come   dx 09-01-2016-- Stage IB2--  Invasive cervix adenocarcinoma-- completed chemotherapy (11-09-2016 to 12-08-2016) concurrent w/ external beam radiation (completed 12-22-2016))  . Depression   . History of cancer chemotherapy 11-09-2016 to 12-08-2016  . History of radiation therapy 11-09-2016  to 12-22-2016   external beam radiation to pelvis for cervical cancer  . Leukopenia due to antineoplastic chemotherapy [facility])     Past Surgical History:  Procedure Laterality Date  . CYST REMOVAL HAND  age 20  . IR FLUORO GUIDE PORT INSERTION RIGHT  10/29/2016  . IR US GUIDE VASC ACCESS RIGHT  10/29/2016  . TANDEM RING INSERTION N/A 01/04/2017   Procedure: TANDEM RING INSERTION;  Surgeon: Gery Pray, MD;  Location: [facility];  Service: Urology;  Laterality: N/A;  . TANDEM  RING INSERTION N/A 01/10/2017   Procedure: TANDEM RING INSERTION;  Surgeon: Gery Pray, MD;  Location: [facility];  Service: Urology;  Laterality: N/A;  . TANDEM RING INSERTION N/A 01/17/2017   Procedure: TANDEM RING  INSERTION;  Surgeon: Gery Pray, MD;  Location: [facility];  Service: Urology;  Laterality: N/A;  . TANDEM RING INSERTION N/A 01/24/2017   Procedure: TANDEM RING INSERTION;  Surgeon: Gery Pray, MD;  Location: [facility];  Service: Urology;  Laterality: N/A;  . TANDEM RING INSERTION N/A 01/26/2017   Procedure: TANDEM RING INSERTION;  Surgeon: Gery Pray, MD;  Location: [facility];  Service: Urology;  Laterality: N/A;  . WISDOM TOOTH EXTRACTION     times 4    Current Outpatient Prescriptions  Medication Sig Dispense Refill  . buPROPion (WELLBUTRIN) 100 MG tablet Take 1 tablet (100 mg total) by mouth 2 (two) times daily. 60 tablet 1  . hyaluronate sodium (RADIAPLEXRX) GEL Apply 1 application topically 2 (two) times daily as needed.     Marland Kitchen levofloxacin (LEVAQUIN) 500 MG tablet Take 1 tablet (500 mg total) by mouth daily. (Patient taking differently: Take 500 mg by mouth as directed. Per pt as directed if gets fever and if feel like getting a cold) 10 tablet 0  . LORazepam (ATIVAN) 0.5 MG tablet Take 1 tablet (0.5 mg total) by mouth every 8 (eight) hours. As Needed for anxiety or nausea 60 tablet 0  . morphine (MSIR) 15 MG tablet Take 1 tablet (15 mg total) by mouth every 6 (six) hours as needed for severe pain. 60 tablet 0  . ondansetron (ZOFRAN) 8 MG tablet Take 1 tablet (8 mg total) by mouth every 8 (eight) hours as needed. 60 tablet 3  . prochlorperazine (COMPAZINE) 10 MG tablet Take 1 tablet (10 mg total) by mouth every 6 (six) hours as needed (Nausea or vomiting). 60 tablet 3  . silver sulfADIAZINE (SILVADENE) 1 % cream Apply 1 application topically as needed.      No current facility-administered medications for this visit.     Social History   Social History  . Marital status: Single    Spouse name: N/A  . Number of children: 0  . Years of education: N/A   Occupational History  . Not on file.   Social History Main Topics  .  Smoking status: Current Every Day Smoker    Packs/day: 0.25    Years: 12.00    Types: Cigarettes  . Smokeless tobacco: Never Used     Comment: 10 cig per day  . Alcohol use No  . Drug use: No  . Sexual activity: Yes    Birth control/ protection: None   Other Topics Concern  . Not on file   Social History Narrative  . No narrative on file    Family History  Problem Relation Age of Onset  . Diabetes Maternal Grandmother   . Breast cancer Paternal Grandmother       [name], MD 02/25/2017, 3:29 PM

## 2017-03-01 ENCOUNTER — Telehealth: Payer: Self-pay | Admitting: *Deleted

## 2017-03-01 ENCOUNTER — Other Ambulatory Visit: Payer: Self-pay | Admitting: Hematology and Oncology

## 2017-03-01 DIAGNOSIS — C530 Malignant neoplasm of endocervix: Secondary | ICD-10-CM

## 2017-03-01 MED ORDER — LORAZEPAM 0.5 MG PO TABS: 0.5000 mg | ORAL_TABLET | Freq: Three times a day (TID) | ORAL | 0 refills | Status: DC

## 2017-03-01 NOTE — Telephone Encounter (Signed)
-----   Message from Heath Lark, MD sent at 03/01/2017  3:53 PM EDT ----- Regarding: RE: port removal Thanks, I will place orders Noelie Renfrow, please let her know I placed order for port to be removed ----- Message ----- From: Dorothyann Gibbs, NP Sent: 03/01/2017   3:33 PM To: Heath Lark, MD Subject: RE: port removal                               Per Dr. Fermin Schwab:  I think it is fine to remove her port  ----- Message ----- From: Heath Lark, MD Sent: 03/01/2017   7:27 AM To: [name], MD, Dorothyann Gibbs, NP Subject: port removal                                   Hi,  Just want to touch base with you whether it is appropriate to recommend port removal? Please let me know and I will place orders

## 2017-03-01 NOTE — Telephone Encounter (Signed)
Left message with note below 

## 2017-03-02 ENCOUNTER — Encounter: Payer: Self-pay | Admitting: Oncology

## 2017-03-03 ENCOUNTER — Telehealth: Payer: Self-pay | Admitting: Oncology

## 2017-03-03 ENCOUNTER — Ambulatory Visit
Admission: RE | Admit: 2017-03-03 | Discharge: 2017-03-03 | Disposition: A | Discharge status: Home / Self Care | Payer: Medicaid Other | Source: Ambulatory Visit | Attending: Radiation Oncology | Admitting: Radiation Oncology

## 2017-03-03 ENCOUNTER — Encounter: Payer: Self-pay | Admitting: Radiation Oncology

## 2017-03-03 VITALS — BP 114/93 | HR 94 | Temp 97.9°F | Ht 62.0 in | Wt 150.6 lb

## 2017-03-03 DIAGNOSIS — C530 Malignant neoplasm of endocervix: Secondary | ICD-10-CM | POA: Diagnosis not present

## 2017-03-03 LAB — CYTOLOGY - PAP
Diagnosis: NEGATIVE
Diagnosis: REACTIVE

## 2017-03-03 MED ORDER — MORPHINE SULFATE 15 MG PO TABS: 15.0000 mg | ORAL_TABLET | Freq: Four times a day (QID) | ORAL | 0 refills | Status: DC | PRN

## 2017-03-03 NOTE — Progress Notes (Signed)
Radiation Oncology         [phone]) [phone] ________________________________  Name: Joanne Griffin MRN: [id number]  Date: 03/03/2017  DOB: July 02, 1985  Follow-Up Visit Note  CC: Patient, No Pcp Per  [name],*    ICD-10-CM   1. Malignant neoplasm of endocervix ([place]) C53.0 NM PET Image Restag (PS) Skull Base To Thigh    Diagnosis:   31 y.o. woman with invasive adenocarcinoma of the cervix, clinical stage IB2 adenocarcinoma cervix with radiographically suspicious pelvic lymphadenopathy and left supraclavicular adenopathy.   Interval Since Last Radiation:  1 months  11/09/2016 - 12/23/2016 and HDR on 01/04/2017, 01/10/2017, 01/17/2017, 01/24/2017, 01/26/2017. Site/dose:    1) The cervix was treated to 45 Gy in 25 fractions,   2) The pelvis was treated to 9 Gy in 5 fractions as a boost to the suspicious pelvic  nodes. (54 Gy) 3) Sclav-LT was initially prescribed to 60 Gy in 30 fractions, but the patient discontinued at fraction 28 due to severe skin reactions. (56 Gy) 4) The cervix was also treated to 27.5 Gy in 5 fractions of 5.5 Gy.    Narrative:  The patient returns today for routine follow-up. Joanne Griffin is here for follow up.  She reports having pain in her lower abdomen that comes and goes.  She rates it at a 5/10.  She takes Morphine 15 mg q 5 hours.  She said they feel like menstrual cramps.  She denies having any urinary issues.  She reports having constipation.  Her last BM was this morning.  She reports having nausea especially in the mornings.  She reports she has been vomiting mucus which is frothy in the mornings.  She is taking Ativan, zofran and compazine.  She denies having any vaginal bleeding or discharge.  She reports her energy level is improving.  She has been given size S+ and M vaginal dilators and instructions on their use                            ALLERGIES:  is allergic to amoxicillin and penicillins.  Meds: Current Outpatient Prescriptions  Medication  Sig Dispense Refill  . buPROPion (WELLBUTRIN) 100 MG tablet Take 1 tablet (100 mg total) by mouth 2 (two) times daily. 60 tablet 1  . LORazepam (ATIVAN) 0.5 MG tablet Take 1 tablet (0.5 mg total) by mouth every 8 (eight) hours. As Needed for anxiety or nausea 60 tablet 0  . morphine (MSIR) 15 MG tablet Take 1 tablet (15 mg total) by mouth every 6 (six) hours as needed for severe pain. 60 tablet 0  . ondansetron (ZOFRAN) 8 MG tablet Take 1 tablet (8 mg total) by mouth every 8 (eight) hours as needed. 60 tablet 3  . prochlorperazine (COMPAZINE) 10 MG tablet Take 1 tablet (10 mg total) by mouth every 6 (six) hours as needed (Nausea or vomiting). 60 tablet 3   No current facility-administered medications for this encounter.     Physical Findings: The patient is in no acute distress. Patient is alert and oriented. Accompanied by her mother on evaluation today  height is 5\' 2"  (1.575 m) and weight is 150 lb 9.6 oz (68.3 kg). Her oral temperature is 97.9 F (36.6 C). Her blood pressure is 114/93 (abnormal) and her pulse is 94. Her oxygen saturation is 100%. .  No significant changes.  Lungs are clear to auscultation bilaterally. Heart has regular rate and rhythm. No palpable cervical, supraclavicular, or axillary adenopathy.  Abdomen soft, non-tender, normal bowel sounds.  Hyperpigmentation changes in the  supraclavicular region. No residual palpable adenopathy in this area. Pelvic exam deferred in light of recent exam by Dr. Fermin Schwab. Patient was noted to be in clinical remission by his exam in late August  Lab Findings: Lab Results  Component Value Date   WBC 3.6 (L) 02/17/2017   HGB 11.7 02/17/2017   HCT 35.0 02/17/2017   MCV 95.1 02/17/2017   PLT 246 02/17/2017    Radiographic Findings: No results found.  Impression:  The patient is recovering from the effects of radiation.  Patient continues have some pain but this overall seems to be somewhat better. I'm encouraged her to begin  tapering her morphine as tolerated.  Plan: Patient will see Dr. Fermin Schwab and Dr. Alvy Bimler in November. I Will order a post treatment PET scan, prior to Dr. Mora Bellman follow up on November 2. Routine follow up at Radiation Oncology in February 2019. PET scan has been ordered to occur on November 1 which will be approximately 3 months out from her radiation therapy.  ___________________________________ -----------------------------------  Blair Promise, PhD, MD  This document serves as a record of services personally performed by Gery Pray MD. It was created on his behalf by Delton Coombes, a trained medical scribe. The creation of this record is based on the scribe's personal observations and the provider's statements to them. This document has been checked and approved by the attending provider.

## 2017-03-03 NOTE — Progress Notes (Signed)
  Home Care Instructions for the Insertion and Care of Your Vaginal Dilator  Why Do I Need a Vaginal Dilator?  Internal radiation therapy may cause scar tissue to form at the top of your vagina (vaginal cuff).  This may make vaginal examinations difficult in the future. You can prevent scar tissue from forming by using a vaginal dilator (a smooth plastic rod), and/or by having regular sexual intercourse.  If not using the dilator you should be having intercourse two or three times a week.  If you are unable to have intercourse, you should use your vaginal dilator.  You may have some spotting or bleeding from your dilator or intercourse the first few times. You may also have some discomfort. If discomfort occurs with intercourse, you and your partner may need to stop for a while and try again later.  How to Use Your Vaginal Dilator  - Wash the dilator with soap and water before and after each use. - Check the dilator to be sure it is smooth. Do not use the dilator if you find any roughspots. - Coat the dilator with K-Y Jelly, Astroglide, or Replens. Do not use Vaseline, baby oil, or other oil based lubricants. They are not water-soluble and can be irritating to the tissues in the vagina. - Lie on your back with your knees bent and legs apart. - Insert the rounded end of the dilator into your vagina as far as it will go without causing pain or discomfort. - Close your knees and slowly straighten your legs. - Keep the dilator in your vagina for about 10 to 15 minutes.  Please use it 3 times a week, for example, Monday, Wednesday and Friday evenings. [facility] your knees, open your legs, and gently remove the dilator. - Gently cleanse the skin around the vaginal opening. - Wash the dilator after each use. -  It is important that you use the dilator routinely until instructed otherwise by your doctor.

## 2017-03-03 NOTE — Telephone Encounter (Signed)
Called patient and advised her that her port flush was canceled because Dr. Alvy Bimler has placed an order to remove her port a cath.  Advised her to call Dr. Philis Nettle nurse to discuss having it removed.  She verbalized understanding and agreement.

## 2017-03-03 NOTE — Progress Notes (Signed)
Joanne Griffin is here for follow up.  She reports having pain in her lower abdomen that comes and goes.  She rates it at a 5/10.  She takes Morphine 15 mg q 5 hours.  She said they feel like menstrual cramps.  She denies having any urinary issues.  She reports having constipation.  Her last BM was this morning.  She reports having nausea especially in the mornings.  She reports she has been vomiting mucus which is frothy in the mornings.  She is taking Ativan, zofran and compazine.  She denies having any vaginal bleeding or discharge.  She reports her energy level is improving.  She has been size S+ and M vaginal dilators.  BP (!) 114/93 (BP Location: Right Arm, Patient Position: Sitting)   Pulse 94   Temp 97.9 F (36.6 C) (Oral)   Ht 5\' 2"  (1.575 m)   Wt 150 lb 9.6 oz (68.3 kg)   SpO2 100%   BMI 27.55 kg/m   Wt Readings from Last 3 Encounters:  03/03/17 150 lb 9.6 oz (68.3 kg)  02/25/17 151 lb (68.5 kg)  01/28/17 151 lb 4.8 oz (68.6 kg)

## 2017-03-08 ENCOUNTER — Encounter: Payer: Self-pay | Admitting: Radiation Oncology

## 2017-03-09 ENCOUNTER — Telehealth: Payer: Self-pay | Admitting: Oncology

## 2017-03-09 NOTE — Telephone Encounter (Addendum)
Called Joanne Griffin and notified her that a PET scan has been ordered but needs to be pre-certed first before it can be scheduled.  Let her know that we will call her as soon as it is scheduled.

## 2017-03-14 ENCOUNTER — Other Ambulatory Visit: Payer: Self-pay | Admitting: Student

## 2017-03-15 ENCOUNTER — Ambulatory Visit (HOSPITAL_COMMUNITY)
Admission: RE | Admit: 2017-03-15 | Discharge: 2017-03-15 | Disposition: A | Discharge status: Home / Self Care | Payer: Medicaid Other | Source: Ambulatory Visit | Attending: Hematology and Oncology | Admitting: Hematology and Oncology

## 2017-03-15 ENCOUNTER — Encounter (HOSPITAL_COMMUNITY): Payer: Self-pay

## 2017-03-15 DIAGNOSIS — Z9221 Personal history of antineoplastic chemotherapy: Secondary | ICD-10-CM | POA: Insufficient documentation

## 2017-03-15 DIAGNOSIS — C539 Malignant neoplasm of cervix uteri, unspecified: Secondary | ICD-10-CM

## 2017-03-15 DIAGNOSIS — Z923 Personal history of irradiation: Secondary | ICD-10-CM | POA: Insufficient documentation

## 2017-03-15 DIAGNOSIS — Z452 Encounter for adjustment and management of vascular access device: Secondary | ICD-10-CM | POA: Diagnosis not present

## 2017-03-15 DIAGNOSIS — C530 Malignant neoplasm of endocervix: Secondary | ICD-10-CM

## 2017-03-15 DIAGNOSIS — F17210 Nicotine dependence, cigarettes, uncomplicated: Secondary | ICD-10-CM | POA: Insufficient documentation

## 2017-03-15 DIAGNOSIS — Z8541 Personal history of malignant neoplasm of cervix uteri: Secondary | ICD-10-CM | POA: Insufficient documentation

## 2017-03-15 HISTORY — PX: IR REMOVAL TUN ACCESS W/ PORT W/O FL MOD SED: IMG2290

## 2017-03-15 LAB — CBC
HEMATOCRIT: 38.7 % (ref 36.0–46.0)
HEMOGLOBIN: 13.1 g/dL (ref 12.0–15.0)
MCH: 30.8 pg (ref 26.0–34.0)
MCHC: 33.9 g/dL (ref 30.0–36.0)
MCV: 91.1 fL (ref 78.0–100.0)
Platelets: 306 10*3/uL (ref 150–400)
RBC: 4.25 MIL/uL (ref 3.87–5.11)
RDW: 12.2 % (ref 11.5–15.5)
WBC: 4.6 10*3/uL (ref 4.0–10.5)

## 2017-03-15 LAB — APTT: aPTT: 29 seconds (ref 24–36)

## 2017-03-15 LAB — PROTIME-INR
INR: 0.97
Prothrombin Time: 12.8 seconds (ref 11.4–15.2)

## 2017-03-15 MED ORDER — MIDAZOLAM HCL 2 MG/2ML IJ SOLN
INTRAMUSCULAR | Status: AC | PRN
  Administered 2017-03-15 (×6): 1 mg via INTRAVENOUS

## 2017-03-15 MED ORDER — VANCOMYCIN HCL IN DEXTROSE 1-5 GM/200ML-% IV SOLN
1000.0000 mg | Freq: Once | INTRAVENOUS | Status: AC
  Administered 2017-03-15: 1000 mg via INTRAVENOUS

## 2017-03-15 MED ORDER — FENTANYL CITRATE (PF) 100 MCG/2ML IJ SOLN
INTRAMUSCULAR | Status: AC
Start: 2017-03-15 — End: 2017-03-16
  Filled 2017-03-15: qty 4

## 2017-03-15 MED ORDER — SODIUM CHLORIDE 0.9 % IV SOLN
INTRAVENOUS | Status: DC
  Administered 2017-03-15: 13:00:00 via INTRAVENOUS

## 2017-03-15 MED ORDER — LIDOCAINE HCL (PF) 2 % IJ SOLN
INTRAMUSCULAR | Status: AC
  Filled 2017-03-15: qty 10

## 2017-03-15 MED ORDER — MIDAZOLAM HCL 2 MG/2ML IJ SOLN
INTRAMUSCULAR | Status: AC
  Filled 2017-03-15: qty 4

## 2017-03-15 MED ORDER — VANCOMYCIN HCL IN DEXTROSE 1-5 GM/200ML-% IV SOLN
INTRAVENOUS | Status: AC
  Filled 2017-03-15: qty 200

## 2017-03-15 MED ORDER — DIPHENHYDRAMINE HCL 50 MG/ML IJ SOLN
25.0000 mg | Freq: Once | INTRAMUSCULAR | Status: AC
  Administered 2017-03-15: 25 mg via INTRAVENOUS
  Filled 2017-03-15: qty 1

## 2017-03-15 MED ORDER — FENTANYL CITRATE (PF) 100 MCG/2ML IJ SOLN
INTRAMUSCULAR | Status: AC | PRN
  Administered 2017-03-15 (×4): 50 ug via INTRAVENOUS

## 2017-03-15 NOTE — Sedation Documentation (Signed)
Patient denies pain and is resting comfortably.  

## 2017-03-15 NOTE — Discharge Instructions (Signed)
Implanted Port Removal, Care After °Refer to this sheet in the next few weeks. These instructions provide you with information about caring for yourself after your procedure. Your health care provider may also give you more specific instructions. Your treatment has been planned according to current medical practices, but problems sometimes occur. Call your health care provider if you have any problems or questions after your procedure. °What can I expect after the procedure? °After the procedure, it is common to have: °· Soreness or pain near your incision. °· Some swelling or bruising near your incision. ° °Follow these instructions at home: °Medicines °· Take over-the-counter and prescription medicines only as told by your health care provider. °· If you were prescribed an antibiotic medicine, take it as told by your health care provider. Do not stop taking the antibiotic even if you start to feel better. °Bathing °· Do not take baths, swim, or use a hot tub until your health care provider approves. Ask your health care provider if you can take showers. You may only be allowed to take sponge baths for bathing. °Incision care °· Follow instructions from your health care provider about how to take care of your incision. Make sure you: °? Wash your hands with soap and water before you change your bandage (dressing). If soap and water are not available, use hand sanitizer. °? Change your dressing as told by your health care provider. °? Keep your dressing dry. °? Leave stitches (sutures), skin glue, or adhesive strips in place. These skin closures may need to stay in place for 2 weeks or longer. If adhesive strip edges start to loosen and curl up, you may trim the loose edges. Do not remove adhesive strips completely unless your health care provider tells you to do that. °· Check your incision area every day for signs of infection. Check for: °? More redness, swelling, or pain. °? More fluid or  blood. °? Warmth. °? Pus or a bad smell. °Driving °· If you received a sedative, do not drive for 24 hours after the procedure. °· If you did not receive a sedative, ask your health care provider when it is safe to drive. °Activity °· Return to your normal activities as told by your health care provider. Ask your health care provider what activities are safe for you. °· Until your health care provider says it is safe: °? Do not lift anything that is heavier than 10 lb (4.5 kg). °? Do not do activities that involve lifting your arms over your head. °General instructions °· Do not use any tobacco products, such as cigarettes, chewing tobacco, and e-cigarettes. Tobacco can delay healing. If you need help quitting, ask your health care provider. °· Keep all follow-up visits as told by your health care provider. This is important. °Contact a health care provider if: °· You have more redness, swelling, or pain around your incision. °· You have more fluid or blood coming from your incision. °· Your incision feels warm to the touch. °· You have pus or a bad smell coming from your incision. °· You have a fever. °· You have pain that is not relieved by your pain medicine. °Get help right away if: °· You have chest pain. °· You have difficulty breathing. °This information is not intended to replace advice given to you by your health care provider. Make sure you discuss any questions you have with your health care provider. °Document Released: 05/26/2015 Document Revised: 11/20/2015 Document Reviewed: 03/19/2015 °Elsevier Interactive Patient   Education © 2018 [employer] °Moderate Conscious Sedation, Adult, Care After °These instructions provide you with information about caring for yourself after your procedure. Your health care provider may also give you more specific instructions. Your treatment has been planned according to current medical practices, but problems sometimes occur. Call your health care provider if you have  any problems or questions after your procedure. °What can I expect after the procedure? °After your procedure, it is common: °· To feel sleepy for several hours. °· To feel clumsy and have poor balance for several hours. °· To have poor judgment for several hours. °· To vomit if you eat too soon. ° °Follow these instructions at home: °For at least 24 hours after the procedure: ° °· Do not: °? Participate in activities where you could fall or become injured. °? Drive. °? Use heavy machinery. °? Drink alcohol. °? Take sleeping pills or medicines that cause drowsiness. °? Make important decisions or sign legal documents. °? Take care of children on your own. °· Rest. °Eating and drinking °· Follow the diet recommended by your health care provider. °· If you vomit: °? Drink water, juice, or soup when you can drink without vomiting. °? Make sure you have little or no nausea before eating solid foods. °General instructions °· Have a responsible adult stay with you until you are awake and alert. °· Take over-the-counter and prescription medicines only as told by your health care provider. °· If you smoke, do not smoke without supervision. °· Keep all follow-up visits as told by your health care provider. This is important. °Contact a health care provider if: °· You keep feeling nauseous or you keep vomiting. °· You feel light-headed. °· You develop a rash. °· You have a fever. °Get help right away if: °· You have trouble breathing. °This information is not intended to replace advice given to you by your health care provider. Make sure you discuss any questions you have with your health care provider. °Document Released: 04/04/2013 Document Revised: 11/17/2015 Document Reviewed: 10/04/2015 °Elsevier Interactive Patient Education © 2018 Elsevier Inc. ° °

## 2017-03-15 NOTE — Procedures (Signed)
Interventional Radiology Procedure Note  Procedure: Port removal  Complications: None  Estimated Blood Loss: < 10 mL  Right chest port removed without complication.  Wound closed.  Joanne Griffin. Kathlene Cote, M.D Pager:  [phone]

## 2017-03-15 NOTE — H&P (Signed)
Chief Complaint: cervical cancer  Referring Physician:Dr. Heath Lark  Supervising Physician: Aletta Edouard  Patient Status: [facility] - Out-pt  HPI: Joanne Griffin is a 31 y.o. female with a history of cervical cancer.  She had a port a cath placed in May of 2018.  She has since undergone chemotherapy.  She has completed her treatments and now presents for removal of her PAC.  She has no complaints and otherwise feels well.  Past Medical History:  Past Medical History:  Diagnosis Date  . Anxiety   . Cervical cancer [facility]) oncologist-  dr gorsuch/  dr Sondra Come   dx 09-01-2016-- Stage IB2--  Invasive cervix adenocarcinoma-- completed chemotherapy (11-09-2016 to 12-08-2016) concurrent w/ external beam radiation (completed 12-22-2016))  . Depression   . History of cancer chemotherapy 11-09-2016 to 12-08-2016  . History of radiation therapy 11-09-2016  to 12-22-2016   cervix was treated to 45 Gy, pelvis was treated to 9 Gy, Sclav left was treated to 56 Gy.  Marland Kitchen History of radiation therapy 01/04/17, 01/10/17, 01/17/17, 01/24/17, 01/26/17   cervix was treated to 27.5 Gy in 5 fractions  . Leukopenia due to antineoplastic chemotherapy [facility])     Past Surgical History:  Past Surgical History:  Procedure Laterality Date  . CYST REMOVAL HAND  age 89  . IR FLUORO GUIDE PORT INSERTION RIGHT  10/29/2016  . IR US GUIDE VASC ACCESS RIGHT  10/29/2016  . TANDEM RING INSERTION N/A 01/04/2017   Procedure: TANDEM RING INSERTION;  Surgeon: Gery Pray, MD;  Location: [facility];  Service: Urology;  Laterality: N/A;  . TANDEM RING INSERTION N/A 01/10/2017   Procedure: TANDEM RING INSERTION;  Surgeon: Gery Pray, MD;  Location: [facility];  Service: Urology;  Laterality: N/A;  . TANDEM RING INSERTION N/A 01/17/2017   Procedure: TANDEM RING INSERTION;  Surgeon: Gery Pray, MD;  Location: [facility];  Service: Urology;  Laterality: N/A;  . TANDEM RING INSERTION N/A  01/24/2017   Procedure: TANDEM RING INSERTION;  Surgeon: Gery Pray, MD;  Location[facility];  Service: Urology;  Laterality: N/A;  . TANDEM RING INSERTION N/A 01/26/2017   Procedure: TANDEM RING INSERTION;  Surgeon: Gery Pray, MD;  Location: [facility];  Service: Urology;  Laterality: N/A;  . WISDOM TOOTH EXTRACTION     times 4    Family History:  Family History  Problem Relation Age of Onset  . Diabetes Maternal Grandmother   . Breast cancer Paternal Grandmother     Social History:  reports that she has been smoking Cigarettes.  She has a 3.00 pack-year smoking history. She has never used smokeless tobacco. She reports that she does not drink alcohol or use drugs.  Allergies:  Allergies  Allergen Reactions  . Amoxicillin Hives  . Penicillins Other (See Comments)    UNKNOWN CHILDHOOD REACTION    Medications: Medications reviewed in epic  Please HPI for pertinent positives, otherwise complete 10 system ROS negative.  Mallampati Score: MD Evaluation Airway: WNL Heart: WNL Abdomen: WNL Chest/ Lungs: WNL ASA  Classification: 2 Mallampati/Airway Score: One  Physical Exam: BP 117/82 (BP Location: Left Arm)   Pulse 82   Temp 97.7 F (36.5 C) (Oral)   Resp 16   Ht 5\' 2"  (1.575 m)   Wt 149 lb 9.6 oz (67.9 kg)   SpO2 98%   BMI 27.36 kg/m  Body mass index is 27.36 kg/m. General: pleasant, WD, WN white female who is  laying in bed in NAD HEENT: head is normocephalic, atraumatic.  Sclera are noninjected.  PERRL.  Ears and nose without any masses or lesions.  Mouth is pink and moist Heart: regular, rate, and rhythm.  Normal s1,s2. No obvious murmurs, gallops, or rubs noted.  Palpable radial and pedal pulses bilaterally Lungs: CTAB, no wheezes, rhonchi, or rales noted.  Respiratory effort nonlabored.  PAC in RUC Abd: soft, NT, ND, +BS, no masses, hernias, or organomegaly Psych: A&Ox3 with an appropriate  affect.   Labs: pending  Imaging: No results found.  Assessment/Plan 1. Cervical cancer  We will plan to proceed with port removal today.  Her labs are currently pending, but her vitals have been reviewed.  The procedure including risks and complications such as bleeding and infection were discussed with the patient.  She understands and is agreeable to proceed.  Procedure consent has been signed and is in the chart.  Thank you for this interesting consult.  I greatly enjoyed meeting [place] and look forward to participating in their care.  A copy of this report was sent to the requesting provider on this date.  Electronically Signed: Henreitta Cea 03/15/2017, 2:09 PM   I spent a total of    25 Minutes in face to face in clinical consultation, greater than 50% of which was counseling/coordinating care for cervical cancer

## 2017-03-16 ENCOUNTER — Telehealth: Payer: Self-pay

## 2017-03-16 ENCOUNTER — Other Ambulatory Visit: Payer: Self-pay | Admitting: Hematology and Oncology

## 2017-03-16 NOTE — Telephone Encounter (Signed)
-----   Message from Heath Lark, MD sent at 03/16/2017  8:21 AM EDT ----- Regarding: cancel appt to see me in Nov Pls call her. Since her labs are all normal, pap normal and port is removed, I recommend cancelling her appt to see me in Nov. She can just follow with Rad onc and GYN only If OK with her proceed to cancel  Thanks

## 2017-03-16 NOTE — Telephone Encounter (Signed)
Called with below message. Okay to cancel appointment.

## 2017-03-24 ENCOUNTER — Other Ambulatory Visit: Payer: Self-pay | Admitting: Radiation Oncology

## 2017-03-24 DIAGNOSIS — C530 Malignant neoplasm of endocervix: Secondary | ICD-10-CM

## 2017-03-28 ENCOUNTER — Other Ambulatory Visit: Payer: Self-pay | Admitting: Radiation Oncology

## 2017-03-28 DIAGNOSIS — C530 Malignant neoplasm of endocervix: Secondary | ICD-10-CM

## 2017-03-28 MED ORDER — LORAZEPAM 0.5 MG PO TABS: 0.5000 mg | ORAL_TABLET | Freq: Three times a day (TID) | ORAL | 0 refills | Status: DC

## 2017-03-28 MED ORDER — MORPHINE SULFATE 15 MG PO TABS: 15.0000 mg | ORAL_TABLET | Freq: Four times a day (QID) | ORAL | 0 refills | Status: DC | PRN

## 2017-03-29 ENCOUNTER — Encounter: Payer: Self-pay | Admitting: Oncology

## 2017-03-29 ENCOUNTER — Encounter (HOSPITAL_COMMUNITY): Payer: Self-pay | Admitting: Psychiatry

## 2017-03-29 ENCOUNTER — Ambulatory Visit (INDEPENDENT_AMBULATORY_CARE_PROVIDER_SITE_OTHER): Payer: Medicaid Other | Admitting: Psychiatry

## 2017-03-29 VITALS — BP 118/72 | HR 107 | Resp 16 | Ht 62.0 in | Wt 150.0 lb

## 2017-03-29 DIAGNOSIS — Z79899 Other long term (current) drug therapy: Secondary | ICD-10-CM

## 2017-03-29 DIAGNOSIS — Z818 Family history of other mental and behavioral disorders: Secondary | ICD-10-CM | POA: Diagnosis not present

## 2017-03-29 DIAGNOSIS — F418 Other specified anxiety disorders: Secondary | ICD-10-CM

## 2017-03-29 DIAGNOSIS — F4310 Post-traumatic stress disorder, unspecified: Secondary | ICD-10-CM

## 2017-03-29 DIAGNOSIS — F411 Generalized anxiety disorder: Secondary | ICD-10-CM | POA: Diagnosis not present

## 2017-03-29 DIAGNOSIS — F331 Major depressive disorder, recurrent, moderate: Secondary | ICD-10-CM | POA: Diagnosis not present

## 2017-03-29 DIAGNOSIS — F17210 Nicotine dependence, cigarettes, uncomplicated: Secondary | ICD-10-CM | POA: Diagnosis not present

## 2017-03-29 MED ORDER — BUSPIRONE HCL 7.5 MG PO TABS: 7.5000 mg | ORAL_TABLET | Freq: Every day | ORAL | 0 refills | Status: DC | PRN
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
## 2017-04-22 ENCOUNTER — Ambulatory Visit (HOSPITAL_COMMUNITY): Payer: Self-pay | Admitting: Licensed Clinical Social Worker

## 2017-04-27 ENCOUNTER — Ambulatory Visit (HOSPITAL_COMMUNITY): Payer: Self-pay | Admitting: Psychiatry

## 2017-04-29 ENCOUNTER — Ambulatory Visit: Payer: Medicaid Other | Attending: Gynecology | Admitting: Gynecology

## 2017-04-29 ENCOUNTER — Encounter: Payer: Self-pay | Admitting: Gynecology

## 2017-04-29 ENCOUNTER — Other Ambulatory Visit (HOSPITAL_COMMUNITY)
Admission: RE | Admit: 2017-04-29 | Discharge: 2017-04-29 | Disposition: A | Discharge status: Home / Self Care | Payer: Medicaid Other | Source: Ambulatory Visit | Attending: Gynecology | Admitting: Gynecology

## 2017-04-29 VITALS — BP 112/71 | HR 80 | Temp 97.7°F | Resp 20 | Ht 62.0 in | Wt 150.7 lb

## 2017-04-29 DIAGNOSIS — Z79899 Other long term (current) drug therapy: Secondary | ICD-10-CM | POA: Insufficient documentation

## 2017-04-29 DIAGNOSIS — F17210 Nicotine dependence, cigarettes, uncomplicated: Secondary | ICD-10-CM | POA: Insufficient documentation

## 2017-04-29 DIAGNOSIS — D701 Agranulocytosis secondary to cancer chemotherapy: Secondary | ICD-10-CM | POA: Diagnosis not present

## 2017-04-29 DIAGNOSIS — C539 Malignant neoplasm of cervix uteri, unspecified: Secondary | ICD-10-CM | POA: Diagnosis present

## 2017-04-29 DIAGNOSIS — Z8541 Personal history of malignant neoplasm of cervix uteri: Secondary | ICD-10-CM

## 2017-04-29 DIAGNOSIS — F329 Major depressive disorder, single episode, unspecified: Secondary | ICD-10-CM | POA: Diagnosis not present

## 2017-04-29 DIAGNOSIS — Z923 Personal history of irradiation: Secondary | ICD-10-CM | POA: Diagnosis not present

## 2017-04-29 DIAGNOSIS — F419 Anxiety disorder, unspecified: Secondary | ICD-10-CM | POA: Insufficient documentation

## 2017-04-29 DIAGNOSIS — Z880 Allergy status to penicillin: Secondary | ICD-10-CM | POA: Insufficient documentation

## 2017-04-29 MED ORDER — LORAZEPAM 0.5 MG PO TABS: 0.5000 mg | ORAL_TABLET | Freq: Three times a day (TID) | ORAL | 0 refills | Status: DC | PRN

## 2017-04-29 NOTE — Progress Notes (Signed)
Consult Note: Gyn-Onc   [place] 31 y.o. female  No chief complaint on file.   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 Patient's clinically free of disease.  Plan:  Pap smears obtained. The patient turned to see me in 3 months to see Dr. Sofie Hartigan and 6 months to see me..  Interval history: Patient returns today for scheduled follow-up. Since her last visit she has had no new problems. She specifically denies any pelvic pain pressure vaginal bleeding or discharge. She has occasional diarrhea. She has not noted any new adenopathy. Overall she's doing quite well.    HPI: 31 year-old white single female initially seen in consultation at the request of Dr. Aletha Halim and Dr.Carolyn Harraway-Smith regarding management of a newly diagnosed adenocarcinoma of the cervix. The patient reports she is not had Pap smears in the "many years". She presented initially with flank pain to the emergency room on 07/29/2016. She was treated for a UTI and had resolution of her flank pain although she continued to have suprapubic pain. She has had irregular bleeding and a copious discharge for several months. Ultimately she had a Pap smear showing atypical glandular cells and a cervical biopsy on 09/01/2016 revealing invasive adenocarcinoma of the cervix. The patient denies any weight loss or other constitutional symptoms.  CT scan revealed pelvic and super clavicular adenopathy. Patient received radiation therapy with concurrent cisplatin completed in 01/26/2017.  Review of Systems:10 point review of systems is negative except as noted in interval history.   Vitals: Blood pressure 112/71, pulse 80, temperature 97.7 F (36.5 C), temperature source Oral, resp. rate 20, height 5\' 2"  (1.575 m), weight 150 lb 11.2 oz (68.4 kg), SpO2 98 %.  Physical Exam: General : The patient is a healthy woman in no acute distress.  HEENT: normocephalic, extraoccular  movements normal; neck is supple without thyromegally . There are radiation skin changes on the left supraclavicular region Lynphnodes: I am unable to palpate the super clavicular lymph node. Abdomen: Soft, non-tender, no ascites, no organomegally, no masses, no hernias  Pelvic:  EGBUS: Normal female  Vagina: Normal, no lesions  Urethra and Bladder: Normal, non-tender  Cervix flush with the vaginal vault. No lesions are noted. Uterus: Difficult to fully outline because of pain on palpation Bi-manual examination: Non-tender; no adenxal masses or nodularity,   Rectal: normal sphincter tone, no masses, no blood  Lower extremities: No edema or varicosities. Normal range of motion      Allergies  Allergen Reactions  . Amoxicillin Hives  . Penicillins Other (See Comments)    UNKNOWN CHILDHOOD REACTION    Past Medical History:  Diagnosis Date  . Anxiety   . Cervical cancer [facility]) oncologist-  dr gorsuch/  dr Sondra Come   dx 09-01-2016-- Stage IB2--  Invasive cervix adenocarcinoma-- completed chemotherapy (11-09-2016 to 12-08-2016) concurrent w/ external beam radiation (completed 12-22-2016))  . Depression   . History of cancer chemotherapy 11-09-2016 to 12-08-2016  . History of radiation therapy 11-09-2016  to 12-22-2016   cervix was treated to 45 Gy, pelvis was treated to 9 Gy, Sclav left was treated to 56 Gy.  Marland Kitchen History of radiation therapy 01/04/17, 01/10/17, 01/17/17, 01/24/17, 01/26/17   cervix was treated to 27.5 Gy in 5 fractions  . Leukopenia due to antineoplastic chemotherapy [facility])     Past Surgical History:  Procedure Laterality Date  . CYST REMOVAL HAND  age 22  . IR FLUORO GUIDE PORT INSERTION RIGHT  10/29/2016  .  IR REMOVAL TUN ACCESS W/ PORT W/O FL MOD SED  03/15/2017  . IR US GUIDE VASC ACCESS RIGHT  10/29/2016  . TANDEM RING INSERTION N/A 01/04/2017   Procedure: TANDEM RING INSERTION;  Surgeon: Gery Pray, MD;  Location: [facility];  Service: Urology;   Laterality: N/A;  . TANDEM RING INSERTION N/A 01/10/2017   Procedure: TANDEM RING INSERTION;  Surgeon: Gery Pray, MD;  Location: [facility];  Service: Urology;  Laterality: N/A;  . TANDEM RING INSERTION N/A 01/17/2017   Procedure: TANDEM RING INSERTION;  Surgeon: Gery Pray, MD;  Location: [facility];  Service: Urology;  Laterality: N/A;  . TANDEM RING INSERTION N/A 01/24/2017   Procedure: TANDEM RING INSERTION;  Surgeon: Gery Pray, MD;  Location: [facility];  Service: Urology;  Laterality: N/A;  . TANDEM RING INSERTION N/A 01/26/2017   Procedure: TANDEM RING INSERTION;  Surgeon: Gery Pray, MD;  Location: [facility];  Service: Urology;  Laterality: N/A;  . WISDOM TOOTH EXTRACTION     times 4    Current Outpatient Prescriptions  Medication Sig Dispense Refill  . busPIRone (BUSPAR) 7.5 MG tablet Take 1 tablet (7.5 mg total) by mouth daily as needed. 30 tablet 0  . escitalopram (LEXAPRO) 20 MG tablet Take 20 mg by mouth daily.     No current facility-administered medications for this visit.     Social History   Social History  . Marital status: Single    Spouse name: N/A  . Number of children: 0  . Years of education: N/A   Occupational History  . Not on file.   Social History Main Topics  . Smoking status: Current Every Day Smoker    Packs/day: 0.50    Years: 12.00    Types: Cigarettes  . Smokeless tobacco: Never Used     Comment: 10 cig per day  . Alcohol use No  . Drug use: No  . Sexual activity: Yes    Birth control/ protection: None   Other Topics Concern  . Not on file   Social History Narrative  . No narrative on file    Family History  Problem Relation Age of Onset  . Diabetes Maternal Grandmother   . Breast cancer Paternal Grandmother       [name], MD 04/29/2017, 3:00 PM

## 2017-04-29 NOTE — Patient Instructions (Signed)
We will call you with the results of your pap smear from today.  Plan on following up in May 2019 or sooner if needed.  Please call our office after you see Dr. Sondra Come or have his office call when you are here to arrange for an appointment in May.  Please call for any concerns, questions, or new symptoms.

## 2017-05-05 LAB — CYTOLOGY - PAP
Diagnosis: UNDETERMINED — AB
HPV: NOT DETECTED

## 2017-05-22 ENCOUNTER — Other Ambulatory Visit: Payer: Self-pay | Admitting: Gynecologic Oncology

## 2017-05-26 ENCOUNTER — Other Ambulatory Visit: Payer: Medicaid Other

## 2017-05-26 ENCOUNTER — Ambulatory Visit: Payer: Medicaid Other | Admitting: Hematology and Oncology

## 2017-05-29 ENCOUNTER — Other Ambulatory Visit: Payer: Self-pay | Admitting: Gynecologic Oncology

## 2017-07-17 IMAGING — CT CT ABD-PELV W/ CM
2 of 4 series · 16 of 46 positions shown, 18 images · IV contrast (APPLIED)
Comparison: Noncontrast CT on 07/29/2016

CLINICAL DATA: Newly diagnosed cervical carcinoma. Lower abdominal
pain and nausea.

EXAM:
CT ABDOMEN AND PELVIS WITH CONTRAST
TECHNIQUE: Multidetector CT imaging of the abdomen and pelvis was performed
using the standard protocol following bolus administration of
intravenous contrast.
CONTRAST:  100mL 5AYURB-LGG IOPAMIDOL (5AYURB-LGG) INJECTION 61%

[Series 2: axial st · axial · 0.67mm/px · z∈[-468,-78]mm · 13 of 88 slices shown, 15 images]
[im 5/88  soft-tissue]
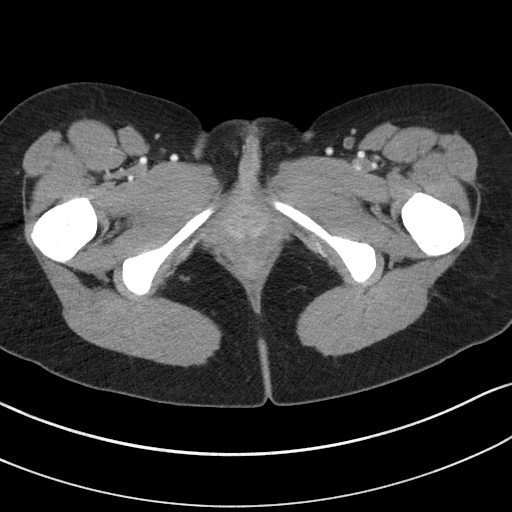
[im 5/88  bone]
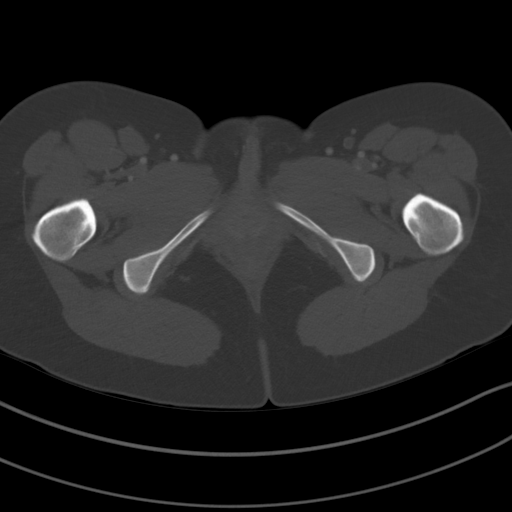
[im 14/88  soft-tissue]
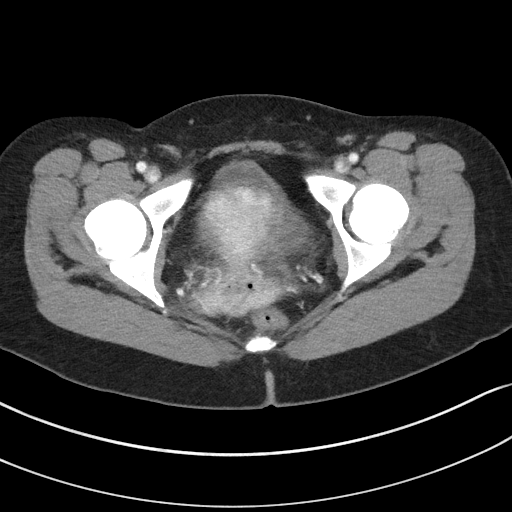
[im 19/88  soft-tissue]
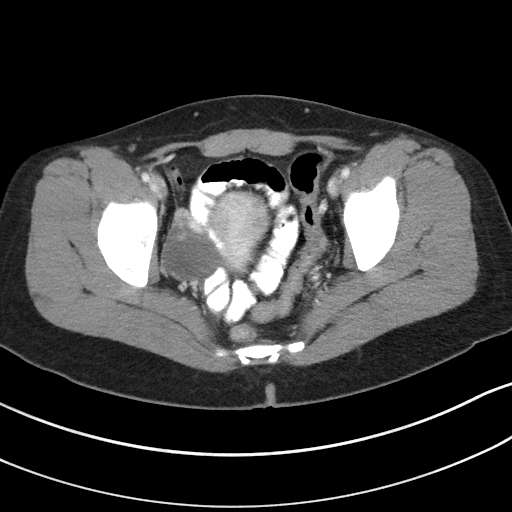
[im 23/88  soft-tissue]
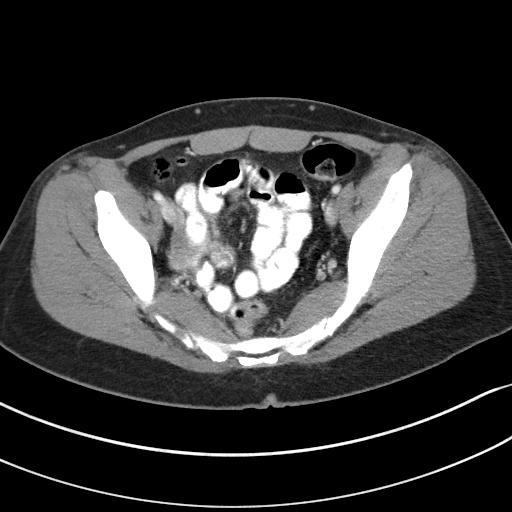
[im 33/88  soft-tissue]
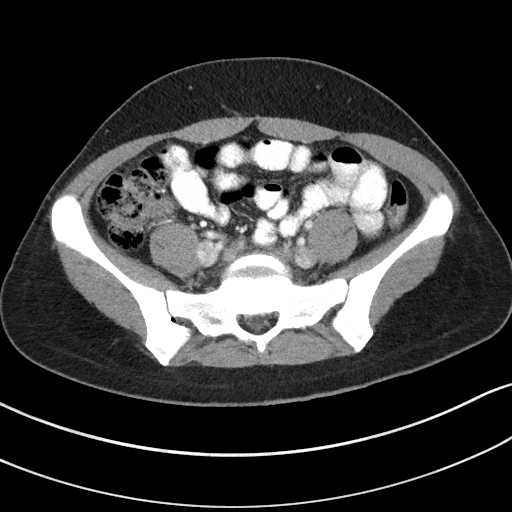
[im 37/88  soft-tissue]
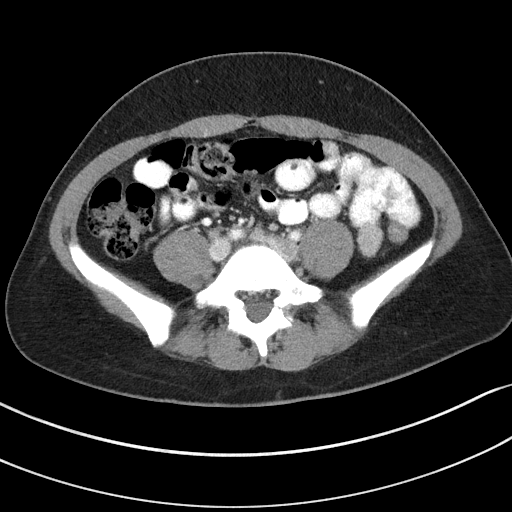
[im 46/88  soft-tissue]
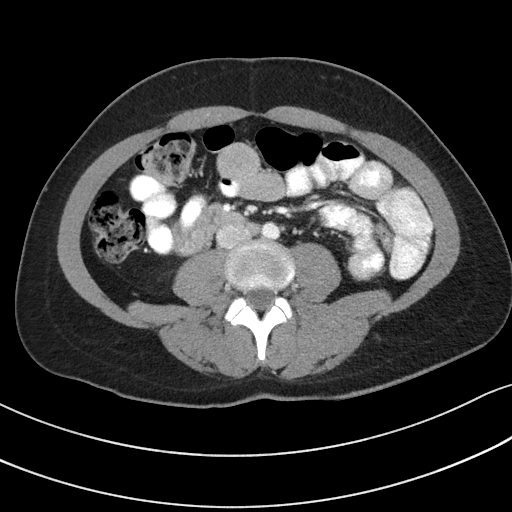
[im 51/88  soft-tissue]
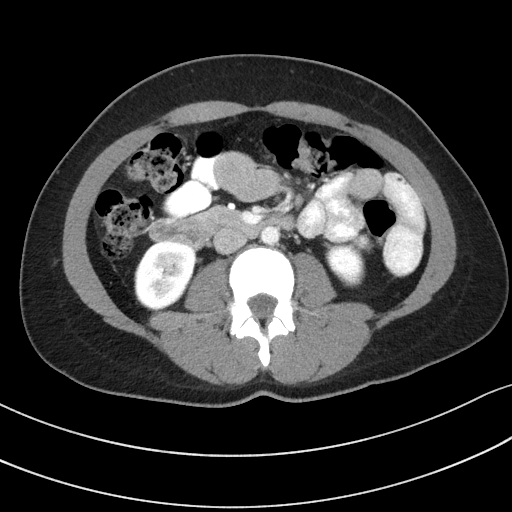
[im 55/88  soft-tissue]
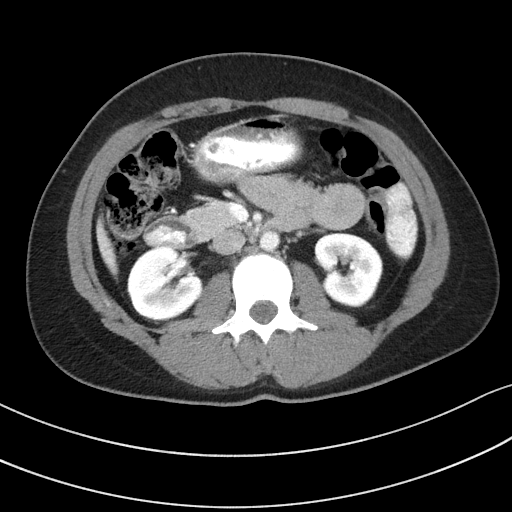
[im 55/88  bone]
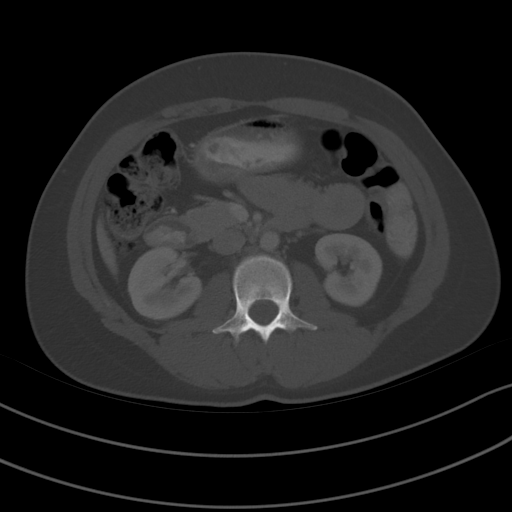
[im 65/88  soft-tissue]
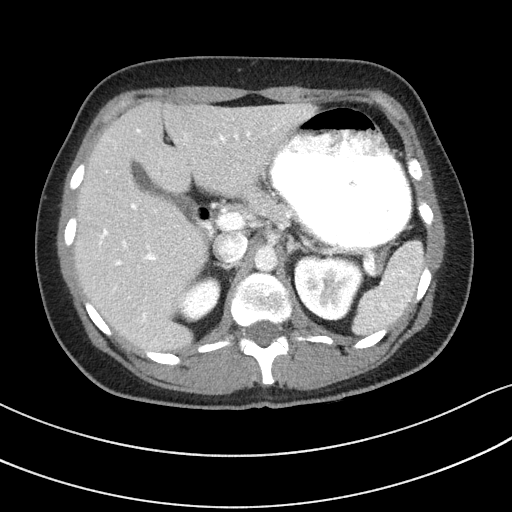
[im 69/88  soft-tissue]
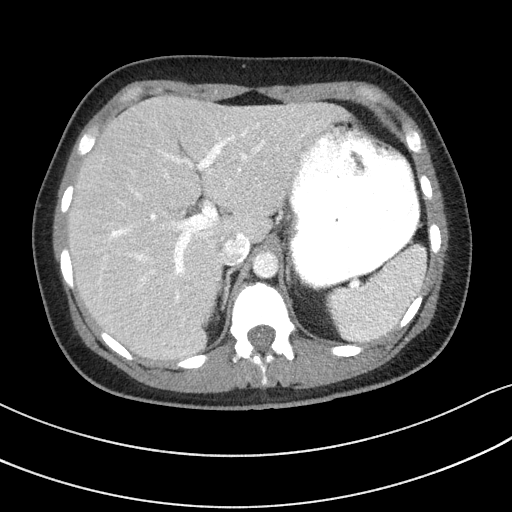
[im 74/88  soft-tissue]
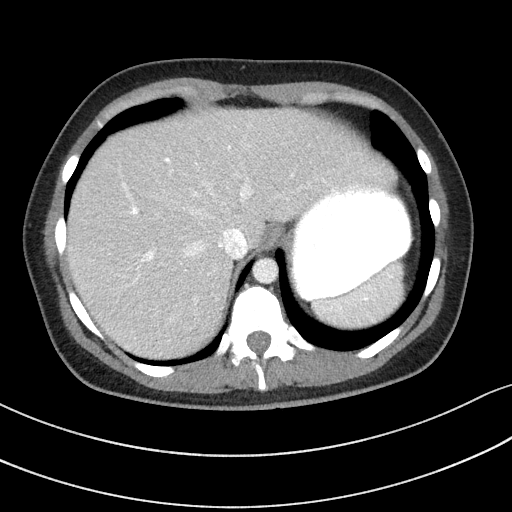
[im 83/88  soft-tissue]
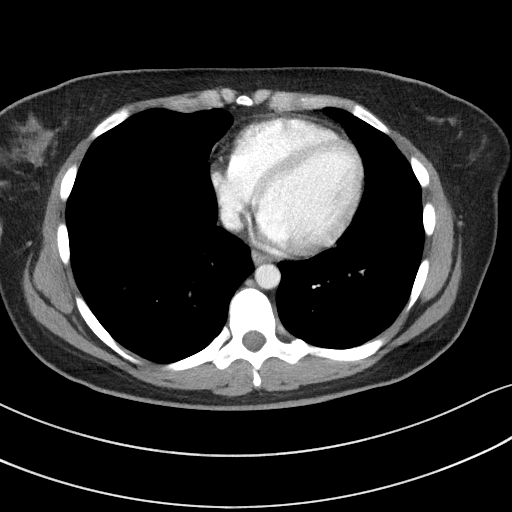

[Series 5: coronal st · coronal · 0.71mm/px · 3 of 92 slices shown]
[im 31/92  soft-tissue]
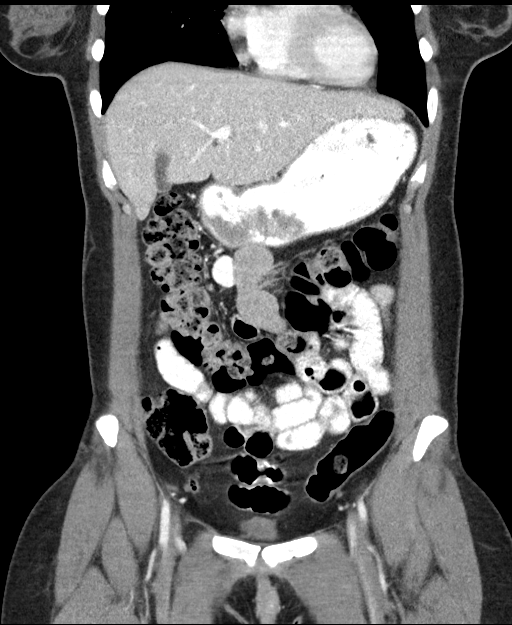
[im 41/92  soft-tissue]
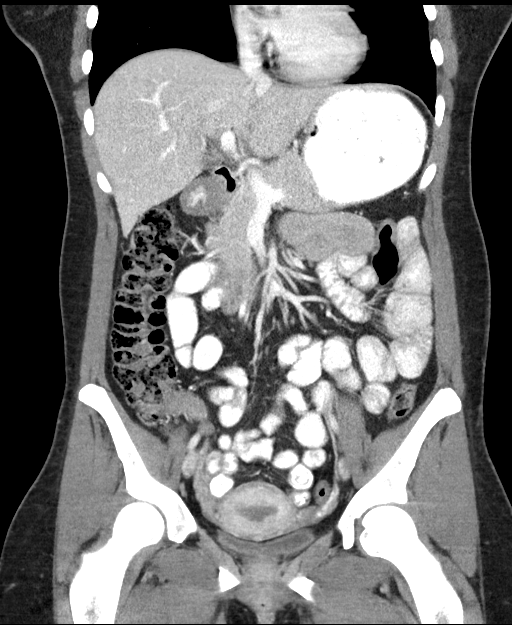
[im 51/92  soft-tissue]
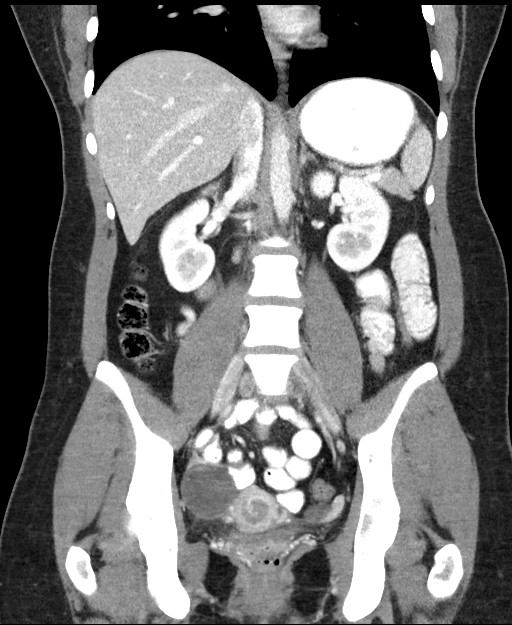

[16 of 46 positions shown; findings below may reference images not displayed]

FINDINGS: Lower Chest: No acute findings.

Hepatobiliary:  No masses identified. Gallbladder is unremarkable.

Pancreas:  No mass or inflammatory changes.

Spleen: Within normal limits in size and appearance.

Adrenals/Urinary Tract: No masses identified. No evidence of
hydronephrosis.

Stomach/Bowel: No evidence of obstruction, inflammatory process or
abnormal fluid collections.

Vascular/Lymphatic: Mild retroperitoneal lymphadenopathy is seen in
the inferior aorto-caval space and the right common iliac chain,
with largest lymph node measuring 10 mm on image 55/2. No upper
abdominal lymphadenopathy identified. No abdominal aortic aneurysm.

Reproductive: A heterogeneously enhancing mass is seen in the cervix
and lower uterine segment which measures 4.3 by 4.7 x 2.6 cm on
image 73/ 2. This is consistent with known cervical carcinoma. A
cm simple right ovarian follicular cyst is seen which is new since
recent exam. No evidence of ascites.

Other:  None.

Musculoskeletal:  No suspicious bone lesions identified.
IMPRESSION: 4.7 cm heterogeneous enhancing mass in the cervix and lower uterine
segment, consistent with known cervical carcinoma.

Mild retroperitoneal lymphadenopathy in the inferior aortocaval
space and right common iliac chain, suspicious for metastatic
disease.

## 2017-08-15 ENCOUNTER — Ambulatory Visit
Admission: RE | Admit: 2017-08-15 | Discharge: 2017-08-15 | Disposition: A | Discharge status: Home / Self Care | Payer: Medicaid Other | Source: Ambulatory Visit | Attending: Radiation Oncology | Admitting: Radiation Oncology

## 2017-08-15 ENCOUNTER — Telehealth: Payer: Self-pay | Admitting: Oncology

## 2017-08-15 NOTE — Telephone Encounter (Signed)
Left a message for patient regarding her follow up appointment today with Dr. Sondra Come.  Requested a return call.

## 2017-08-25 ENCOUNTER — Other Ambulatory Visit: Payer: Self-pay

## 2017-08-25 ENCOUNTER — Ambulatory Visit
Admission: RE | Admit: 2017-08-25 | Discharge: 2017-08-25 | Disposition: A | Discharge status: Home / Self Care | Payer: Medicaid Other | Source: Ambulatory Visit | Attending: Radiation Oncology | Admitting: Radiation Oncology

## 2017-08-25 ENCOUNTER — Encounter: Payer: Self-pay | Admitting: Radiation Oncology

## 2017-08-25 VITALS — BP 109/72 | HR 78 | Temp 98.1°F | Resp 18 | Wt 155.8 lb

## 2017-08-25 DIAGNOSIS — Z9221 Personal history of antineoplastic chemotherapy: Secondary | ICD-10-CM | POA: Diagnosis not present

## 2017-08-25 DIAGNOSIS — Z923 Personal history of irradiation: Secondary | ICD-10-CM | POA: Insufficient documentation

## 2017-08-25 DIAGNOSIS — R599 Enlarged lymph nodes, unspecified: Secondary | ICD-10-CM | POA: Diagnosis not present

## 2017-08-25 DIAGNOSIS — C530 Malignant neoplasm of endocervix: Secondary | ICD-10-CM | POA: Diagnosis present

## 2017-08-25 DIAGNOSIS — Z79899 Other long term (current) drug therapy: Secondary | ICD-10-CM | POA: Diagnosis not present

## 2017-08-25 DIAGNOSIS — R350 Frequency of micturition: Secondary | ICD-10-CM | POA: Insufficient documentation

## 2017-08-25 DIAGNOSIS — R5383 Other fatigue: Secondary | ICD-10-CM | POA: Insufficient documentation

## 2017-08-25 MED ORDER — LORAZEPAM 0.5 MG PO TABS: 0.5000 mg | ORAL_TABLET | Freq: Three times a day (TID) | ORAL | 0 refills | Status: DC | PRN

## 2017-08-25 NOTE — Progress Notes (Signed)
Radiation Oncology         [phone]) [phone] ________________________________  Name: Joanne Griffin MRN: [id number]  Date: 08/25/2017  DOB: Jan 17, 1986  Follow-Up Visit Note  CC: Harvest Dark, PA-C  [name],*    ICD-10-CM   1. Malignant neoplasm of endocervix [facility]) C53.0     Diagnosis:   invasive adenocarcinoma of the cervix, clinical stage IB2 adenocarcinoma cervix with radiographically suspicious pelvic lymphadenopathy and left supraclavicular adenopathy  Interval Since Last Radiation:  7 months 11/09/2016 - 12/23/2016 and HDR on 01/04/2017, 01/10/2017, 01/17/2017, 01/24/2017, 01/26/2017. Site/dose: 1) The cervix was treated to 45 Gy in 25 fractions,  2) The pelviswas treated to 9 Gy in 5 fractions as a boost to the suspicious pelvic nodes. (54 Gy) 3) Sclav-LT was initially prescribed to 60 Gy in 30 fractions, but the patient discontinued at fraction 28 due to severe skin reactions. (56 Gy) 4) The cervix was also treated to 27.5 Gy in 5 fractions of 5.5 Gy (brachytherapy)  Narrative:  The patient returns today for routine follow-up.  Patient denies having any pain. Patient denies having any vaginal bleeding and burning with urination. Patient does report having increased urination. Patient states she is fatigued as well. Denies having any issues with appetite. Patient denies having any abdominal pain. She reports that she will experience diarrhea a few times during the week, but notes it has been improving. She is concerned about when she will get a PET scan to check for possible reoccurrence. To left supraclavical area patient still has some hyperpigmentation to that area but does report it is slowly lightening. She has been very busy working a lot.  REVIEW OF SYSTEMS: A 10+ POINT REVIEW OF SYSTEMS WAS OBTAINED including neurology, dermatology, psychiatry, cardiac, respiratory, lymph, extremities, GI, GU, musculoskeletal, constitutional, reproductive, HEENT. All  pertinent positives are noted in the HPI. All others are negative.                               ALLERGIES:  is allergic to amoxicillin and penicillins.  Meds: Current Outpatient Medications  Medication Sig Dispense Refill  . escitalopram (LEXAPRO) 20 MG tablet Take 20 mg by mouth daily.    Marland Kitchen LORazepam (ATIVAN) 0.5 MG tablet Take 1 tablet (0.5 mg total) by mouth every 8 (eight) hours as needed for anxiety (nausea). 60 tablet 0   No current facility-administered medications for this encounter.     Physical Findings: The patient is in no acute distress. Patient is alert and oriented.  weight is 155 lb 12.8 oz (70.7 kg). Her oral temperature is 98.1 F (36.7 C). Her blood pressure is 109/72 and her pulse is 78. Her respiration is 18 and oxygen saturation is 97%.    No palpable supraclavicular or axillary adenopathy. The lungs are clear to auscultation. The heart has a regular rhythm and rate. The abdomen is soft and nontender with normal bowel sounds. No inguinal adenopathy is appreciated.   On pelvic examination the external genitalia are unremarkable. A speculum exam is performed. There are no mucosal lesions noted in the vaginal vault. Cervix is flush with upper vaginal vault. Radiation changes noted at the cervical os. On bimanual and rectovaginal examination no  pelvic masses are appreciated. Cervix is normal in size. Good rectal sphincter tone.    Lab Findings: Lab Results  Component Value Date   WBC 4.6 03/15/2017   HGB 13.1 03/15/2017   HCT 38.7 03/15/2017  MCV 91.1 03/15/2017   PLT 306 03/15/2017    Radiographic Findings: No results found.  Impression: Clinically, there is no evidence of reoccurrence on exam. She missed her scheduled PET scan after completion of chemo and radiation therapy and we will reorder this scan. If PET scan shows residual activity she will likely need full dose chemotherapy.   Plan:  Follow-up GYN Oncology in 3 months with Dr. Fermin Schwab.  Routine follow-up in Radiation Oncology in 6 months. Ativan refilled today.  ____________________________________  Blair Promise, PhD, MD  This document serves as a record of services personally performed by Blair Promise, PhD, MD. It was created on his behalf by Margit Banda, a trained medical scribe. The creation of this record is based on the scribe's personal observations and the provider's statements to them.

## 2017-08-25 NOTE — Progress Notes (Signed)
Joanne Griffin is here today for follow up.  Patient denies having any pain.  Patient denies having any vaginal bleeding, burning with urination.  Patient does report having increased urination.  Patient states she is fatigued as well.  Denies having any issues with appetite.  Patient Denies having any abdominal pain.  Patient is concerned about when she will get a PET scan to check for possible reoccurrence.  To left scuclavical area patient still has some hyperpigmentation to that area but does report it is slowly lightening.  Vitals:   08/25/17 1630  BP: 109/72  Pulse: 78  Resp: 18  Temp: 98.1 F (36.7 C)  TempSrc: Oral  SpO2: 97%  Weight: 155 lb 12.8 oz (70.7 kg)   Wt Readings from Last 3 Encounters:  08/25/17 155 lb 12.8 oz (70.7 kg)  04/29/17 150 lb 11.2 oz (68.4 kg)  03/15/17 149 lb 9.6 oz (67.9 kg)

## 2017-09-08 ENCOUNTER — Telehealth: Payer: Self-pay | Admitting: *Deleted

## 2017-09-08 NOTE — Telephone Encounter (Signed)
CALLED PATIENT TO INFORM OF PET SCAN FOR 09-16-17 - ARRIVAL TIME - 12:30 PM @ [facility], PT. TO BE NPO- 6 HRS,. PRIOR TO TEST, SPOKE WITH PATIENT AND SHE IS AWARE OF THIS TEST

## 2017-09-16 ENCOUNTER — Encounter (HOSPITAL_COMMUNITY): Admission: RE | Admit: 2017-09-16 | Payer: Medicaid Other | Source: Ambulatory Visit

## 2017-09-19 ENCOUNTER — Telehealth: Payer: Self-pay | Admitting: *Deleted

## 2017-09-19 NOTE — Telephone Encounter (Signed)
CALLED PATIENT TO INFORM OF PET SCAN FOR 09-27-17- ARRIVAL TIME- 12:30 PM @ [facility], PT. TO BE NPO- 6 HRS. PRIOR TO TEST, SPOKE WITH PATIENT AND SHE IS AWARE OF THIS TEST

## 2017-09-27 ENCOUNTER — Encounter (HOSPITAL_COMMUNITY)
Admission: RE | Admit: 2017-09-27 | Discharge: 2017-09-27 | Disposition: A | Discharge status: Home / Self Care | Payer: Medicaid Other | Source: Ambulatory Visit | Attending: Radiation Oncology | Admitting: Radiation Oncology

## 2017-09-27 DIAGNOSIS — C530 Malignant neoplasm of endocervix: Secondary | ICD-10-CM | POA: Insufficient documentation

## 2017-09-27 LAB — GLUCOSE, CAPILLARY: Glucose-Capillary: 112 mg/dL — ABNORMAL HIGH (ref 65–99)

## 2017-09-27 MED ORDER — FLUDEOXYGLUCOSE F - 18 (FDG) INJECTION
8.1000 | Freq: Once | INTRAVENOUS | Status: AC | PRN
  Administered 2017-09-27: 8.1 via INTRAVENOUS

## 2017-09-28 ENCOUNTER — Other Ambulatory Visit: Payer: Self-pay | Admitting: Radiation Oncology

## 2017-09-29 ENCOUNTER — Other Ambulatory Visit: Payer: Self-pay | Admitting: Radiation Oncology

## 2017-09-29 ENCOUNTER — Telehealth: Payer: Self-pay | Admitting: Oncology

## 2017-09-29 MED ORDER — LORAZEPAM 0.5 MG PO TABS: 0.5000 mg | ORAL_TABLET | Freq: Three times a day (TID) | ORAL | 0 refills | Status: DC | PRN

## 2017-09-29 NOTE — Telephone Encounter (Signed)
Called in refill to [facility] for Ativan per Dr. Sondra Come. Called patient and let her know that the refill has been called in.

## 2017-10-21 ENCOUNTER — Other Ambulatory Visit: Payer: Self-pay | Admitting: Radiation Oncology

## 2017-10-29 ENCOUNTER — Other Ambulatory Visit: Payer: Self-pay | Admitting: Radiation Oncology

## 2017-12-15 ENCOUNTER — Encounter: Payer: Self-pay | Admitting: Radiation Oncology

## 2017-12-19 ENCOUNTER — Encounter: Payer: Self-pay | Admitting: Hematology and Oncology

## 2017-12-20 ENCOUNTER — Telehealth: Payer: Self-pay

## 2017-12-20 NOTE — Telephone Encounter (Signed)
Called back per Dr. Alvy Bimler and offered appt for today. She is out of town and would prefer appt next week. Scheduling message sent.

## 2017-12-20 NOTE — Telephone Encounter (Signed)
Called and left a message for her to call the office regarding mychart message yesterday.

## 2017-12-20 NOTE — Telephone Encounter (Signed)
She called and left a message to call her back.  Called her back.   She does not have a primary MD. The area behind her right ear is hard and about the size of a ping pong ball. She also has a area on the left side of her neck that feels the same way as when they found that area in her lymph node. She noticed all of this all yesterday.

## 2017-12-21 ENCOUNTER — Telehealth: Payer: Self-pay | Admitting: Hematology and Oncology

## 2017-12-21 NOTE — Telephone Encounter (Signed)
Left message for patient regarding upcoming July appointments per 6/25 sch message

## 2017-12-26 ENCOUNTER — Telehealth: Payer: Self-pay | Admitting: Hematology and Oncology

## 2017-12-26 ENCOUNTER — Telehealth: Payer: Self-pay

## 2017-12-26 NOTE — Telephone Encounter (Signed)
She called and left a message to call her. She has more lymph nodes swollen. One on the side of her neck popped up yesterday. The one behind her ear is hurting and the right side of her neck is swollen. Should she go to ER?  Called back. Above message given to Dr. Alvy Bimler. Offered appt for tomorrow or 7/3. She would like appt. Scheduling message sent.

## 2017-12-26 NOTE — Telephone Encounter (Signed)
Called patient regarding 7/2

## 2017-12-27 ENCOUNTER — Encounter: Payer: Self-pay | Admitting: Hematology and Oncology

## 2017-12-27 ENCOUNTER — Inpatient Hospital Stay: Payer: Self-pay

## 2017-12-27 ENCOUNTER — Telehealth: Payer: Self-pay | Admitting: Hematology and Oncology

## 2017-12-27 ENCOUNTER — Inpatient Hospital Stay: Payer: Self-pay | Attending: Hematology and Oncology | Admitting: Hematology and Oncology

## 2017-12-27 VITALS — BP 123/75 | HR 83 | Temp 97.4°F | Resp 18 | Ht 62.0 in | Wt 141.8 lb

## 2017-12-27 DIAGNOSIS — Z720 Tobacco use: Secondary | ICD-10-CM

## 2017-12-27 DIAGNOSIS — C530 Malignant neoplasm of endocervix: Secondary | ICD-10-CM

## 2017-12-27 DIAGNOSIS — Z79899 Other long term (current) drug therapy: Secondary | ICD-10-CM | POA: Insufficient documentation

## 2017-12-27 DIAGNOSIS — C539 Malignant neoplasm of cervix uteri, unspecified: Secondary | ICD-10-CM | POA: Insufficient documentation

## 2017-12-27 DIAGNOSIS — F17210 Nicotine dependence, cigarettes, uncomplicated: Secondary | ICD-10-CM | POA: Insufficient documentation

## 2017-12-27 DIAGNOSIS — R229 Localized swelling, mass and lump, unspecified: Secondary | ICD-10-CM

## 2017-12-27 LAB — CBC WITH DIFFERENTIAL/PLATELET
Basophils Absolute: 0 10*3/uL (ref 0.0–0.1)
Basophils Relative: 0 %
Eosinophils Absolute: 0.1 10*3/uL (ref 0.0–0.5)
Eosinophils Relative: 2 %
HEMATOCRIT: 38 % (ref 34.8–46.6)
HEMOGLOBIN: 12.6 g/dL (ref 11.6–15.9)
LYMPHS ABS: 0.9 10*3/uL (ref 0.9–3.3)
LYMPHS PCT: 19 %
MCH: 29.2 pg (ref 25.1–34.0)
MCHC: 33.2 g/dL (ref 31.5–36.0)
MCV: 88.2 fL (ref 79.5–101.0)
MONOS PCT: 7 %
Monocytes Absolute: 0.3 10*3/uL (ref 0.1–0.9)
NEUTROS ABS: 3.2 10*3/uL (ref 1.5–6.5)
NEUTROS PCT: 72 %
Platelets: 289 10*3/uL (ref 145–400)
RBC: 4.31 MIL/uL (ref 3.70–5.45)
RDW: 13.3 % (ref 11.2–14.5)
WBC: 4.5 10*3/uL (ref 3.9–10.3)

## 2017-12-27 LAB — COMPREHENSIVE METABOLIC PANEL
ALBUMIN: 3.7 g/dL (ref 3.5–5.0)
ALT: 20 U/L (ref 0–44)
ANION GAP: 8 (ref 5–15)
AST: 17 U/L (ref 15–41)
Alkaline Phosphatase: 78 U/L (ref 38–126)
BUN: 13 mg/dL (ref 6–20)
CHLORIDE: 107 mmol/L (ref 98–111)
CO2: 26 mmol/L (ref 22–32)
Calcium: 9.5 mg/dL (ref 8.9–10.3)
Creatinine, Ser: 0.85 mg/dL (ref 0.44–1.00)
GFR calc non Af Amer: >60 mL/min (ref >60)
GLUCOSE: 113 mg/dL — AB (ref 70–99)
POTASSIUM: 3.4 mmol/L — AB (ref 3.5–5.1)
SODIUM: 141 mmol/L (ref 135–145)
Total Bilirubin: 0.2 mg/dL — ABNORMAL LOW (ref 0.3–1.2)
Total Protein: 7 g/dL (ref 6.5–8.1)

## 2017-12-27 LAB — MAGNESIUM: Magnesium: 2.1 mg/dL (ref 1.7–2.4)

## 2017-12-27 NOTE — Progress Notes (Signed)
[facility] OFFICE PROGRESS NOTE  Patient Care Team: Delbert Harness as PCP - General (Family Medicine)  ASSESSMENT & PLAN:  Cervical cancer [facility]) She has new concern with new palpable lymphadenopathy in the occipital region, worrisome for possible cancer recurrence I recommend repeat imaging study for further evaluation and she agreed to proceed  Skin nodule She has multiple skin nodules palpable in the occipital region These lesions were not present on recent PET CT scan I recommend repeat imaging study for evaluation before attempting biopsy and she agreed  Tobacco abuse She had recent recurrence of smoking I recommend her to quit smoking immediately   Orders Placed This Encounter  Procedures  . NM PET Image Restag (PS) Skull Base To Thigh    Standing Status:   Future    Standing Expiration Date:   12/28/2018    Order Specific Question:   If indicated for the ordered procedure, I authorize the administration of a radiopharmaceutical per Radiology protocol    Answer:   Yes    Order Specific Question:   Preferred imaging location?    Answer:   [facility]    Order Specific Question:   Radiology Contrast Protocol - do NOT remove file path    Answer:   [email or web address]    Order Specific Question:   Is the patient pregnant?    Answer:   No  . Comprehensive metabolic panel    Standing Status:   Future    Number of Occurrences:   1    Standing Expiration Date:   01/31/2019  . CBC with Differential/Platelet    Standing Status:   Future    Number of Occurrences:   1    Standing Expiration Date:   01/31/2019  . Magnesium    Standing Status:   Future    Number of Occurrences:   1    Standing Expiration Date:   12/28/2018    INTERVAL HISTORY: Please see below for problem oriented charting. She returns for urgent evaluation She has palpated new lymphadenopathy in the occipital region on both sides of the neck She denies  recent vaginal bleeding or discharge She has become postmenopausal since completion of radiation treatment She has resumed smoking  SUMMARY OF ONCOLOGIC HISTORY:   Adenocarcinoma (GYN origin) (Resolved)   09/01/2016 Initial Diagnosis    Adenocarcinoma (GYN origin)       Cervical cancer ([place])   08/26/2016 Pathology Results    Adequacy Reason Satisfactory for evaluation, endocervical/transformation zone component PRESENT. Diagnosis ATYPICAL GLANDULAR CELLS. SEE COMMENT. COMMENT: THERE ARE GROUPS OF ATYPICAL GLANDULAR CELLS WHICH APPEAR TO BE OF ENDOCERVICAL ORIGIN. TISSUE STUDIES, SUCH AS A CURETTAGE, MAY HELP BETTER EVALUATE THE EXTENT AND SEVERITY OF THESE ATYPICAL CELLS.      09/01/2016 Initial Diagnosis    The patient reports she is not had Pap smears in the "many years". She presented initially with flank pain to the emergency room on 07/29/2016. She was treated for a UTI and had resolution of her flank pain although she continued to have suprapubic pain. She has had irregular bleeding and a copious discharge for several months. 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 Microscopic Comment Immunohistochemistry is attempted to determine endocervical vs. endometrial origin. CEA is focally positive, p16 is diffusely strongly positive,  ER is weakly positive, and vimentin is positive. This profile is not specific as to origin and clinical correlation is recommended. Dr. Avis Epley has reviewed the case. The case was called to Dr. Ilda Basset on 09/06/2016      10/05/2016 Imaging    Ct abdomen: 4.7 cm heterogeneous enhancing mass in the cervix and lower uterine segment, consistent with known cervical carcinoma. Mild retroperitoneal lymphadenopathy in the inferior aortocaval space and right common iliac  chain, suspicious for metastatic disease.      10/29/2016 Procedure    Ultrasound and fluoroscopically guided right internal jugular single lumen power port catheter insertion. Tip in the SVC/RA junction. Catheter ready for use      11/09/2016 - 01/26/2017 Radiation Therapy    Radiation treatment dates:   11/09/2016 - 12/23/2016 and HDR on 01/04/2017, 01/10/2017, 01/17/2017, 01/24/2017, 01/26/2017  Site/dose:    1) The cervix was treated to 45 Gy in 25 fractions,   2) The pelvis was treated to 9 Gy in 5 fractions as a boost to the suspicious pelvic  nodes. (54 Gy) 3) Sclav-LT was initially prescribed to 60 Gy in 30 fractions, but the patient discontinued at fraction 28 due to severe skin reactions. (56 Gy) 4) The cervix was also treated to 27.5 Gy in 5 fractions of 5.5 Gy.  Beams/energy:   1) Cervix: 3D // Photon 2) pelvic Boost: IMRT // Photon 3) Sclav-Lt: 3D // 6X, 10X Photon 4) HDR Ir-192 // Brachytherapy      11/09/2016 - 12/08/2016 Chemotherapy    She receives weekly cisplatin      03/15/2017 Procedure    Removal of implanted Port-A-Cath utilizing sharp and blunt dissection. The procedure was uncomplicated.       REVIEW OF SYSTEMS:   Constitutional: Denies fevers, chills or abnormal weight loss Eyes: Denies blurriness of vision Ears, nose, mouth, throat, and face: Denies mucositis or sore throat Respiratory: Denies cough, dyspnea or wheezes Cardiovascular: Denies palpitation, chest discomfort or lower extremity swelling Gastrointestinal:  Denies nausea, heartburn or change in bowel habits Skin: Denies abnormal skin rashes Neurological:Denies numbness, tingling or new weaknesses Behavioral/Psych: Mood is stable, no new changes  All other systems were reviewed with the patient and are negative.  I have reviewed the past medical history, past surgical history, social history and family history with the patient and they are unchanged from previous note.  ALLERGIES:  is  allergic to amoxicillin and penicillins.  MEDICATIONS:  Current Outpatient Medications  Medication Sig Dispense Refill  . escitalopram (LEXAPRO) 20 MG tablet Take 20 mg by mouth daily.    Marland Kitchen LORazepam (ATIVAN) 0.5 MG tablet Take 1 tablet (0.5 mg total) by mouth every 8 (eight) hours as needed for anxiety (nausea). 60 tablet 0   No current facility-administered medications for this visit.     PHYSICAL EXAMINATION: ECOG PERFORMANCE STATUS: 1 - Symptomatic but completely ambulatory  Vitals:   12/27/17 0924  BP: 123/75  Pulse: 83  Resp: 18  Temp: (!) 97.4 F (36.3 C)  SpO2: 100%   Filed Weights   12/27/17 0924  Weight: 141 lb 12.8 oz (64.3 kg)    GENERAL:alert, no distress and comfortable SKIN: She has palpable subcutaneous nodules in the occipital region of indeterminate etiology EYES: normal, Conjunctiva are pink and non-injected, sclera clear OROPHARYNX:no exudate, no erythema and lips, buccal mucosa, and tongue normal  NECK: supple, thyroid normal size, non-tender, without nodularity LYMPH:  no palpable lymphadenopathy in the cervical, axillary or inguinal LUNGS: clear to auscultation and  percussion with normal breathing effort HEART: regular rate & rhythm and no murmurs and no lower extremity edema ABDOMEN:abdomen soft, non-tender and normal bowel sounds Musculoskeletal:no cyanosis of digits and no clubbing  NEURO: alert & oriented x 3 with fluent speech, no focal motor/sensory deficits  LABORATORY DATA:  I have reviewed the data as listed    Component Value Date/Time   NA 141 12/27/2017 0950   NA 137 02/17/2017 1240   K 3.4 (L) 12/27/2017 0950   K 3.6 02/17/2017 1240   CL 107 12/27/2017 0950   CO2 26 12/27/2017 0950   CO2 24 02/17/2017 1240   GLUCOSE 113 (H) 12/27/2017 0950   GLUCOSE 121 02/17/2017 1240   BUN 13 12/27/2017 0950   BUN 7.5 02/17/2017 1240   CREATININE 0.85 12/27/2017 0950   CREATININE 0.9 02/17/2017 1240   CALCIUM 9.5 12/27/2017 0950   CALCIUM  9.6 02/17/2017 1240   PROT 7.0 12/27/2017 0950   PROT 7.0 02/17/2017 1240   ALBUMIN 3.7 12/27/2017 0950   ALBUMIN 3.6 02/17/2017 1240   AST 17 12/27/2017 0950   AST 22 02/17/2017 1240   ALT 20 12/27/2017 0950   ALT 52 02/17/2017 1240   ALKPHOS 78 12/27/2017 0950   ALKPHOS 65 02/17/2017 1240   BILITOT 0.2 (L) 12/27/2017 0950   BILITOT 0.36 02/17/2017 1240   GFRNONAA >60 12/27/2017 0950   GFRAA >60 12/27/2017 0950    No results found for: SPEP, UPEP  Lab Results  Component Value Date   WBC 4.5 12/27/2017   NEUTROABS 3.2 12/27/2017   HGB 12.6 12/27/2017   HCT 38.0 12/27/2017   MCV 88.2 12/27/2017   PLT 289 12/27/2017      Chemistry      Component Value Date/Time   NA 141 12/27/2017 0950   NA 137 02/17/2017 1240   K 3.4 (L) 12/27/2017 0950   K 3.6 02/17/2017 1240   CL 107 12/27/2017 0950   CO2 26 12/27/2017 0950   CO2 24 02/17/2017 1240   BUN 13 12/27/2017 0950   BUN 7.5 02/17/2017 1240   CREATININE 0.85 12/27/2017 0950   CREATININE 0.9 02/17/2017 1240      Component Value Date/Time   CALCIUM 9.5 12/27/2017 0950   CALCIUM 9.6 02/17/2017 1240   ALKPHOS 78 12/27/2017 0950   ALKPHOS 65 02/17/2017 1240   AST 17 12/27/2017 0950   AST 22 02/17/2017 1240   ALT 20 12/27/2017 0950   ALT 52 02/17/2017 1240   BILITOT 0.2 (L) 12/27/2017 0950   BILITOT 0.36 02/17/2017 1240       RADIOGRAPHIC STUDIES: I have review her most recent PET CT scan which show no evidence of disease in the occipital region I have personally reviewed the radiological images as listed and agreed with the findings in the report.   All questions were answered. The patient knows to call the clinic with any problems, questions or concerns. No barriers to learning was detected.  I spent 15 minutes counseling the patient face to face. The total time spent in the appointment was 20 minutes and more than 50% was on counseling and review of test results  Heath Lark, MD 12/27/2017 4:22 PM

## 2017-12-27 NOTE — Assessment & Plan Note (Signed)
She has multiple skin nodules palpable in the occipital region These lesions were not present on recent PET CT scan I recommend repeat imaging study for evaluation before attempting biopsy and she agreed

## 2017-12-27 NOTE — Assessment & Plan Note (Signed)
She had recent recurrence of smoking I recommend her to quit smoking immediately

## 2017-12-27 NOTE — Telephone Encounter (Signed)
Gave patient avs and calendar of upcoming July appointments.  °

## 2017-12-27 NOTE — Assessment & Plan Note (Signed)
She has new concern with new palpable lymphadenopathy in the occipital region, worrisome for possible cancer recurrence I recommend repeat imaging study for further evaluation and she agreed to proceed

## 2018-01-02 ENCOUNTER — Ambulatory Visit: Payer: Self-pay | Admitting: Hematology and Oncology

## 2018-01-04 ENCOUNTER — Ambulatory Visit (HOSPITAL_COMMUNITY)
Admission: RE | Admit: 2018-01-04 | Discharge: 2018-01-04 | Disposition: A | Discharge status: Home / Self Care | Payer: Self-pay | Source: Ambulatory Visit | Attending: Hematology and Oncology | Admitting: Hematology and Oncology

## 2018-01-04 ENCOUNTER — Encounter (HOSPITAL_COMMUNITY): Payer: Self-pay

## 2018-01-04 DIAGNOSIS — R590 Localized enlarged lymph nodes: Secondary | ICD-10-CM | POA: Insufficient documentation

## 2018-01-04 DIAGNOSIS — C530 Malignant neoplasm of endocervix: Secondary | ICD-10-CM | POA: Insufficient documentation

## 2018-01-04 DIAGNOSIS — R229 Localized swelling, mass and lump, unspecified: Secondary | ICD-10-CM | POA: Insufficient documentation

## 2018-01-04 DIAGNOSIS — R911 Solitary pulmonary nodule: Secondary | ICD-10-CM | POA: Insufficient documentation

## 2018-01-04 LAB — GLUCOSE, CAPILLARY: GLUCOSE-CAPILLARY: 99 mg/dL (ref 70–99)

## 2018-01-04 MED ORDER — FLUDEOXYGLUCOSE F - 18 (FDG) INJECTION
7.0600 | Freq: Once | INTRAVENOUS | Status: AC
  Administered 2018-01-04: 7.06 via INTRAVENOUS

## 2018-01-05 ENCOUNTER — Telehealth: Payer: Self-pay | Admitting: Hematology and Oncology

## 2018-01-05 ENCOUNTER — Inpatient Hospital Stay (HOSPITAL_BASED_OUTPATIENT_CLINIC_OR_DEPARTMENT_OTHER): Payer: Self-pay | Admitting: Hematology and Oncology

## 2018-01-05 ENCOUNTER — Encounter: Payer: Self-pay | Admitting: Hematology and Oncology

## 2018-01-05 VITALS — BP 126/92 | HR 100 | Temp 97.8°F | Resp 18 | Ht 62.0 in | Wt 140.7 lb

## 2018-01-05 DIAGNOSIS — R911 Solitary pulmonary nodule: Secondary | ICD-10-CM | POA: Insufficient documentation

## 2018-01-05 DIAGNOSIS — F17210 Nicotine dependence, cigarettes, uncomplicated: Secondary | ICD-10-CM

## 2018-01-05 DIAGNOSIS — Z79899 Other long term (current) drug therapy: Secondary | ICD-10-CM

## 2018-01-05 DIAGNOSIS — C539 Malignant neoplasm of cervix uteri, unspecified: Secondary | ICD-10-CM

## 2018-01-05 DIAGNOSIS — Z720 Tobacco use: Secondary | ICD-10-CM

## 2018-01-05 DIAGNOSIS — R229 Localized swelling, mass and lump, unspecified: Secondary | ICD-10-CM

## 2018-01-05 DIAGNOSIS — C530 Malignant neoplasm of endocervix: Secondary | ICD-10-CM

## 2018-01-05 MED ORDER — LEVOFLOXACIN 500 MG PO TABS: 500.0000 mg | ORAL_TABLET | Freq: Every day | ORAL | 0 refills | Status: DC

## 2018-01-05 MED ORDER — NICOTINE 21-14-7 MG/24HR TD KIT: PACK | TRANSDERMAL | 0 refills | Status: DC

## 2018-01-05 NOTE — Telephone Encounter (Signed)
Gave patient avs and calendar of upcoming aug appts.  °

## 2018-01-05 NOTE — Assessment & Plan Note (Signed)
I have reviewed multiple imaging studies with the patient and family PET CT scan showed new lesion in the mediastinum lymph node along with a new spot in the right upper lung Infectious versus malignancy cannot be distinguished The patient had recent coughing I recommend antibiotic therapy for 1 week and repeat CT scan of the chest in 1 month If repeat imaging studies show progression, she would need to be resume on palliative chemotherapy The patient is advised to quit smoking

## 2018-01-05 NOTE — Assessment & Plan Note (Signed)
The cause of the lesion is unknown For now, I recommend antibiotic therapy and repeat CT in 1 month She agree with the plan of care

## 2018-01-05 NOTE — Assessment & Plan Note (Signed)
She is smoking a pack of cigarettes per day but is interested to quit smoking I recommend nicotine replacement therapy with nicotine patch We set quit date in 1 month

## 2018-01-05 NOTE — Progress Notes (Signed)
[place] OFFICE PROGRESS NOTE  Patient Care Team: Delbert Harness as PCP - General (Family Medicine)  ASSESSMENT & PLAN:  Cervical cancer [facility]) I have reviewed multiple imaging studies with the patient and family PET CT scan showed new lesion in the mediastinum lymph node along with a new spot in the right upper lung Infectious versus malignancy cannot be distinguished The patient had recent coughing I recommend antibiotic therapy for 1 week and repeat CT scan of the chest in 1 month If repeat imaging studies show progression, she would need to be resume on palliative chemotherapy The patient is advised to quit smoking  Lesion of right lung The cause of the lesion is unknown For now, I recommend antibiotic therapy and repeat CT in 1 month She agree with the plan of care  Tobacco abuse She is smoking a pack of cigarettes per day but is interested to quit smoking I recommend nicotine replacement therapy with nicotine patch We set quit date in 1 month  Skin nodule The skin nodule on her scalp is not hypermetabolic Recommend observation only for now   Orders Placed This Encounter  Procedures  . CT CHEST W CONTRAST    Standing Status:   Future    Standing Expiration Date:   01/06/2019    Order Specific Question:   If indicated for the ordered procedure, I authorize the administration of contrast media per Radiology protocol    Answer:   Yes    Order Specific Question:   Preferred imaging location?    Answer:   [facility]    Order Specific Question:   Radiology Contrast Protocol - do NOT remove file path    Answer:   _[email or web address]    Order Specific Question:   Is patient pregnant?    Answer:   No    INTERVAL HISTORY: Please see below for problem oriented charting. She returns for further follow-up She has some mild cough She denies progression of subcutaneous nodules Her mother and grandmother were  present She is smoking a pack of cigarettes per day She denies abdominal pain no recent vaginal bleeding  SUMMARY OF ONCOLOGIC HISTORY:   Adenocarcinoma (GYN origin) (Resolved)   09/01/2016 Initial Diagnosis    Adenocarcinoma (GYN origin)       Cervical cancer ([place])   08/26/2016 Pathology Results    Adequacy Reason Satisfactory for evaluation, endocervical/transformation zone component PRESENT. Diagnosis ATYPICAL GLANDULAR CELLS. SEE COMMENT. COMMENT: THERE ARE GROUPS OF ATYPICAL GLANDULAR CELLS WHICH APPEAR TO BE OF ENDOCERVICAL ORIGIN. TISSUE STUDIES, SUCH AS A CURETTAGE, MAY HELP BETTER EVALUATE THE EXTENT AND SEVERITY OF THESE ATYPICAL CELLS.      09/01/2016 Initial Diagnosis    The patient reports she is not had Pap smears in the "many years". She presented initially with flank pain to the emergency room on 07/29/2016. She was treated for a UTI and had resolution of her flank pain although she continued to have suprapubic pain. She has had irregular bleeding and a copious discharge for several months. 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 Microscopic Comment Immunohistochemistry is attempted to determine endocervical vs. endometrial origin. CEA is focally positive, p16 is diffusely strongly positive, ER is weakly positive, and vimentin is positive. This profile is not specific as to origin and  clinical correlation is recommended. Dr. Avis Epley has reviewed the case. The case was called to Dr. Ilda Basset on 09/06/2016      10/05/2016 Imaging    Ct abdomen: 4.7 cm heterogeneous enhancing mass in the cervix and lower uterine segment, consistent with known cervical carcinoma. Mild retroperitoneal lymphadenopathy in the inferior aortocaval space and right common iliac chain, suspicious for  metastatic disease.      10/29/2016 Procedure    Ultrasound and fluoroscopically guided right internal jugular single lumen power port catheter insertion. Tip in the SVC/RA junction. Catheter ready for use      11/09/2016 - 01/26/2017 Radiation Therapy    Radiation treatment dates:   11/09/2016 - 12/23/2016 and HDR on 01/04/2017, 01/10/2017, 01/17/2017, 01/24/2017, 01/26/2017  Site/dose:    1) The cervix was treated to 45 Gy in 25 fractions,   2) The pelvis was treated to 9 Gy in 5 fractions as a boost to the suspicious pelvic  nodes. (54 Gy) 3) Sclav-LT was initially prescribed to 60 Gy in 30 fractions, but the patient discontinued at fraction 28 due to severe skin reactions. (56 Gy) 4) The cervix was also treated to 27.5 Gy in 5 fractions of 5.5 Gy.  Beams/energy:   1) Cervix: 3D // Photon 2) pelvic Boost: IMRT // Photon 3) Sclav-Lt: 3D // 6X, 10X Photon 4) HDR Ir-192 // Brachytherapy      11/09/2016 - 12/08/2016 Chemotherapy    She receives weekly cisplatin      03/15/2017 Procedure    Removal of implanted Port-A-Cath utilizing sharp and blunt dissection. The procedure was uncomplicated.      01/04/2018 PET scan    1. New hypermetabolic RIGHT upper lobe pulmonary nodule coupled with new hypermetabolic RIGHT paratracheal lymph node. Differential includes small pulmonary infection with reactive node versus pulmonary metastasis and mediastinal nodal metastasis. As pulmonary lesion was not present 3 months prior infection is a distinct possibility however the intensity of the paratracheal node favors malignancy. Consider short-term follow-up CT with contrast (1 month). If lesion persists or enlarges malignancy is likely. 2. No evidence of abnormal metabolic the uterine cervix. No pelvic adenopathy. 3. No evidence of malignancy in the occipital region.       REVIEW OF SYSTEMS:   Constitutional: Denies fevers, chills or abnormal weight loss Eyes: Denies blurriness of vision Ears,  nose, mouth, throat, and face: Denies mucositis or sore throat Cardiovascular: Denies palpitation, chest discomfort or lower extremity swelling Gastrointestinal:  Denies nausea, heartburn or change in bowel habits Skin: Denies abnormal skin rashes Lymphatics: Denies new lymphadenopathy or easy bruising Neurological:Denies numbness, tingling or new weaknesses Behavioral/Psych: Mood is stable, no new changes  All other systems were reviewed with the patient and are negative.  I have reviewed the past medical history, past surgical history, social history and family history with the patient and they are unchanged from previous note.  ALLERGIES:  is allergic to amoxicillin and penicillins.  MEDICATIONS:  Current Outpatient Medications  Medication Sig Dispense Refill  . escitalopram (LEXAPRO) 20 MG tablet Take 20 mg by mouth daily.    Marland Kitchen levofloxacin (LEVAQUIN) 500 MG tablet Take 1 tablet (500 mg total) by mouth daily. 7 tablet 0  . LORazepam (ATIVAN) 0.5 MG tablet Take 1 tablet (0.5 mg total) by mouth every 8 (eight) hours as needed for anxiety (nausea). 60 tablet 0  . Nicotine 21-14-7 MG/24HR KIT OK to give equivalent if kit is available 21 mg daily to x 1 week, then 14 mg daily x  1 week, then 7 mg daily for 1 week, then stop 1 each 0   No current facility-administered medications for this visit.     PHYSICAL EXAMINATION: ECOG PERFORMANCE STATUS: 1 - Symptomatic but completely ambulatory  Vitals:   01/05/18 0857  BP: (!) 126/92  Pulse: 100  Resp: 18  Temp: 97.8 F (36.6 C)  SpO2: 100%   Filed Weights   01/05/18 0857  Weight: 140 lb 11.2 oz (63.8 kg)    GENERAL:alert, no distress and comfortable NEURO: alert & oriented x 3 with fluent speech, no focal motor/sensory deficits  LABORATORY DATA:  I have reviewed the data as listed    Component Value Date/Time   NA 141 12/27/2017 0950   NA 137 02/17/2017 1240   K 3.4 (L) 12/27/2017 0950   K 3.6 02/17/2017 1240   CL 107  12/27/2017 0950   CO2 26 12/27/2017 0950   CO2 24 02/17/2017 1240   GLUCOSE 113 (H) 12/27/2017 0950   GLUCOSE 121 02/17/2017 1240   BUN 13 12/27/2017 0950   BUN 7.5 02/17/2017 1240   CREATININE 0.85 12/27/2017 0950   CREATININE 0.9 02/17/2017 1240   CALCIUM 9.5 12/27/2017 0950   CALCIUM 9.6 02/17/2017 1240   PROT 7.0 12/27/2017 0950   PROT 7.0 02/17/2017 1240   ALBUMIN 3.7 12/27/2017 0950   ALBUMIN 3.6 02/17/2017 1240   AST 17 12/27/2017 0950   AST 22 02/17/2017 1240   ALT 20 12/27/2017 0950   ALT 52 02/17/2017 1240   ALKPHOS 78 12/27/2017 0950   ALKPHOS 65 02/17/2017 1240   BILITOT 0.2 (L) 12/27/2017 0950   BILITOT 0.36 02/17/2017 1240   GFRNONAA >60 12/27/2017 0950   GFRAA >60 12/27/2017 0950    No results found for: SPEP, UPEP  Lab Results  Component Value Date   WBC 4.5 12/27/2017   NEUTROABS 3.2 12/27/2017   HGB 12.6 12/27/2017   HCT 38.0 12/27/2017   MCV 88.2 12/27/2017   PLT 289 12/27/2017      Chemistry      Component Value Date/Time   NA 141 12/27/2017 0950   NA 137 02/17/2017 1240   K 3.4 (L) 12/27/2017 0950   K 3.6 02/17/2017 1240   CL 107 12/27/2017 0950   CO2 26 12/27/2017 0950   CO2 24 02/17/2017 1240   BUN 13 12/27/2017 0950   BUN 7.5 02/17/2017 1240   CREATININE 0.85 12/27/2017 0950   CREATININE 0.9 02/17/2017 1240      Component Value Date/Time   CALCIUM 9.5 12/27/2017 0950   CALCIUM 9.6 02/17/2017 1240   ALKPHOS 78 12/27/2017 0950   ALKPHOS 65 02/17/2017 1240   AST 17 12/27/2017 0950   AST 22 02/17/2017 1240   ALT 20 12/27/2017 0950   ALT 52 02/17/2017 1240   BILITOT 0.2 (L) 12/27/2017 0950   BILITOT 0.36 02/17/2017 1240       RADIOGRAPHIC STUDIES: I have reviewed multiple imaging study with the patient I have personally reviewed the radiological images as listed and agreed with the findings in the report. Nm Pet Image Restag (ps) Skull Base To Thigh  Result Date: 01/04/2018 CLINICAL DATA:  Subsequent treatment strategy for  cervical cancer. Cutaneous nodules in the subcu region concerning for tumor recurrence. EXAM: NUCLEAR MEDICINE PET SKULL BASE TO THIGH TECHNIQUE: 7.1 mCi F-18 FDG was injected intravenously. Full-ring PET imaging was performed from the skull base to thigh after the radiotracer. CT data was obtained and used for attenuation correction and anatomic localization.  Fasting blood glucose: 99 mg/dl COMPARISON:  CT 09/27/2017 FINDINGS: Mediastinal blood pool activity: SUV max 1.9 NECK: No hypermetabolic supraclavicular or cervical lymphadenopathy. No hypermetabolic nodularity in the posterior neck or occipital region. One small subcutaneous nodule on the LEFT scalp superficial to the paraspinal musculature measures 5 mm (image 20/4) without associated metabolic activity. Lesion is increased in size from comparison exam Incidental CT findings: none CHEST: New rounded nodule in the RIGHT upper lobe measures 6 mm (image 13/8). This nodule is hypermetabolic with SUV max equal 3.0. Newly enlarged hypermetabolic RIGHT lower paratracheal lymph node measures 8 mm short axis (image 62/4) with SUV max 5.9. Mild RIGHT hilar activity with SUV max equal 4.73. Associated node identified. Incidental CT findings: none ABDOMEN/PELVIS: No abnormal hypermetabolic activity within the liver, pancreas, adrenal glands, or spleen. No hypermetabolic lymph nodes in the abdomen or pelvis. No no focal activity at the uterine cervix. No hypermetabolic pelvic lymph nodes. Incidental CT findings: none SKELETON: No focal hypermetabolic activity to suggest skeletal metastasis. Incidental CT findings: none IMPRESSION: 1. New hypermetabolic RIGHT upper lobe pulmonary nodule coupled with new hypermetabolic RIGHT paratracheal lymph node. Differential includes small pulmonary infection with reactive node versus pulmonary metastasis and mediastinal nodal metastasis. As pulmonary lesion was not present 3 months prior infection is a distinct possibility however  the intensity of the paratracheal node favors malignancy. Consider short-term follow-up CT with contrast (1 month). If lesion persists or enlarges malignancy is likely. 2. No evidence of abnormal metabolic the uterine cervix. No pelvic adenopathy. 3. No evidence of malignancy in the occipital region. Electronically Signed   By: Suzy Bouchard M.D.   On: 01/04/2018 15:52    All questions were answered. The patient knows to call the clinic with any problems, questions or concerns. No barriers to learning was detected.  I spent 15 minutes counseling the patient face to face. The total time spent in the appointment was 20 minutes and more than 50% was on counseling and review of test results  Heath Lark, MD 01/05/2018 9:30 AM

## 2018-01-05 NOTE — Assessment & Plan Note (Signed)
The skin nodule on her scalp is not hypermetabolic Recommend observation only for now

## 2018-01-25 ENCOUNTER — Encounter: Payer: Self-pay | Admitting: Hematology and Oncology

## 2018-01-26 ENCOUNTER — Telehealth: Payer: Self-pay

## 2018-01-26 NOTE — Telephone Encounter (Signed)
Called and given radiology scheduler number, she will call and schedule.

## 2018-01-26 NOTE — Telephone Encounter (Signed)
-----   Message from Gaspar Bidding sent at 01/26/2018 10:25 AM EDT ----- Regarding: RE: scan Referral authorized on 7/25. ----- Message ----- From: Flo Shanks, RN Sent: 01/26/2018   8:48 AM To: Gaspar Bidding Subject: scan                                           Hi Darlena!  Has the CT scan been authorized? She sent a message.  Thanks!  Hassan Rowan

## 2018-02-02 ENCOUNTER — Encounter (HOSPITAL_COMMUNITY): Payer: Self-pay

## 2018-02-02 ENCOUNTER — Ambulatory Visit (HOSPITAL_COMMUNITY)
Admission: RE | Admit: 2018-02-02 | Discharge: 2018-02-02 | Disposition: A | Discharge status: Home / Self Care | Payer: Self-pay | Source: Ambulatory Visit | Attending: Hematology and Oncology | Admitting: Hematology and Oncology

## 2018-02-02 DIAGNOSIS — Z720 Tobacco use: Secondary | ICD-10-CM | POA: Insufficient documentation

## 2018-02-02 DIAGNOSIS — C530 Malignant neoplasm of endocervix: Secondary | ICD-10-CM | POA: Insufficient documentation

## 2018-02-02 DIAGNOSIS — R911 Solitary pulmonary nodule: Secondary | ICD-10-CM | POA: Insufficient documentation

## 2018-02-02 MED ORDER — IOHEXOL 300 MG/ML  SOLN
75.0000 mL | Freq: Once | INTRAMUSCULAR | Status: AC | PRN
  Administered 2018-02-02: 75 mL via INTRAVENOUS

## 2018-02-03 ENCOUNTER — Inpatient Hospital Stay: Payer: Self-pay | Attending: Hematology and Oncology | Admitting: Hematology and Oncology

## 2018-02-03 ENCOUNTER — Encounter: Payer: Self-pay | Admitting: Hematology and Oncology

## 2018-02-03 ENCOUNTER — Telehealth: Payer: Self-pay | Admitting: Hematology and Oncology

## 2018-02-03 VITALS — BP 98/61 | HR 98 | Temp 98.0°F | Resp 18 | Ht 62.0 in | Wt 148.8 lb

## 2018-02-03 DIAGNOSIS — R911 Solitary pulmonary nodule: Secondary | ICD-10-CM

## 2018-02-03 DIAGNOSIS — Z8541 Personal history of malignant neoplasm of cervix uteri: Secondary | ICD-10-CM

## 2018-02-03 DIAGNOSIS — R918 Other nonspecific abnormal finding of lung field: Secondary | ICD-10-CM

## 2018-02-03 DIAGNOSIS — Z87891 Personal history of nicotine dependence: Secondary | ICD-10-CM

## 2018-02-03 DIAGNOSIS — Z9221 Personal history of antineoplastic chemotherapy: Secondary | ICD-10-CM

## 2018-02-03 DIAGNOSIS — Z923 Personal history of irradiation: Secondary | ICD-10-CM

## 2018-02-03 DIAGNOSIS — N760 Acute vaginitis: Secondary | ICD-10-CM | POA: Insufficient documentation

## 2018-02-03 DIAGNOSIS — C530 Malignant neoplasm of endocervix: Secondary | ICD-10-CM

## 2018-02-03 NOTE — Assessment & Plan Note (Signed)
This is stable Malignancy cannot be excluded It is not in a situation where biopsy is possible I recommend surveillance imaging again in 2 months and she agreed

## 2018-02-03 NOTE — Progress Notes (Signed)
[facility] OFFICE PROGRESS NOTE  Patient Care Team: Delbert Harness as PCP - General (Family Medicine)  ASSESSMENT & PLAN:  Cervical cancer [facility]) I have reviewed multiple imaging studies with the patient and family CT scan of the chest is stable The patient has quit smoking I recommend repeat follow-up in 2 months  Lesion of right lung This is stable Malignancy cannot be excluded It is not in a situation where biopsy is possible I recommend surveillance imaging again in 2 months and she agreed   Orders Placed This Encounter  Procedures  . CT CHEST W CONTRAST    Standing Status:   Future    Standing Expiration Date:   02/04/2019    Order Specific Question:   If indicated for the ordered procedure, I authorize the administration of contrast media per Radiology protocol    Answer:   Yes    Order Specific Question:   Preferred imaging location?    Answer:   [place] [facility]    Order Specific Question:   Radiology Contrast Protocol - do NOT remove file path    Answer:   [email or web address]    Order Specific Question:   Is patient pregnant?    Answer:   No  . CBC with Differential/Platelet    Standing Status:   Future    Standing Expiration Date:   03/10/2019  . Comprehensive metabolic panel    Standing Status:   Future    Standing Expiration Date:   03/10/2019    INTERVAL HISTORY: Please see below for problem oriented charting. She returns for further follow-up The patient has successfully quit smoking She has completed antibiotic treatment Denies cough, chest pain or shortness of breath No new lymphadenopathy No recent vaginal bleeding  SUMMARY OF ONCOLOGIC HISTORY:   Adenocarcinoma (GYN origin) (Resolved)   09/01/2016 Initial Diagnosis    Adenocarcinoma (GYN origin)     Cervical cancer ([place])   08/26/2016 Pathology Results    Adequacy Reason Satisfactory for evaluation, endocervical/transformation zone component  PRESENT. Diagnosis ATYPICAL GLANDULAR CELLS. SEE COMMENT. COMMENT: THERE ARE GROUPS OF ATYPICAL GLANDULAR CELLS WHICH APPEAR TO BE OF ENDOCERVICAL ORIGIN. TISSUE STUDIES, SUCH AS A CURETTAGE, MAY HELP BETTER EVALUATE THE EXTENT AND SEVERITY OF THESE ATYPICAL CELLS.    09/01/2016 Initial Diagnosis    The patient reports she is not had Pap smears in the "many years". She presented initially with flank pain to the emergency room on 07/29/2016. She was treated for a UTI and had resolution of her flank pain although she continued to have suprapubic pain. She has had irregular bleeding and a copious discharge for several months. 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 Microscopic Comment Immunohistochemistry is attempted to determine endocervical vs. endometrial origin. CEA is focally positive, p16 is diffusely strongly positive, ER is weakly positive, and vimentin is positive. This profile is not specific as to origin and clinical correlation is recommended. Dr. Avis Epley has reviewed the case. The case was called to Dr. Ilda Basset on 09/06/2016    10/05/2016 Imaging    Ct abdomen: 4.7 cm heterogeneous enhancing mass in the cervix and lower uterine segment, consistent with known cervical carcinoma. Mild retroperitoneal lymphadenopathy in the inferior aortocaval space and right common iliac chain, suspicious for metastatic disease.    10/29/2016 Procedure  Ultrasound and fluoroscopically guided right internal jugular single lumen power port catheter insertion. Tip in the SVC/RA junction. Catheter ready for use    11/09/2016 - 01/26/2017 Radiation Therapy    Radiation treatment dates:   11/09/2016 - 12/23/2016 and HDR on 01/04/2017, 01/10/2017, 01/17/2017, 01/24/2017, 01/26/2017  Site/dose:    1) The cervix was  treated to 45 Gy in 25 fractions,   2) The pelvis was treated to 9 Gy in 5 fractions as a boost to the suspicious pelvic  nodes. (54 Gy) 3) Sclav-LT was initially prescribed to 60 Gy in 30 fractions, but the patient discontinued at fraction 28 due to severe skin reactions. (56 Gy) 4) The cervix was also treated to 27.5 Gy in 5 fractions of 5.5 Gy.  Beams/energy:   1) Cervix: 3D // Photon 2) pelvic Boost: IMRT // Photon 3) Sclav-Lt: 3D // 6X, 10X Photon 4) HDR Ir-192 // Brachytherapy    11/09/2016 - 12/08/2016 Chemotherapy    She receives weekly cisplatin    03/15/2017 Procedure    Removal of implanted Port-A-Cath utilizing sharp and blunt dissection. The procedure was uncomplicated.    01/04/2018 PET scan    1. New hypermetabolic RIGHT upper lobe pulmonary nodule coupled with new hypermetabolic RIGHT paratracheal lymph node. Differential includes small pulmonary infection with reactive node versus pulmonary metastasis and mediastinal nodal metastasis. As pulmonary lesion was not present 3 months prior infection is a distinct possibility however the intensity of the paratracheal node favors malignancy. Consider short-term follow-up CT with contrast (1 month). If lesion persists or enlarges malignancy is likely. 2. No evidence of abnormal metabolic the uterine cervix. No pelvic adenopathy. 3. No evidence of malignancy in the occipital region.    02/02/2018 Imaging    Stable appearance of the 6 mm right upper lobe pulmonary nodule with no change in the right paratracheal and precarinal lymph nodes. Given hypermetabolism seen on the PET-CT 1 month ago, continued close attention will be required.     REVIEW OF SYSTEMS:   Constitutional: Denies fevers, chills or abnormal weight loss Eyes: Denies blurriness of vision Ears, nose, mouth, throat, and face: Denies mucositis or sore throat Respiratory: Denies cough, dyspnea or wheezes Cardiovascular: Denies palpitation, chest discomfort or lower  extremity swelling Gastrointestinal:  Denies nausea, heartburn or change in bowel habits Skin: Denies abnormal skin rashes Lymphatics: Denies new lymphadenopathy or easy bruising Neurological:Denies numbness, tingling or new weaknesses Behavioral/Psych: Mood is stable, no new changes  All other systems were reviewed with the patient and are negative.  I have reviewed the past medical history, past surgical history, social history and family history with the patient and they are unchanged from previous note.  ALLERGIES:  is allergic to amoxicillin and penicillins.  MEDICATIONS:  No current outpatient medications on file.   No current facility-administered medications for this visit.     PHYSICAL EXAMINATION: ECOG PERFORMANCE STATUS: 0 - Asymptomatic  Vitals:   02/03/18 1230  BP: 98/61  Pulse: 98  Resp: 18  Temp: 98 F (36.7 C)  SpO2: 98%   Filed Weights   02/03/18 1230  Weight: 148 lb 12.8 oz (67.5 kg)    GENERAL:alert, no distress and comfortable SKIN: skin color, texture, turgor are normal, no rashes or significant lesions EYES: normal, Conjunctiva are pink and non-injected, sclera clear OROPHARYNX:no exudate, no erythema and lips, buccal mucosa, and tongue normal  NECK: supple, thyroid normal size, non-tender, without nodularity LYMPH:  no palpable lymphadenopathy in the cervical, axillary or inguinal LUNGS: clear  to auscultation and percussion with normal breathing effort HEART: regular rate & rhythm and no murmurs and no lower extremity edema ABDOMEN:abdomen soft, non-tender and normal bowel sounds Musculoskeletal:no cyanosis of digits and no clubbing  NEURO: alert & oriented x 3 with fluent speech, no focal motor/sensory deficits  LABORATORY DATA:  I have reviewed the data as listed    Component Value Date/Time   NA 141 12/27/2017 0950   NA 137 02/17/2017 1240   K 3.4 (L) 12/27/2017 0950   K 3.6 02/17/2017 1240   CL 107 12/27/2017 0950   CO2 26 12/27/2017  0950   CO2 24 02/17/2017 1240   GLUCOSE 113 (H) 12/27/2017 0950   GLUCOSE 121 02/17/2017 1240   BUN 13 12/27/2017 0950   BUN 7.5 02/17/2017 1240   CREATININE 0.85 12/27/2017 0950   CREATININE 0.9 02/17/2017 1240   CALCIUM 9.5 12/27/2017 0950   CALCIUM 9.6 02/17/2017 1240   PROT 7.0 12/27/2017 0950   PROT 7.0 02/17/2017 1240   ALBUMIN 3.7 12/27/2017 0950   ALBUMIN 3.6 02/17/2017 1240   AST 17 12/27/2017 0950   AST 22 02/17/2017 1240   ALT 20 12/27/2017 0950   ALT 52 02/17/2017 1240   ALKPHOS 78 12/27/2017 0950   ALKPHOS 65 02/17/2017 1240   BILITOT 0.2 (L) 12/27/2017 0950   BILITOT 0.36 02/17/2017 1240   GFRNONAA >60 12/27/2017 0950   GFRAA >60 12/27/2017 0950    No results found for: SPEP, UPEP  Lab Results  Component Value Date   WBC 4.5 12/27/2017   NEUTROABS 3.2 12/27/2017   HGB 12.6 12/27/2017   HCT 38.0 12/27/2017   MCV 88.2 12/27/2017   PLT 289 12/27/2017      Chemistry      Component Value Date/Time   NA 141 12/27/2017 0950   NA 137 02/17/2017 1240   K 3.4 (L) 12/27/2017 0950   K 3.6 02/17/2017 1240   CL 107 12/27/2017 0950   CO2 26 12/27/2017 0950   CO2 24 02/17/2017 1240   BUN 13 12/27/2017 0950   BUN 7.5 02/17/2017 1240   CREATININE 0.85 12/27/2017 0950   CREATININE 0.9 02/17/2017 1240      Component Value Date/Time   CALCIUM 9.5 12/27/2017 0950   CALCIUM 9.6 02/17/2017 1240   ALKPHOS 78 12/27/2017 0950   ALKPHOS 65 02/17/2017 1240   AST 17 12/27/2017 0950   AST 22 02/17/2017 1240   ALT 20 12/27/2017 0950   ALT 52 02/17/2017 1240   BILITOT 0.2 (L) 12/27/2017 0950   BILITOT 0.36 02/17/2017 1240       RADIOGRAPHIC STUDIES: I have reviewed imaging study with the patient I have personally reviewed the radiological images as listed and agreed with the findings in the report. Ct Chest W Contrast  Result Date: 02/02/2018 CLINICAL DATA:  Cervical cancer with new hypermetabolic right upper lobe pulmonary nodule and hypermetabolic right  paratracheal lymph node on PET imaging. EXAM: CT CHEST WITH CONTRAST TECHNIQUE: Multidetector CT imaging of the chest was performed during intravenous contrast administration. CONTRAST:  14mL OMNIPAQUE IOHEXOL 300 MG/ML  SOLN COMPARISON:  PET-CT 01/04/2018. FINDINGS: Cardiovascular: The heart size is normal. No substantial pericardial effusion. Mediastinum/Nodes: 8 mm paratracheal lymph node measured on the prior study is stable at 8 mm. Precarinal lymph node also measures about 8 mm in is unchanged. 10 mm short axis right hilar lymph node is better seen on today's study with intravenous contrast material. The esophagus has normal imaging features. There is no  axillary lymphadenopathy. Lungs/Pleura: Mucus is noted in the left mainstem bronchus. 6 mm nodule posterior right upper lobe is stable at 6 mm (5:36). 4 mm ground-glass nodule left apex is unchanged. Lungs otherwise unremarkable. Upper Abdomen: Unremarkable. Musculoskeletal: No worrisome lytic or sclerotic osseous abnormality. IMPRESSION: 1. Stable appearance of the 6 mm right upper lobe pulmonary nodule with no change in the right paratracheal and precarinal lymph nodes. Given hypermetabolism seen on the PET-CT 1 month ago, continued close attention will be required. Electronically Signed   By: Misty Stanley M.D.   On: 02/02/2018 12:20   Nm Pet Image Restag (ps) Skull Base To Thigh  Result Date: 01/04/2018 CLINICAL DATA:  Subsequent treatment strategy for cervical cancer. Cutaneous nodules in the subcu region concerning for tumor recurrence. EXAM: NUCLEAR MEDICINE PET SKULL BASE TO THIGH TECHNIQUE: 7.1 mCi F-18 FDG was injected intravenously. Full-ring PET imaging was performed from the skull base to thigh after the radiotracer. CT data was obtained and used for attenuation correction and anatomic localization. Fasting blood glucose: 99 mg/dl COMPARISON:  CT 09/27/2017 FINDINGS: Mediastinal blood pool activity: SUV max 1.9 NECK: No hypermetabolic  supraclavicular or cervical lymphadenopathy. No hypermetabolic nodularity in the posterior neck or occipital region. One small subcutaneous nodule on the LEFT scalp superficial to the paraspinal musculature measures 5 mm (image 20/4) without associated metabolic activity. Lesion is increased in size from comparison exam Incidental CT findings: none CHEST: New rounded nodule in the RIGHT upper lobe measures 6 mm (image 13/8). This nodule is hypermetabolic with SUV max equal 3.0. Newly enlarged hypermetabolic RIGHT lower paratracheal lymph node measures 8 mm short axis (image 62/4) with SUV max 5.9. Mild RIGHT hilar activity with SUV max equal 4.73. Associated node identified. Incidental CT findings: none ABDOMEN/PELVIS: No abnormal hypermetabolic activity within the liver, pancreas, adrenal glands, or spleen. No hypermetabolic lymph nodes in the abdomen or pelvis. No no focal activity at the uterine cervix. No hypermetabolic pelvic lymph nodes. Incidental CT findings: none SKELETON: No focal hypermetabolic activity to suggest skeletal metastasis. Incidental CT findings: none IMPRESSION: 1. New hypermetabolic RIGHT upper lobe pulmonary nodule coupled with new hypermetabolic RIGHT paratracheal lymph node. Differential includes small pulmonary infection with reactive node versus pulmonary metastasis and mediastinal nodal metastasis. As pulmonary lesion was not present 3 months prior infection is a distinct possibility however the intensity of the paratracheal node favors malignancy. Consider short-term follow-up CT with contrast (1 month). If lesion persists or enlarges malignancy is likely. 2. No evidence of abnormal metabolic the uterine cervix. No pelvic adenopathy. 3. No evidence of malignancy in the occipital region. Electronically Signed   By: Suzy Bouchard M.D.   On: 01/04/2018 15:52    All questions were answered. The patient knows to call the clinic with any problems, questions or concerns. No barriers to  learning was detected.  I spent 10 minutes counseling the patient face to face. The total time spent in the appointment was 15 minutes and more than 50% was on counseling and review of test results  Heath Lark, MD 02/03/2018 2:19 PM

## 2018-02-03 NOTE — Assessment & Plan Note (Signed)
I have reviewed multiple imaging studies with the patient and family CT scan of the chest is stable The patient has quit smoking I recommend repeat follow-up in 2 months

## 2018-02-03 NOTE — Telephone Encounter (Signed)
Gave patient avs and calendar.   °

## 2018-02-15 ENCOUNTER — Emergency Department (HOSPITAL_COMMUNITY)
Admission: EM | Admit: 2018-02-15 | Discharge: 2018-02-15 | Disposition: A | Discharge status: Home / Self Care | Payer: Self-pay | Attending: Emergency Medicine | Admitting: Emergency Medicine

## 2018-02-15 ENCOUNTER — Encounter (HOSPITAL_COMMUNITY): Payer: Self-pay

## 2018-02-15 DIAGNOSIS — N760 Acute vaginitis: Secondary | ICD-10-CM | POA: Insufficient documentation

## 2018-02-15 DIAGNOSIS — N739 Female pelvic inflammatory disease, unspecified: Secondary | ICD-10-CM | POA: Insufficient documentation

## 2018-02-15 DIAGNOSIS — B9689 Other specified bacterial agents as the cause of diseases classified elsewhere: Secondary | ICD-10-CM

## 2018-02-15 DIAGNOSIS — F17210 Nicotine dependence, cigarettes, uncomplicated: Secondary | ICD-10-CM | POA: Insufficient documentation

## 2018-02-15 DIAGNOSIS — N730 Acute parametritis and pelvic cellulitis: Secondary | ICD-10-CM

## 2018-02-15 DIAGNOSIS — R1030 Lower abdominal pain, unspecified: Secondary | ICD-10-CM

## 2018-02-15 LAB — COMPREHENSIVE METABOLIC PANEL
ALT: 28 U/L (ref 0–44)
AST: 27 U/L (ref 15–41)
Albumin: 4.3 g/dL (ref 3.5–5.0)
Alkaline Phosphatase: 67 U/L (ref 38–126)
Anion gap: 11 (ref 5–15)
BILIRUBIN TOTAL: 0.9 mg/dL (ref 0.3–1.2)
BUN: 10 mg/dL (ref 6–20)
CALCIUM: 9.8 mg/dL (ref 8.9–10.3)
CHLORIDE: 106 mmol/L (ref 98–111)
CO2: 21 mmol/L — ABNORMAL LOW (ref 22–32)
CREATININE: 1.04 mg/dL — AB (ref 0.44–1.00)
Glucose, Bld: 120 mg/dL — ABNORMAL HIGH (ref 70–99)
Potassium: 3.5 mmol/L (ref 3.5–5.1)
Sodium: 138 mmol/L (ref 135–145)
TOTAL PROTEIN: 7.4 g/dL (ref 6.5–8.1)

## 2018-02-15 LAB — URINALYSIS, ROUTINE W REFLEX MICROSCOPIC
BILIRUBIN URINE: NEGATIVE
GLUCOSE, UA: NEGATIVE mg/dL
KETONES UR: 20 mg/dL — AB
NITRITE: NEGATIVE
PH: 5 (ref 5.0–8.0)
Protein, ur: NEGATIVE mg/dL
Specific Gravity, Urine: 1.02 (ref 1.005–1.030)

## 2018-02-15 LAB — WET PREP, GENITAL
SPERM: NONE SEEN
TRICH WET PREP: NONE SEEN
YEAST WET PREP: NONE SEEN

## 2018-02-15 LAB — CBC
HCT: 39.2 % (ref 36.0–46.0)
Hemoglobin: 13 g/dL (ref 12.0–15.0)
MCH: 28.7 pg (ref 26.0–34.0)
MCHC: 33.2 g/dL (ref 30.0–36.0)
MCV: 86.5 fL (ref 78.0–100.0)
PLATELETS: 350 10*3/uL (ref 150–400)
RBC: 4.53 MIL/uL (ref 3.87–5.11)
RDW: 12.9 % (ref 11.5–15.5)
WBC: 4.9 10*3/uL (ref 4.0–10.5)

## 2018-02-15 LAB — LIPASE, BLOOD: LIPASE: 29 U/L (ref 11–51)

## 2018-02-15 LAB — I-STAT BETA HCG BLOOD, ED (MC, WL, AP ONLY): I-stat hCG, quantitative: <5 m[IU]/mL (ref <5)

## 2018-02-15 MED ORDER — DOXYCYCLINE HYCLATE 100 MG PO CAPS: 100.0000 mg | ORAL_CAPSULE | Freq: Two times a day (BID) | ORAL | 0 refills | Status: AC

## 2018-02-15 MED ORDER — METRONIDAZOLE 500 MG PO TABS: 500.0000 mg | ORAL_TABLET | Freq: Two times a day (BID) | ORAL | 0 refills | Status: AC

## 2018-02-15 MED ORDER — DIPHENHYDRAMINE HCL 25 MG PO CAPS
25.0000 mg | ORAL_CAPSULE | Freq: Once | ORAL | Status: AC
  Administered 2018-02-15: 25 mg via ORAL
  Filled 2018-02-15: qty 1

## 2018-02-15 MED ORDER — SODIUM CHLORIDE 0.9 % IV BOLUS
1000.0000 mL | Freq: Once | INTRAVENOUS | Status: AC
  Administered 2018-02-15: 1000 mL via INTRAVENOUS

## 2018-02-15 MED ORDER — MORPHINE SULFATE (PF) 4 MG/ML IV SOLN
4.0000 mg | Freq: Once | INTRAVENOUS | Status: AC
  Administered 2018-02-15: 4 mg via INTRAVENOUS
  Filled 2018-02-15: qty 1

## 2018-02-15 MED ORDER — ONDANSETRON HCL 4 MG/2ML IJ SOLN
4.0000 mg | Freq: Once | INTRAMUSCULAR | Status: AC
  Administered 2018-02-15: 4 mg via INTRAVENOUS
  Filled 2018-02-15: qty 2

## 2018-02-15 MED ORDER — CEFTRIAXONE SODIUM 250 MG IJ SOLR
250.0000 mg | Freq: Once | INTRAMUSCULAR | Status: AC
  Administered 2018-02-15: 250 mg via INTRAMUSCULAR
  Filled 2018-02-15: qty 250

## 2018-02-15 NOTE — ED Provider Notes (Signed)
[place] EMERGENCY DEPARTMENT Provider Note   CSN: [id number] Arrival date & time: 02/15/18  1141     History   Chief Complaint Chief Complaint  Patient presents with  . Abdominal Pain    HPI Joanne Griffin is a 32 y.o. female with a history of cervical cancer status post chemotherapy and radiation who presents today for suprapubic abdominal pain.  Patient reports that she had gradual onset of abdominal pain that began yesterday.  She was seen at outside ER yesterday and had workup done that was unremarkable.  CT scan results reviewed from care without appendicitis, obstruction or other pathology.  Patient presented today because she reports that she has not told any of the results and was discharged home with tramadol and Zofran.  She reports that the tramadol has been helped she is unsure what is causing it she is concerned.  She reports the pain has been constant since onset gradually worsened overnight until she was able to take tramadol.  She reports no associated fever, nausea, vomiting, chills, upper abdominal pain, flank pain, urinary frequency, urgency, dysuria, hematuria, vaginal discharge, vaginal bleeding.  She is sexually active with her wife.  She denies any other sexual partners.  She denies history of STDs.  No nausea/vomiting/diarrhea.  She reports normal bowel movement yesterday morning.  No melena or hematochezia.  No previous abdominal surgeries.  HPI  Past Medical History:  Diagnosis Date  . Anxiety   . 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 Depression   . History of cancer chemotherapy 11-09-2016 to 12-08-2016  . History of radiation therapy 11-09-2016  to 12-22-2016   cervix was treated to 45 Gy, pelvis was treated to 9 Gy, Sclav left was treated to  56 Gy.  Marland Kitchen History of radiation therapy 01/04/17, 01/10/17, 01/17/17, 01/24/17, 01/26/17   cervix was treated to 27.5 Gy in 5 fractions  . Leukopenia due to antineoplastic chemotherapy [facility])     Patient Active Problem List   Diagnosis Date Noted  . Lesion of right lung 01/05/2018  . Port catheter in place 11/02/2016  . Depression with anxiety 10/21/2016  . Cervical cancer ([place]) 10/20/2016  . Oligomenorrhea 09/02/2016    Past Surgical History:  Procedure Laterality Date  . CYST REMOVAL HAND  age 2  . IR FLUORO GUIDE PORT INSERTION RIGHT  10/29/2016  . IR REMOVAL TUN ACCESS W/ PORT W/O FL MOD SED  03/15/2017  . IR US GUIDE VASC ACCESS RIGHT  10/29/2016  . TANDEM RING INSERTION N/A 01/04/2017   Procedure: TANDEM RING INSERTION;  Surgeon: Gery Pray, MD;  Location: [facility];  Service: Urology;  Laterality: N/A;  . TANDEM RING INSERTION N/A 01/10/2017   Procedure: TANDEM RING INSERTION;  Surgeon: Gery Pray, MD;  Location: [facility];  Service: Urology;  Laterality: N/A;  . TANDEM RING INSERTION N/A 01/17/2017   Procedure: TANDEM RING INSERTION;  Surgeon: Gery Pray, MD;  Location: [facility];  Service: Urology;  Laterality: N/A;  . TANDEM RING INSERTION N/A 01/24/2017   Procedure: TANDEM RING INSERTION;  Surgeon: Gery Pray, MD;  Location: [facility];  Service: Urology;  Laterality: N/A;  . TANDEM RING INSERTION N/A 01/26/2017   Procedure: TANDEM RING INSERTION;  Surgeon: Gery Pray, MD;  Location: [facility];  Service:  Urology;  Laterality: N/A;  . WISDOM TOOTH EXTRACTION     times 4     OB History    Gravida  0   Para  0   Term  0   Preterm  0   AB  0   Living  0     SAB  0   TAB  0   Ectopic  0   Multiple  0   Live Births  0            Home Medications    Prior to Admission medications   Medication Sig Start Date End Date Taking? Authorizing Provider  ondansetron  (ZOFRAN-ODT) 4 MG disintegrating tablet Take 4 mg by mouth every 8 (eight) hours as needed for nausea/vomiting. 02/14/18 02/21/18  [provider]  traMADol (ULTRAM) 50 MG tablet Take 50 mg by mouth every 6 (six) hours as needed for pain. 02/14/18 02/19/18  [provider]    Family History Family History  Problem Relation Age of Onset  . Diabetes Maternal Grandmother   . Breast cancer Paternal Grandmother     Social History Social History   Tobacco Use  . Smoking status: Current Every Day Smoker    Packs/day: 0.50    Years: 12.00    Pack years: 6.00    Types: Cigarettes  . Smokeless tobacco: Never Used  . Tobacco comment: 10 cig per day  Substance Use Topics  . Alcohol use: No  . Drug use: No     Allergies   Amoxicillin and Penicillins   Review of Systems Review of Systems  All other systems reviewed and are negative.    Physical Exam Updated Vital Signs BP 128/65 (BP Location: Right Arm)   Pulse 92   Temp 98.1 F (36.7 C) (Oral)   Resp 14   Ht 5\' 2"  (1.575 m)   Wt 68 kg   SpO2 100%   BMI 27.44 kg/m   Physical Exam  Constitutional: She appears well-developed and well-nourished.  HENT:  Head: Normocephalic and atraumatic.  Right Ear: External ear normal.  Left Ear: External ear normal.  Nose: Nose normal.  Mouth/Throat: Uvula is midline, oropharynx is clear and moist and mucous membranes are normal. No tonsillar exudate.  Eyes: Pupils are equal, round, and reactive to light. Right eye exhibits no discharge. Left eye exhibits no discharge. No scleral icterus.  Neck: Trachea normal. Neck supple. No spinous process tenderness present. No neck rigidity. Normal range of motion present.  Cardiovascular: Normal rate, regular rhythm and intact distal pulses.  No murmur heard. Pulses:      Radial pulses are 2+ on the right side, and 2+ on the left side.       Dorsalis pedis pulses are 2+ on the right side, and 2+ on the left side.       Posterior  tibial pulses are 2+ on the right side, and 2+ on the left side.  No lower extremity swelling or edema. Calves symmetric in size bilaterally.  Pulmonary/Chest: Effort normal and breath sounds normal. She exhibits no tenderness.  Abdominal: Soft. Bowel sounds are normal. She exhibits no distension. There is tenderness in the suprapubic area. There is no rigidity, no rebound, no guarding, no CVA tenderness, no tenderness at McBurney's point and negative Murphy's sign.    Genitourinary:  Genitourinary Comments: Exam performed by Jillyn Ledger, exam chaperoned Pelvic exam: normal external genitalia without evidence of trauma. VULVA: normal appearing vulva with no masses, tenderness or  lesion. VAGINA: normal appearing vagina with normal color and discharge, no lesions. CERVIX: Friable cervix, Cervical motion tenderness present, cervical os closed with out purulent discharge; vaginal discharge - white and scant, Wet prep and DNA probe for chlamydia and GC obtained.   ADNEXA: normal adnexa in size, nontender and no masses UTERUS: uterus is normal size, shape, consistency and nontender.   Musculoskeletal: She exhibits no edema.  Lymphadenopathy:    She has no cervical adenopathy.  Neurological: She is alert.  Skin: Skin is warm and dry. No rash noted. She is not diaphoretic.  Psychiatric: She has a normal mood and affect.  Nursing note and vitals reviewed.    ED Treatments / Results  Labs (all labs ordered are listed, but only abnormal results are displayed) Labs Reviewed  WET PREP, GENITAL - Abnormal; Notable for the following components:      Result Value   Clue Cells Wet Prep HPF POC PRESENT (*)    WBC, Wet Prep HPF POC MANY (*)    All other components within normal limits  COMPREHENSIVE METABOLIC PANEL - Abnormal; Notable for the following components:   CO2 21 (*)    Glucose, Bld 120 (*)    Creatinine, Ser 1.04 (*)    All other components within normal limits  URINALYSIS,  ROUTINE W REFLEX MICROSCOPIC - Abnormal; Notable for the following components:   APPearance HAZY (*)    Hgb urine dipstick MODERATE (*)    Ketones, ur 20 (*)    Leukocytes, UA LARGE (*)    Bacteria, UA RARE (*)    All other components within normal limits  URINE CULTURE  LIPASE, BLOOD  CBC  RPR  HIV ANTIBODY (ROUTINE TESTING)  I-STAT BETA HCG BLOOD, ED ([facility], [facility], [facility] ONLY)  GC/CHLAMYDIA PROBE AMP () NOT AT [facility]    EKG None  Radiology No results found.  Procedures Procedures (including critical care time)  Medications Ordered in ED Medications  sodium chloride 0.9 % bolus 1,000 mL (has no administration in time range)  morphine 4 MG/ML injection 4 mg (has no administration in time range)  ondansetron (ZOFRAN) injection 4 mg (has no administration in time range)     Initial Impression / Assessment and Plan / ED Course  I have reviewed the triage vital signs and the nursing notes.  Pertinent labs & imaging results that were available during my care of the patient were reviewed by me and considered in my medical decision making (see chart for details).     32 y.o. female with a history of cervical cancer status post chemotherapy and radiation who presents today for suprapubic abdominal pain and pelvic pain that began yesterday. No assc symptoms.  No fever, chills, diarrhea, urinary symptoms, vaginal discharge.  She is monogamous relationship with her wife.  She was seen at ER in [place] yesterday. CT scan results reviewed from care everywhere without evidence of appendictis, obstruction or other pathology.  She reports improvement of pain today but is unsure what is causing her symptoms. No pelvic exam performed yesterday. Pelvic exam performed today with a friable cervix sense of motion.  Wet prep,  Gonorrhea, Chalmydia, HIV and RPR obtained.  Lab work obtained and otherwise reassuring.  No leukocytosis.  No anemia.  No significant elect light derangements.  Lipase  without evidence of pancreatitis.  LFTs within normal limits.  No evidence of DKA.  Pregnancy test is negative.  No concern for ectopic.  Wet prep with evidence of BV.  UA  questionable for UTI.  Will send urine culture.  This was not a clean-catch.  She will be treated for PID and BV.  1:51 PM Patient with PCN allergy. Discussed with pharmacist Apolonio Schneiders who recommended ceftriaxone IM given patient has had a previously without reaction.  Wet prep with BV. No evidence of trichomonas or yeast infection.  Treat with 14-day course of doxycycline 100 mg p.o. twice daily and metronidazole 500 mg p.o. twice daily.  Patient is without any adnexal tenderness.  She is without a leukocytosis.  Do not feel she needs a ultrasound to rule out torsion or tubo-ovarian abscess at this time. CT scan r/o appendicitis yesterday. No cytosis or right lower quadrant abdominal pain to make me concerned to order another CT scan today.  Patient does not meet the SIRS or Sepsis criteria.  On repeat exam patient does not have a surgical abd and there are no peritoneal signs.  No indication of appendicitis, bowel obstruction, bowel perforation, cholecystitis, diverticulitis,  ectopic pregnancy.  Patient discharged home with symptomatic treatment and given strict instructions for follow-up with their primary care physician and OBGYN  I have also discussed reasons to return immediately to the ER.  Patient expresses understanding and agrees with plan. Patient has pain and nausea medication at home.   Final Clinical Impressions(s) / ED Diagnoses   Final diagnoses:  PID (acute pelvic inflammatory disease)  Bacterial vaginosis    ED Discharge Orders         Ordered    doxycycline (VIBRAMYCIN) 100 MG capsule  2 times daily     02/15/18 1419    metroNIDAZOLE (FLAGYL) 500 MG tablet  2 times daily     02/15/18 1419           Lorelle Gibbs 02/15/18 1440    Sherwood Gambler, MD 02/15/18 1515

## 2018-02-15 NOTE — ED Triage Notes (Signed)
Pt presents for evaluation of lower abd pain starting around 12 yesterday. Reports she had fentanyl, morphine with no improvement. States had CT scan, unsure of results. Denies vaginal bleeding or discharge. States she hasnt urinated since yesterday.

## 2018-02-15 NOTE — Discharge Instructions (Signed)
Please not drink alcohol while taking Flagyl this will make you feel very ill Please note the doxycycline can cause sun sensitivity.  Please wear sunscreen while out in the sun.  Please take all of your antibiotics until finished!   You may develop abdominal discomfort or diarrhea from the antibiotic.  You may help offset this with probiotics which you can buy or get in yogurt. Do not eat or take the probiotics until 2 hours after your antibiotic. Do not take your medicine if develop an itchy rash, swelling in your mouth or lips, or difficulty breathing.  Please see attached handouts.  Please follow up with OBGYN and PCP this week.  If you develop worsening or new concerning symptoms you can return to the emergency department for re-evaluation.

## 2018-02-15 NOTE — ED Notes (Signed)
Informed of the need of a urine sample in lobby

## 2018-02-16 LAB — URINE CULTURE: Culture: NO GROWTH

## 2018-02-16 LAB — RPR: RPR Ser Ql: NONREACTIVE

## 2018-02-16 LAB — GC/CHLAMYDIA PROBE AMP (~~LOC~~) NOT AT ARMC
CHLAMYDIA, DNA PROBE: NEGATIVE
NEISSERIA GONORRHEA: NEGATIVE

## 2018-02-16 LAB — HIV ANTIBODY (ROUTINE TESTING W REFLEX): HIV Screen 4th Generation wRfx: NONREACTIVE

## 2018-02-17 ENCOUNTER — Encounter (HOSPITAL_COMMUNITY): Payer: Self-pay

## 2018-02-17 ENCOUNTER — Emergency Department (HOSPITAL_COMMUNITY)
Admission: EM | Admit: 2018-02-17 | Discharge: 2018-02-17 | Disposition: A | Discharge status: Home / Self Care | Payer: Self-pay | Attending: Emergency Medicine | Admitting: Emergency Medicine

## 2018-02-17 ENCOUNTER — Telehealth: Payer: Self-pay | Admitting: *Deleted

## 2018-02-17 ENCOUNTER — Telehealth: Payer: Self-pay | Admitting: Medical

## 2018-02-17 ENCOUNTER — Inpatient Hospital Stay (HOSPITAL_BASED_OUTPATIENT_CLINIC_OR_DEPARTMENT_OTHER): Payer: Self-pay | Admitting: Medical

## 2018-02-17 VITALS — BP 131/94 | HR 83 | Temp 97.6°F | Resp 18 | Ht 62.0 in | Wt 142.9 lb

## 2018-02-17 DIAGNOSIS — F17210 Nicotine dependence, cigarettes, uncomplicated: Secondary | ICD-10-CM | POA: Insufficient documentation

## 2018-02-17 DIAGNOSIS — Z8541 Personal history of malignant neoplasm of cervix uteri: Secondary | ICD-10-CM | POA: Insufficient documentation

## 2018-02-17 DIAGNOSIS — C530 Malignant neoplasm of endocervix: Secondary | ICD-10-CM

## 2018-02-17 DIAGNOSIS — N739 Female pelvic inflammatory disease, unspecified: Secondary | ICD-10-CM | POA: Insufficient documentation

## 2018-02-17 DIAGNOSIS — N760 Acute vaginitis: Secondary | ICD-10-CM

## 2018-02-17 LAB — COMPREHENSIVE METABOLIC PANEL
ALT: 24 U/L (ref 0–44)
AST: 20 U/L (ref 15–41)
Albumin: 4.2 g/dL (ref 3.5–5.0)
Alkaline Phosphatase: 63 U/L (ref 38–126)
Anion gap: 10 (ref 5–15)
BILIRUBIN TOTAL: 0.5 mg/dL (ref 0.3–1.2)
BUN: 12 mg/dL (ref 6–20)
CO2: 26 mmol/L (ref 22–32)
CREATININE: 0.98 mg/dL (ref 0.44–1.00)
Calcium: 9.9 mg/dL (ref 8.9–10.3)
Chloride: 101 mmol/L (ref 98–111)
GFR calc Af Amer: >60 mL/min (ref >60)
Glucose, Bld: 92 mg/dL (ref 70–99)
Potassium: 3.4 mmol/L — ABNORMAL LOW (ref 3.5–5.1)
Sodium: 137 mmol/L (ref 135–145)
TOTAL PROTEIN: 7.6 g/dL (ref 6.5–8.1)

## 2018-02-17 LAB — CBC WITH DIFFERENTIAL/PLATELET
Abs Immature Granulocytes: 0 10*3/uL (ref 0.0–0.1)
Basophils Absolute: 0 10*3/uL (ref 0.0–0.1)
Basophils Relative: 0 %
Eosinophils Absolute: 0.1 10*3/uL (ref 0.0–0.7)
Eosinophils Relative: 1 %
HCT: 40.2 % (ref 36.0–46.0)
Hemoglobin: 13.1 g/dL (ref 12.0–15.0)
Immature Granulocytes: 0 %
Lymphocytes Relative: 23 %
Lymphs Abs: 1.2 10*3/uL (ref 0.7–4.0)
MCH: 28.8 pg (ref 26.0–34.0)
MCHC: 32.6 g/dL (ref 30.0–36.0)
MCV: 88.4 fL (ref 78.0–100.0)
Monocytes Absolute: 0.3 10*3/uL (ref 0.1–1.0)
Monocytes Relative: 6 %
Neutro Abs: 3.6 10*3/uL (ref 1.7–7.7)
Neutrophils Relative %: 70 %
Platelets: 302 10*3/uL (ref 150–400)
RBC: 4.55 MIL/uL (ref 3.87–5.11)
RDW: 12.4 % (ref 11.5–15.5)
WBC: 5.1 10*3/uL (ref 4.0–10.5)

## 2018-02-17 LAB — URINALYSIS, ROUTINE W REFLEX MICROSCOPIC
Bilirubin Urine: NEGATIVE
GLUCOSE, UA: NEGATIVE mg/dL
Ketones, ur: 5 mg/dL — AB
Nitrite: NEGATIVE
PROTEIN: NEGATIVE mg/dL
Specific Gravity, Urine: 1.008 (ref 1.005–1.030)
pH: 5 (ref 5.0–8.0)

## 2018-02-17 LAB — LIPASE, BLOOD: LIPASE: 28 U/L (ref 11–51)

## 2018-02-17 MED ORDER — ONDANSETRON HCL 4 MG/2ML IJ SOLN
4.0000 mg | Freq: Once | INTRAMUSCULAR | Status: AC
  Administered 2018-02-17: 4 mg via INTRAVENOUS
  Filled 2018-02-17: qty 2

## 2018-02-17 MED ORDER — MORPHINE SULFATE (PF) 2 MG/ML IV SOLN
2.0000 mg | Freq: Once | INTRAVENOUS | Status: AC
  Administered 2018-02-17: 2 mg via INTRAVENOUS
  Filled 2018-02-17: qty 1

## 2018-02-17 NOTE — Telephone Encounter (Signed)
Joanne Griffin reports continued pelvic pain. Has been to the ED 2 times this week.  Will come in at 1130 to see Sandi Mealy, PA.

## 2018-02-17 NOTE — Progress Notes (Signed)
Pt seen by PA Lucianne Lei only in [facility], no RN assessment at this time.  PA aware.

## 2018-02-17 NOTE — Progress Notes (Signed)
[facility] Progress Note   Joanne Griffin [id number] 1986-01-27 32 y.o.  Joanne Griffin is managed by Dr. Heath Lark and Dr. 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  The patient's recent chart was reviewed extensively with her today.  She was seen at the [facility] emergency room on 02/14/2018 with a CT scan completed of the abdomen and pelvis with no pathology noted.  She then presented to the emergency room at [facility] on 02/15/2018 with a pelvic exam completed at that time.  The patient was diagnosed with bacterial vaginosis and suspected PID.  She was begun on therapy with doxycycline and metronidazole.  An appointment has been made for her to see Dr. [name] on 02/21/2018 at 9:15 AM.  She was instructed to continue using tramadol which she had been given at the hospital and to alternate with Tylenol, and Motrin as needed. She expresses understanding and agreement with this plan.  Unfortunately, it was brought to this provider's attention that the patient presented to the emergency room at [facility] immediately after she left this appointment.  Please see After Visit Summary for patient specific instructions.  Future Appointments  Date Time Provider [place]  02/21/2018  9:15 AM [name], MD [facility] None  02/23/2018  4:00 PM Gery Pray, MD [facility] None  03/23/2018 10:55 AM Donnamae Jude, MD [place]  04/05/2018  3:45 PM [facility] LAB 1 [facility] None  04/06/2018  1:00 PM Heath Lark, MD [facility] None    No orders of the defined types were placed in this encounter.      Subjective:   Patient ID:   Joanne Griffin is a 32 y.o. (DOB 1986-01-11) female.  Chief Complaint: No chief complaint on file.   HPI Joanne Griffin is a 32 year old female with a history of cervical cancer who has been followed by Dr. Heath Lark and was previously seen by Dr. [name].  She was last seen by Dr. Heath Lark on 02/03/2018 with CT scan showing stability.  She was scheduled to follow-up in 2 months. The patient presents to the clinic today with a report of a 3-day history of pelvic pain and sweats with nausea and vomiting this morning.  The patient's recent chart was reviewed extensively with her today.  She was seen at the [facility] emergency room on 02/14/2018 with a CT scan completed of the abdomen and pelvis with no pathology noted.  She then presented to the emergency room at [facility] on 02/15/2018 with a pelvic exam completed at that time.  The patient was diagnosed with bacterial vaginosis and suspected PID.  She was begun on therapy with doxycycline and metronidazole.  She presents today for ongoing pelvic pain despite her negative work-ups of this week.  She reports ongoing sweats but denies fevers or chills.  She states that she is urinating less frequently.  Medications: I have reviewed the patient's current medications.  Allergies:  Allergies  Allergen Reactions  . Amoxicillin Hives    Has patient had a PCN reaction causing immediate rash, facial/tongue/throat swelling, SOB or lightheadedness with hypotension: Yes Has patient had a PCN reaction causing severe rash involving mucus membranes or skin necrosis: Unknown Has  patient had a PCN reaction that required hospitalization: Unknown Has patient had a PCN reaction occurring within the last 10 years: No If all of the above answers are "NO", then may proceed with Cephalosporin use.   Marland Kitchen Penicillins Other (See Comments)    UNKNOWN CHILDHOOD REACTION    Past Medical History:  Diagnosis Date  . Anxiety   . Cervical cancer [facility])  oncologist-  dr gorsuch/  dr Sondra Come   dx 09-01-2016-- Stage IB2--  Invasive cervix adenocarcinoma-- completed chemotherapy (11-09-2016 to 12-08-2016) concurrent w/ external beam radiation (completed 12-22-2016))  . Depression   . History of cancer chemotherapy 11-09-2016 to 12-08-2016  . History of radiation therapy 11-09-2016  to 12-22-2016   cervix was treated to 45 Gy, pelvis was treated to 9 Gy, Sclav left was treated to 56 Gy.  Marland Kitchen History of radiation therapy 01/04/17, 01/10/17, 01/17/17, 01/24/17, 01/26/17   cervix was treated to 27.5 Gy in 5 fractions  . Leukopenia due to antineoplastic chemotherapy [facility])     Past Surgical History:  Procedure Laterality Date  . CYST REMOVAL HAND  age 22  . IR FLUORO GUIDE PORT INSERTION RIGHT  10/29/2016  . IR REMOVAL TUN ACCESS W/ PORT W/O FL MOD SED  03/15/2017  . IR US GUIDE VASC ACCESS RIGHT  10/29/2016  . TANDEM RING INSERTION N/A 01/04/2017   Procedure: TANDEM RING INSERTION;  Surgeon: Gery Pray, MD;  Location: [facility];  Service: Urology;  Laterality: N/A;  . TANDEM RING INSERTION N/A 01/10/2017   Procedure: TANDEM RING INSERTION;  Surgeon: Gery Pray, MD;  Location: [facility];  Service: Urology;  Laterality: N/A;  . TANDEM RING INSERTION N/A 01/17/2017   Procedure: TANDEM RING INSERTION;  Surgeon: Gery Pray, MD;  Location: [facility];  Service: Urology;  Laterality: N/A;  . TANDEM RING INSERTION N/A 01/24/2017   Procedure: TANDEM RING INSERTION;  Surgeon: Gery Pray, MD;  Location: [facility];  Service: Urology;  Laterality: N/A;  . TANDEM RING INSERTION N/A 01/26/2017   Procedure: TANDEM RING INSERTION;  Surgeon: Gery Pray, MD;  Location: [facility];  Service: Urology;  Laterality: N/A;  . WISDOM TOOTH EXTRACTION     times 4    Family History  Problem Relation Age of Onset  . Diabetes Maternal Grandmother   . Breast cancer Paternal Grandmother      Social History   Socioeconomic History  . Marital status: Married    Spouse name: Not on file  . Number of children: 0  . Years of education: Not on file  . Highest education level: Not on file  Occupational History  . Not on file  Social Needs  . Financial resource strain: Not on file  . Food insecurity:    Worry: Not on file    Inability: Not on file  . Transportation needs:    Medical: Not on file    Non-medical: Not on file  Tobacco Use  . Smoking status: Current Every Day Smoker    Packs/day: 0.50    Years: 12.00    Pack years: 6.00    Types: Cigarettes  . Smokeless tobacco: Never Used  . Tobacco comment: 10 cig per day  Substance and Sexual Activity  . Alcohol use: No  . Drug use: No  . Sexual activity: Yes    Birth control/protection: None  Lifestyle  . Physical activity:    Days per week: Not on file    Minutes  per session: Not on file  . Stress: Not on file  Relationships  . Social connections:    Talks on phone: Not on file    Gets together: Not on file    Attends religious service: Not on file    Active member of club or organization: Not on file    Attends meetings of clubs or organizations: Not on file    Relationship status: Not on file  . Intimate partner violence:    Fear of current or ex partner: Not on file    Emotionally abused: Not on file    Physically abused: Not on file    Forced sexual activity: Not on file  Other Topics Concern  . Not on file  Social History Narrative  . Not on file    Past Medical History, Surgical history, Social history, and Family history were reviewed and updated as appropriate.   Please see review of systems for further details on the patient's review from today.   Review of Systems:  Review of Systems  Constitutional: Negative for activity change, appetite change, chills, diaphoresis and fever.  Respiratory: Negative for cough, chest tightness and shortness of breath.   Cardiovascular: Negative for  chest pain, palpitations and leg swelling.  Gastrointestinal: Positive for nausea. Negative for abdominal distention, abdominal pain, constipation, diarrhea and vomiting.  Genitourinary: Positive for decreased urine volume and pelvic pain.    Objective:   Physical Exam:  BP (!) 131/94 (BP Location: Right Arm, Patient Position: Sitting)   Pulse 83   Temp 97.6 F (36.4 C) (Oral)   Resp 18   Ht 5\' 2"  (1.575 m)   Wt 142 lb 14.4 oz (64.8 kg)   SpO2 100%   BMI 26.14 kg/m  ECOG: 0  Physical Exam  Constitutional: No distress.  HENT:  Head: Normocephalic and atraumatic.  Mouth/Throat: No oropharyngeal exudate.  Neck: Normal range of motion. Neck supple.  Cardiovascular: Normal rate, regular rhythm and normal heart sounds. Exam reveals no gallop and no friction rub.  No murmur heard. Pulmonary/Chest: Effort normal and breath sounds normal. No respiratory distress. She has no wheezes. She has no rales.  Abdominal: Soft. Bowel sounds are normal. She exhibits no distension and no mass. There is no tenderness. There is no rebound and no guarding.  Lymphadenopathy:    She has no cervical adenopathy.  Neurological: She is alert. Coordination normal.  Skin: Skin is warm and dry. No rash noted. She is not diaphoretic. No erythema.    Lab Review:     Component Value Date/Time   NA 137 02/17/2018 1556   NA 137 02/17/2017 1240   K 3.4 (L) 02/17/2018 1556   K 3.6 02/17/2017 1240   CL 101 02/17/2018 1556   CO2 26 02/17/2018 1556   CO2 24 02/17/2017 1240   GLUCOSE 92 02/17/2018 1556   GLUCOSE 121 02/17/2017 1240   BUN 12 02/17/2018 1556   BUN 7.5 02/17/2017 1240   CREATININE 0.98 02/17/2018 1556   CREATININE 0.9 02/17/2017 1240   CALCIUM 9.9 02/17/2018 1556   CALCIUM 9.6 02/17/2017 1240   PROT 7.6 02/17/2018 1556   PROT 7.0 02/17/2017 1240   ALBUMIN 4.2 02/17/2018 1556   ALBUMIN 3.6 02/17/2017 1240   AST 20 02/17/2018 1556   AST 22 02/17/2017 1240   ALT 24 02/17/2018 1556   ALT  52 02/17/2017 1240   ALKPHOS 63 02/17/2018 1556   ALKPHOS 65 02/17/2017 1240   BILITOT 0.5 02/17/2018 1556   BILITOT  0.36 02/17/2017 1240   GFRNONAA >60 02/17/2018 1556   GFRAA >60 02/17/2018 1556       Component Value Date/Time   WBC 5.1 02/17/2018 1556   RBC 4.55 02/17/2018 1556   HGB 13.1 02/17/2018 1556   HGB 11.7 02/17/2017 1240   HCT 40.2 02/17/2018 1556   HCT 35.0 02/17/2017 1240   PLT 302 02/17/2018 1556   PLT 246 02/17/2017 1240   MCV 88.4 02/17/2018 1556   MCV 95.1 02/17/2017 1240   MCH 28.8 02/17/2018 1556   MCHC 32.6 02/17/2018 1556   RDW 12.4 02/17/2018 1556   RDW 13.5 02/17/2017 1240   LYMPHSABS 1.2 02/17/2018 1556   LYMPHSABS 0.4 (L) 02/17/2017 1240   MONOABS 0.3 02/17/2018 1556   MONOABS 0.3 02/17/2017 1240   EOSABS 0.1 02/17/2018 1556   EOSABS 0.1 02/17/2017 1240   BASOSABS 0.0 02/17/2018 1556   BASOSABS 0.0 02/17/2017 1240   -------------------------------  Imaging from last 24 hours (if applicable):  Radiology interpretation: Ct Chest W Contrast  Result Date: 02/02/2018 CLINICAL DATA:  Cervical cancer with new hypermetabolic right upper lobe pulmonary nodule and hypermetabolic right paratracheal lymph node on PET imaging. EXAM: CT CHEST WITH CONTRAST TECHNIQUE: Multidetector CT imaging of the chest was performed during intravenous contrast administration. CONTRAST:  24mL OMNIPAQUE IOHEXOL 300 MG/ML  SOLN COMPARISON:  PET-CT 01/04/2018. FINDINGS: Cardiovascular: The heart size is normal. No substantial pericardial effusion. Mediastinum/Nodes: 8 mm paratracheal lymph node measured on the prior study is stable at 8 mm. Precarinal lymph node also measures about 8 mm in is unchanged. 10 mm short axis right hilar lymph node is better seen on today's study with intravenous contrast material. The esophagus has normal imaging features. There is no axillary lymphadenopathy. Lungs/Pleura: Mucus is noted in the left mainstem bronchus. 6 mm nodule posterior right upper  lobe is stable at 6 mm (5:36). 4 mm ground-glass nodule left apex is unchanged. Lungs otherwise unremarkable. Upper Abdomen: Unremarkable. Musculoskeletal: No worrisome lytic or sclerotic osseous abnormality. IMPRESSION: 1. Stable appearance of the 6 mm right upper lobe pulmonary nodule with no change in the right paratracheal and precarinal lymph nodes. Given hypermetabolism seen on the PET-CT 1 month ago, continued close attention will be required. Electronically Signed   By: Misty Stanley M.D.   On: 02/02/2018 12:20

## 2018-02-17 NOTE — ED Triage Notes (Signed)
Pt presents with continued lower abdominal pain.  Pt reports pain x 3 days, was seen at [facility] and here 2 days ago.  Pt reports getting prescribed abx; reports pain began to suprapubic area at first and is now in her groin.

## 2018-02-17 NOTE — Discharge Instructions (Addendum)
Your labs are the same as they were 2 days ago which is good thing. It shows that the infection is not getting worse. PID takes 2 weeks of antibiotics for it to be treated completely.  Continue to take the antibiotics as prescribed to you. I cannot prescribe you any new pain medications today since you have a current prescription for Tramadol. You may use Tylenol (650mg ) with Ibuprofen (800mg ) for additional relief. Try warm compresses or heating pad as well.  Follow-up with your oncologist next week as scheduled.  Thank you for allowing me to take care of you today!

## 2018-02-17 NOTE — Telephone Encounter (Signed)
PT scheduled per 8/23 sch message.  °

## 2018-02-17 NOTE — Patient Instructions (Signed)

## 2018-02-17 NOTE — ED Provider Notes (Signed)
[place] EMERGENCY DEPARTMENT Provider Note  CSN: [id number] Arrival date & time: 02/17/18  1310  History   Chief Complaint Chief Complaint  Patient presents with  . Abdominal Pain    HPI Joanne Griffin is a 32 y.o. female with a medical history of cervical cancer s/p chemo and radiation who presented to the ED for lower abdominal pain x5 days. She describes cramping abdominal pain that radiates to the groin bilaterally. Patient reports 2 prior ED visits this week for the same complaint. She describes the quality and severity of the pain the same as when it first began earlier this week. Last ED visit was 02/15/18 where she was diagnosed with PID and started on antibiotics. She reports being compliant with medications. Denies fever, chills, vaginal pain, vaginal discharge, vaginal bleeding, changes in bowel or urinary habits.  Past Medical History:  Diagnosis Date  . Anxiety   . Cervical cancer [facility]) oncologist-  dr gorsuch/  dr Sondra Come   dx 09-01-2016-- Stage IB2--  Invasive cervix adenocarcinoma-- completed chemotherapy (11-09-2016 to 12-08-2016) concurrent w/ external beam radiation (completed 12-22-2016))  . Depression   . History of cancer chemotherapy 11-09-2016 to 12-08-2016  . History of radiation therapy 11-09-2016  to 12-22-2016   cervix was treated to 45 Gy, pelvis was treated to 9 Gy, Sclav left was treated to 56 Gy.  Marland Kitchen History of radiation therapy 01/04/17, 01/10/17, 01/17/17, 01/24/17, 01/26/17   cervix was treated to 27.5 Gy in 5 fractions  . Leukopenia due to antineoplastic chemotherapy [facility])     Patient Active Problem List   Diagnosis Date Noted  . Lesion of right lung 01/05/2018  . Port catheter in place 11/02/2016  . Depression with anxiety 10/21/2016  . Cervical cancer ([place]) 10/20/2016  . Oligomenorrhea 09/02/2016    Past Surgical History:  Procedure Laterality Date  . CYST REMOVAL HAND  age 1  . IR FLUORO GUIDE PORT INSERTION RIGHT  10/29/2016    . IR REMOVAL TUN ACCESS W/ PORT W/O FL MOD SED  03/15/2017  . IR US GUIDE VASC ACCESS RIGHT  10/29/2016  . TANDEM RING INSERTION N/A 01/04/2017   Procedure: TANDEM RING INSERTION;  Surgeon: Gery Pray, MD;  Location: [facility];  Service: Urology;  Laterality: N/A;  . TANDEM RING INSERTION N/A 01/10/2017   Procedure: TANDEM RING INSERTION;  Surgeon: Gery Pray, MD;  Location: [facility];  Service: Urology;  Laterality: N/A;  . TANDEM RING INSERTION N/A 01/17/2017   Procedure: TANDEM RING INSERTION;  Surgeon: Gery Pray, MD;  Location[facility];  Service: Urology;  Laterality: N/A;  . TANDEM RING INSERTION N/A 01/24/2017   Procedure: TANDEM RING INSERTION;  Surgeon: Gery Pray, MD;  Location: [facility];  Service: Urology;  Laterality: N/A;  . TANDEM RING INSERTION N/A 01/26/2017   Procedure: TANDEM RING INSERTION;  Surgeon: Gery Pray, MD;  Location: [facility];  Service: Urology;  Laterality: N/A;  . WISDOM TOOTH EXTRACTION     times 4     OB History    Gravida  0   Para  0   Term  0   Preterm  0   AB  0   Living  0     SAB  0   TAB  0   Ectopic  0   Multiple  0   Live Births  0            Home Medications  Prior to Admission medications   Medication Sig Start Date End Date Taking? Authorizing Provider  doxycycline (VIBRAMYCIN) 100 MG capsule Take 1 capsule (100 mg total) by mouth 2 (two) times daily for 14 days. 02/15/18 03/01/18  Maczis, Barth Kirks, PA-C  metroNIDAZOLE (FLAGYL) 500 MG tablet Take 1 tablet (500 mg total) by mouth 2 (two) times daily for 14 days. 02/15/18 03/01/18  Maczis, Barth Kirks, PA-C  ondansetron (ZOFRAN-ODT) 4 MG disintegrating tablet Take 4 mg by mouth every 8 (eight) hours as needed for nausea/vomiting. 02/14/18 02/21/18  [provider]  traMADol (ULTRAM) 50 MG tablet Take 50 mg by mouth every 6 (six) hours as needed for pain. 02/14/18 02/19/18   [provider]    Family History Family History  Problem Relation Age of Onset  . Diabetes Maternal Grandmother   . Breast cancer Paternal Grandmother     Social History Social History   Tobacco Use  . Smoking status: Current Every Day Smoker    Packs/day: 0.50    Years: 12.00    Pack years: 6.00    Types: Cigarettes  . Smokeless tobacco: Never Used  . Tobacco comment: 10 cig per day  Substance Use Topics  . Alcohol use: No  . Drug use: No     Allergies   Amoxicillin and Penicillins   Review of Systems Review of Systems  Constitutional: Negative for chills, fatigue and fever.  Gastrointestinal: Positive for abdominal pain and nausea. Negative for constipation, diarrhea and vomiting.  Genitourinary: Negative for dysuria, hematuria, vaginal bleeding, vaginal discharge and vaginal pain.  Musculoskeletal: Negative.   Skin: Negative.      Physical Exam Updated Vital Signs BP 103/88   Pulse 63   Temp 97.6 F (36.4 C) (Oral)   Resp 16   Ht 5\' 2"  (1.575 m)   Wt 64.4 kg   SpO2 96%   BMI 25.97 kg/m   Physical Exam  Constitutional: Vital signs are normal. She appears well-developed and well-nourished.  Non-toxic appearance. She does not have a sickly appearance.  Cardiovascular: Normal rate, regular rhythm and normal heart sounds.  Pulmonary/Chest: Effort normal and breath sounds normal.  Abdominal: Soft. Normal appearance and bowel sounds are normal. There is no tenderness.  Genitourinary: Pelvic exam was performed with patient prone. Cervix exhibits discharge and friability. There is tenderness in the vagina. Vaginal discharge found.  Genitourinary Comments: Pain with insertion of speculum and bimanual exam that causes patient to move off bed. Erythematous cervix with discharge from os and vaginal canal visualized.  Skin: Skin is warm. She is not diaphoretic. No pallor.  Nursing note and vitals reviewed.  ED Treatments / Results  Labs (all labs  ordered are listed, but only abnormal results are displayed) Labs Reviewed  COMPREHENSIVE METABOLIC PANEL - Abnormal; Notable for the following components:      Result Value   Potassium 3.4 (*)    All other components within normal limits  URINALYSIS, ROUTINE W REFLEX MICROSCOPIC - Abnormal; Notable for the following components:   Hgb urine dipstick MODERATE (*)    Ketones, ur 5 (*)    Leukocytes, UA TRACE (*)    Bacteria, UA RARE (*)    All other components within normal limits  CBC WITH DIFFERENTIAL/PLATELET  LIPASE, BLOOD    EKG None  Radiology No results found.  Procedures Procedures (including critical care time)  Medications Ordered in ED Medications  morphine 2 MG/ML injection 2 mg (2 mg Intravenous Given 02/17/18 1706)  ondansetron (ZOFRAN)  injection 4 mg (4 mg Intravenous Given 02/17/18 1706)     Initial Impression / Assessment and Plan / ED Course  Triage vital signs and the nursing notes have been reviewed.  Pertinent labs & imaging results that were available during care of the patient were reviewed and considered in medical decision making (see chart for details).  Patient presents to the ED for the 3rd time this week for lower abdominal pain. She was diagnosed and treated for PID 2 days ago on 02/15/18 and reports compliance with antibiotic regimen. On physical exam, patient has extreme CMT with discharge seen on pelvic exam. Based on record review, history and clinical presentation, there has been no acute worsening of patient's symptoms since antibiotic treatment began which is reassuring. Typical antibiotic course for PID is 14 days and patient has not had enough doses for resolution to occur yet. Patient encouraged to continue antibiotic regimen. She states she has follow-up with her gyn oncologist early next week and will be sure to discuss this with her at that time.  Clinical Course as of Feb 18 2208  Fri Feb 17, 2018  1730 UA and labs consistent with  results from 02/15/18.   [GM]    Clinical Course User Index [GM] Jacqulene Huntley, Jonelle Sports, PA-C    Final Clinical Impressions(s) / ED Diagnoses  1. Pelvic Inflammatory Disease. Education provided on previously prescribed antibiotic regimen and advised to continue treatment. Has scheduled follow-up with her oncologist on Monday 02/20/18 who regular does pelvic exams for her.  Dispo: Home. After thorough clinical evaluation, this patient is determined to be medically stable and can be safely discharged with the previously mentioned treatment and/or outpatient follow-up/referral(s). At this time, there are no other apparent medical conditions that require further screening, evaluation or treatment.   Final diagnoses:  Pelvic inflammatory disease    ED Discharge Orders    None        Junita Push 02/17/18 2209    Fredia Sorrow, MD 02/26/18 [id number]

## 2018-02-21 ENCOUNTER — Other Ambulatory Visit (HOSPITAL_COMMUNITY)
Admission: RE | Admit: 2018-02-21 | Discharge: 2018-02-21 | Disposition: A | Discharge status: Home / Self Care | Payer: Self-pay | Source: Ambulatory Visit | Attending: Gynecology | Admitting: Gynecology

## 2018-02-21 ENCOUNTER — Telehealth: Payer: Self-pay | Admitting: *Deleted

## 2018-02-21 ENCOUNTER — Encounter: Payer: Self-pay | Admitting: Gynecology

## 2018-02-21 ENCOUNTER — Inpatient Hospital Stay (HOSPITAL_BASED_OUTPATIENT_CLINIC_OR_DEPARTMENT_OTHER): Payer: Self-pay | Admitting: Gynecology

## 2018-02-21 VITALS — BP 122/84 | HR 75 | Temp 97.7°F | Resp 20 | Ht 62.0 in | Wt 142.0 lb

## 2018-02-21 DIAGNOSIS — Z923 Personal history of irradiation: Secondary | ICD-10-CM

## 2018-02-21 DIAGNOSIS — C530 Malignant neoplasm of endocervix: Secondary | ICD-10-CM | POA: Insufficient documentation

## 2018-02-21 DIAGNOSIS — Z8541 Personal history of malignant neoplasm of cervix uteri: Secondary | ICD-10-CM

## 2018-02-21 DIAGNOSIS — Z9221 Personal history of antineoplastic chemotherapy: Secondary | ICD-10-CM

## 2018-02-21 DIAGNOSIS — Z08 Encounter for follow-up examination after completed treatment for malignant neoplasm: Secondary | ICD-10-CM

## 2018-02-21 DIAGNOSIS — F17210 Nicotine dependence, cigarettes, uncomplicated: Secondary | ICD-10-CM

## 2018-02-21 NOTE — Progress Notes (Signed)
Consult Note: Gyn-Onc   [place] 32 y.o. female  Chief Complaint  Patient presents with  . 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 Patient's clinically free of disease.  Recent episodes of pelvic pain treated as if the patient had PID.  She feels much better today (all pain is resolved)  Plan:  Pap smears obtained. The patient turned to see me in 6 months to see Dr. Sondra Come and 12 months to see me..  Interval history: Patient returns today for scheduled follow-up.  Over the past week she has been seen in the emergency room twice for pelvic pain.  On the second visit she was treated with antibiotics for presumed pelvic inflammatory disease.  (Gonorrhea and Chlamydia cultures were negative) the patient reports that she now has no pain.  She denies any vaginal discharge or bleeding.   Of note, the patient has a pet scan positive lung nodule and paratracheal lymph node.  CT scan a month later showed stability.  The patient is being followed by Dr. Alvy Bimler for this issue..  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    HPI: 32 year-old white single female initially seen in consultation at the request of Dr. Aletha Halim and Dr.Carolyn Harraway-Smith regarding management of a newly diagnosed adenocarcinoma of the cervix. The patient reports she is not had Pap smears in the "many years". She presented initially with flank pain to the emergency room on 07/29/2016. She was treated for a UTI and had resolution of her flank pain although she continued to have suprapubic pain. She has had irregular bleeding and a copious discharge for several months. Ultimately she had a Pap smear showing atypical glandular cells and a cervical biopsy on 09/01/2016 revealing invasive adenocarcinoma of the cervix. The patient denies any weight loss or other constitutional symptoms.  CT scan revealed  pelvic and super clavicular adenopathy. Patient received radiation therapy with concurrent cisplatin completed in 01/26/2017.  Review of Systems:10 point review of systems is negative except as noted in interval history.   Vitals: Blood pressure 122/84, pulse 75, temperature 97.7 F (36.5 C), temperature source Oral, resp. rate 20, height 5\' 2"  (1.575 m), weight 142 lb (64.4 kg), SpO2 100 %.  Physical Exam: General : The patient is a healthy woman in no acute distress.  HEENT: normocephalic, extraoccular movements normal; neck is supple without thyromegally . There are radiation skin changes on the left supraclavicular region Lynphnodes: I am unable to palpate the super clavicular lymph node. Abdomen: Soft, non-tender, no ascites, no organomegally, no masses, no hernias  Pelvic:  EGBUS: Normal female  Vagina: Normal, no lesions  Urethra and Bladder: Normal, non-tender  Cervix flush with the vaginal vault. No lesions are noted. Uterus: Difficult to fully outline Bi-manual examination: Non-tender; no adenxal masses or nodularity,    Lower extremities: No edema or varicosities. Normal range of motion      Allergies  Allergen Reactions  . Amoxicillin Hives    Has patient had a PCN reaction causing immediate rash, facial/tongue/throat swelling, SOB or lightheadedness with hypotension: Yes Has patient had a PCN reaction causing severe rash involving mucus membranes or skin necrosis: Unknown Has patient had a PCN reaction that required hospitalization: Unknown Has patient had a PCN reaction occurring within the last 10 years: No If all of the above answers are "NO", then may proceed with Cephalosporin use.   Marland Kitchen  Penicillins Other (See Comments)    UNKNOWN CHILDHOOD REACTION    Past Medical History:  Diagnosis Date  . Anxiety   . Cervical cancer [facility]) oncologist-  dr gorsuch/  dr Sondra Come   dx 09-01-2016-- Stage IB2--  Invasive cervix adenocarcinoma-- completed chemotherapy (11-09-2016  to 12-08-2016) concurrent w/ external beam radiation (completed 12-22-2016))  . Depression   . History of cancer chemotherapy 11-09-2016 to 12-08-2016  . History of radiation therapy 11-09-2016  to 12-22-2016   cervix was treated to 45 Gy, pelvis was treated to 9 Gy, Sclav left was treated to 56 Gy.  Marland Kitchen History of radiation therapy 01/04/17, 01/10/17, 01/17/17, 01/24/17, 01/26/17   cervix was treated to 27.5 Gy in 5 fractions  . Leukopenia due to antineoplastic chemotherapy [facility])     Past Surgical History:  Procedure Laterality Date  . CYST REMOVAL HAND  age 82  . IR FLUORO GUIDE PORT INSERTION RIGHT  10/29/2016  . IR REMOVAL TUN ACCESS W/ PORT W/O FL MOD SED  03/15/2017  . IR US GUIDE VASC ACCESS RIGHT  10/29/2016  . TANDEM RING INSERTION N/A 01/04/2017   Procedure: TANDEM RING INSERTION;  Surgeon: Gery Pray, MD;  Location: [facility];  Service: Urology;  Laterality: N/A;  . TANDEM RING INSERTION N/A 01/10/2017   Procedure: TANDEM RING INSERTION;  Surgeon: Gery Pray, MD;  Location: [facility];  Service: Urology;  Laterality: N/A;  . TANDEM RING INSERTION N/A 01/17/2017   Procedure: TANDEM RING INSERTION;  Surgeon: Gery Pray, MD;  Location: [facility];  Service: Urology;  Laterality: N/A;  . TANDEM RING INSERTION N/A 01/24/2017   Procedure: TANDEM RING INSERTION;  Surgeon: Gery Pray, MD;  Location: [facility];  Service: Urology;  Laterality: N/A;  . TANDEM RING INSERTION N/A 01/26/2017   Procedure: TANDEM RING INSERTION;  Surgeon: Gery Pray, MD;  Location: [facility];  Service: Urology;  Laterality: N/A;  . WISDOM TOOTH EXTRACTION     times 4    Current Outpatient Medications  Medication Sig Dispense Refill  . doxycycline (VIBRAMYCIN) 100 MG capsule Take 1 capsule (100 mg total) by mouth 2 (two) times daily for 14 days. 28 capsule 0  . metroNIDAZOLE (FLAGYL) 500 MG tablet Take 1 tablet (500 mg total) by  mouth 2 (two) times daily for 14 days. 28 tablet 0   No current facility-administered medications for this visit.     Social History   Socioeconomic History  . Marital status: Married    Spouse name: Not on file  . Number of children: 0  . Years of education: Not on file  . Highest education level: Not on file  Occupational History  . Not on file  Social Needs  . Financial resource strain: Not on file  . Food insecurity:    Worry: Not on file    Inability: Not on file  . Transportation needs:    Medical: Not on file    Non-medical: Not on file  Tobacco Use  . Smoking status: Current Every Day Smoker    Packs/day: 0.50    Years: 12.00    Pack years: 6.00    Types: Cigarettes  . Smokeless tobacco: Never Used  . Tobacco comment: 10 cig per day  Substance and Sexual Activity  . Alcohol use: No  . Drug use: No  . Sexual activity: Yes    Birth control/protection: None  Lifestyle  . Physical activity:    Days per  week: Not on file    Minutes per session: Not on file  . Stress: Not on file  Relationships  . Social connections:    Talks on phone: Not on file    Gets together: Not on file    Attends religious service: Not on file    Active member of club or organization: Not on file    Attends meetings of clubs or organizations: Not on file    Relationship status: Not on file  . Intimate partner violence:    Fear of current or ex partner: Not on file    Emotionally abused: Not on file    Physically abused: Not on file    Forced sexual activity: Not on file  Other Topics Concern  . Not on file  Social History Narrative  . Not on file    Family History  Problem Relation Age of Onset  . Diabetes Maternal Grandmother   . Breast cancer Paternal Grandmother       [name], MD 02/21/2018, 9:45 AM

## 2018-02-21 NOTE — Telephone Encounter (Signed)
CALLED PATIENT TO INFORM THAT FU APPT. WITH DR. KINARD HAS BEEN MOVED FROM 02-23-18 TO 08-24-18 @ 4 PM, APPT. MOVED PER GYN ONC, SPOKE WITH PATIENT AND SHE IS AWARE OF THIS APPT. AND IS GOOD WITH IT.

## 2018-02-21 NOTE — Patient Instructions (Signed)
We will contact you with the results of your pap smear from today.  Plan to follow up with Dr. Sondra Come in six months and Dr. Fermin Schwab in one year or sooner if needed.  Continue taking the antibiotics for the full course.

## 2018-02-23 ENCOUNTER — Ambulatory Visit: Admission: RE | Admit: 2018-02-23 | Payer: Self-pay | Source: Ambulatory Visit | Admitting: Radiation Oncology

## 2018-02-23 ENCOUNTER — Telehealth: Payer: Self-pay

## 2018-02-23 LAB — CYTOLOGY - PAP: DIAGNOSIS: NEGATIVE

## 2018-02-23 NOTE — Telephone Encounter (Signed)
Outgoing call to patient regarding recent pap results as "normal pap" per Joylene John NP.  Pt voiced understanding and no other needs per pt at this time.

## 2018-03-23 ENCOUNTER — Encounter: Payer: Self-pay | Admitting: Family Medicine

## 2018-04-03 ENCOUNTER — Telehealth: Payer: Self-pay

## 2018-04-03 NOTE — Telephone Encounter (Signed)
Called and given below message. She verbalized understanding. 

## 2018-04-03 NOTE — Telephone Encounter (Signed)
Grandmother, Nell called and left a message to call.  Called back. Georgeanne is out of town working in [place] until 11/4.  Canceled 10/23 lab appt and 10/24 appt with Dr. Alvy Bimler per request. Michelene will call and schedule scan when she is back in town.  She will come back earlier if Dr. Alvy Bimler thinks that the scan and appts are urgent and need to be before 11/4.

## 2018-04-03 NOTE — Telephone Encounter (Signed)
Called both phones and no answer. Left a voice mail. And mailed a calender with a letter enclosed. Per 10/7 phone message return

## 2018-04-03 NOTE — Telephone Encounter (Signed)
The decision is up to her If she feels "bad" she should do the scan sooner

## 2018-04-05 ENCOUNTER — Other Ambulatory Visit: Payer: Self-pay

## 2018-04-06 ENCOUNTER — Ambulatory Visit: Payer: Self-pay | Admitting: Hematology and Oncology

## 2018-04-12 ENCOUNTER — Telehealth: Payer: Self-pay | Admitting: *Deleted

## 2018-04-12 NOTE — Telephone Encounter (Signed)
On 04-12-18 call pt to r/s appt to 08-17-18 , cancel appt for 08-24-18 Dr. Sondra Come going to be out of the office

## 2018-04-19 ENCOUNTER — Other Ambulatory Visit: Payer: Self-pay

## 2018-04-20 ENCOUNTER — Ambulatory Visit: Payer: Self-pay | Admitting: Hematology and Oncology

## 2018-05-03 ENCOUNTER — Inpatient Hospital Stay: Payer: Self-pay

## 2018-05-03 ENCOUNTER — Inpatient Hospital Stay: Payer: Self-pay | Attending: Hematology and Oncology

## 2018-05-03 ENCOUNTER — Ambulatory Visit (HOSPITAL_COMMUNITY)
Admission: RE | Admit: 2018-05-03 | Discharge: 2018-05-03 | Disposition: A | Discharge status: Home / Self Care | Payer: Self-pay | Source: Ambulatory Visit | Attending: Hematology and Oncology | Admitting: Hematology and Oncology

## 2018-05-03 DIAGNOSIS — R911 Solitary pulmonary nodule: Secondary | ICD-10-CM

## 2018-05-03 DIAGNOSIS — R918 Other nonspecific abnormal finding of lung field: Secondary | ICD-10-CM

## 2018-05-03 DIAGNOSIS — Z8541 Personal history of malignant neoplasm of cervix uteri: Secondary | ICD-10-CM | POA: Insufficient documentation

## 2018-05-03 DIAGNOSIS — C530 Malignant neoplasm of endocervix: Secondary | ICD-10-CM

## 2018-05-03 LAB — CBC WITH DIFFERENTIAL/PLATELET
Abs Immature Granulocytes: 0 10*3/uL (ref 0.00–0.07)
BASOS PCT: 0 %
Basophils Absolute: 0 10*3/uL (ref 0.0–0.1)
EOS ABS: 0.1 10*3/uL (ref 0.0–0.5)
Eosinophils Relative: 2 %
HCT: 37.5 % (ref 36.0–46.0)
Hemoglobin: 12.3 g/dL (ref 12.0–15.0)
IMMATURE GRANULOCYTES: 0 %
Lymphocytes Relative: 28 %
Lymphs Abs: 1.4 10*3/uL (ref 0.7–4.0)
MCH: 29.4 pg (ref 26.0–34.0)
MCHC: 32.8 g/dL (ref 30.0–36.0)
MCV: 89.5 fL (ref 80.0–100.0)
MONOS PCT: 8 %
Monocytes Absolute: 0.4 10*3/uL (ref 0.1–1.0)
NEUTROS PCT: 62 %
Neutro Abs: 3.2 10*3/uL (ref 1.7–7.7)
PLATELETS: 315 10*3/uL (ref 150–400)
RBC: 4.19 MIL/uL (ref 3.87–5.11)
RDW: 12.8 % (ref 11.5–15.5)
WBC: 5.2 10*3/uL (ref 4.0–10.5)
nRBC: 0 % (ref 0.0–0.2)

## 2018-05-03 LAB — COMPREHENSIVE METABOLIC PANEL
ALT: 24 U/L (ref 0–44)
AST: 16 U/L (ref 15–41)
Albumin: 3.7 g/dL (ref 3.5–5.0)
Alkaline Phosphatase: 77 U/L (ref 38–126)
Anion gap: 7 (ref 5–15)
BUN: 12 mg/dL (ref 6–20)
CHLORIDE: 106 mmol/L (ref 98–111)
CO2: 28 mmol/L (ref 22–32)
Calcium: 9.4 mg/dL (ref 8.9–10.3)
Creatinine, Ser: 0.82 mg/dL (ref 0.44–1.00)
Glucose, Bld: 95 mg/dL (ref 70–99)
POTASSIUM: 4 mmol/L (ref 3.5–5.1)
Sodium: 141 mmol/L (ref 135–145)
Total Bilirubin: <0.2 mg/dL — ABNORMAL LOW (ref 0.3–1.2)
Total Protein: 7 g/dL (ref 6.5–8.1)

## 2018-05-03 MED ORDER — SODIUM CHLORIDE (PF) 0.9 % IJ SOLN
INTRAMUSCULAR | Status: AC
  Filled 2018-05-03: qty 50

## 2018-05-03 MED ORDER — IOHEXOL 300 MG/ML  SOLN
75.0000 mL | Freq: Once | INTRAMUSCULAR | Status: AC | PRN
  Administered 2018-05-03: 75 mL via INTRAVENOUS

## 2018-05-26 ENCOUNTER — Other Ambulatory Visit: Payer: Self-pay | Admitting: Hematology and Oncology

## 2018-05-26 ENCOUNTER — Telehealth: Payer: Self-pay | Admitting: *Deleted

## 2018-05-26 DIAGNOSIS — R911 Solitary pulmonary nodule: Secondary | ICD-10-CM

## 2018-05-26 DIAGNOSIS — C530 Malignant neoplasm of endocervix: Secondary | ICD-10-CM

## 2018-05-26 NOTE — Telephone Encounter (Signed)
LM with note below. To call if she has questions 

## 2018-05-26 NOTE — Telephone Encounter (Signed)
-----   Message from Heath Lark, MD sent at 05/26/2018  8:33 AM EST ----- Regarding: recent CT Her recent CT is stable. I did not see anyone called her I plan to schedule labs/Ct in 3 months plus MD follow-up

## 2018-07-27 ENCOUNTER — Telehealth: Payer: Self-pay

## 2018-07-27 NOTE — Telephone Encounter (Signed)
-----   Message from [facility] sent at 07/27/2018  8:54 AM EST ----- Regarding: RE: Ct scan next week not scheduled yet Patient is self-pay. No Josem Kaufmann is required. Ready for scheduling.   Thanks,  Velna Hatchet ----- Message ----- From: Heath Lark, MD Sent: 07/27/2018   8:16 AM EST To: Flo Shanks, RN, Elliot Gault Subject: Ct scan next week not scheduled yet

## 2018-07-27 NOTE — Telephone Encounter (Signed)
Called and given radiology scheduling #. She will call and schedule. 

## 2018-08-03 ENCOUNTER — Inpatient Hospital Stay: Payer: Self-pay | Attending: Hematology and Oncology

## 2018-08-03 ENCOUNTER — Encounter (HOSPITAL_COMMUNITY): Payer: Self-pay | Admitting: Radiology

## 2018-08-03 ENCOUNTER — Ambulatory Visit (HOSPITAL_COMMUNITY)
Admission: RE | Admit: 2018-08-03 | Discharge: 2018-08-03 | Disposition: A | Discharge status: Home / Self Care | Payer: Self-pay | Source: Ambulatory Visit | Attending: Hematology and Oncology | Admitting: Hematology and Oncology

## 2018-08-03 DIAGNOSIS — Z87891 Personal history of nicotine dependence: Secondary | ICD-10-CM | POA: Insufficient documentation

## 2018-08-03 DIAGNOSIS — Z9221 Personal history of antineoplastic chemotherapy: Secondary | ICD-10-CM | POA: Insufficient documentation

## 2018-08-03 DIAGNOSIS — Z23 Encounter for immunization: Secondary | ICD-10-CM | POA: Insufficient documentation

## 2018-08-03 DIAGNOSIS — Z923 Personal history of irradiation: Secondary | ICD-10-CM | POA: Insufficient documentation

## 2018-08-03 DIAGNOSIS — C530 Malignant neoplasm of endocervix: Secondary | ICD-10-CM

## 2018-08-03 DIAGNOSIS — R911 Solitary pulmonary nodule: Secondary | ICD-10-CM | POA: Insufficient documentation

## 2018-08-03 DIAGNOSIS — Z8541 Personal history of malignant neoplasm of cervix uteri: Secondary | ICD-10-CM | POA: Insufficient documentation

## 2018-08-03 LAB — CBC WITH DIFFERENTIAL/PLATELET
Abs Immature Granulocytes: 0.01 10*3/uL (ref 0.00–0.07)
BASOS PCT: 0 %
Basophils Absolute: 0 10*3/uL (ref 0.0–0.1)
EOS PCT: 1 %
Eosinophils Absolute: 0.1 10*3/uL (ref 0.0–0.5)
HCT: 37.3 % (ref 36.0–46.0)
Hemoglobin: 12.3 g/dL (ref 12.0–15.0)
Immature Granulocytes: 0 %
Lymphocytes Relative: 29 %
Lymphs Abs: 1.3 10*3/uL (ref 0.7–4.0)
MCH: 29 pg (ref 26.0–34.0)
MCHC: 33 g/dL (ref 30.0–36.0)
MCV: 88 fL (ref 80.0–100.0)
MONO ABS: 0.3 10*3/uL (ref 0.1–1.0)
Monocytes Relative: 8 %
Neutro Abs: 2.7 10*3/uL (ref 1.7–7.7)
Neutrophils Relative %: 62 %
PLATELETS: 294 10*3/uL (ref 150–400)
RBC: 4.24 MIL/uL (ref 3.87–5.11)
RDW: 13.2 % (ref 11.5–15.5)
WBC: 4.5 10*3/uL (ref 4.0–10.5)
nRBC: 0 % (ref 0.0–0.2)

## 2018-08-03 LAB — COMPREHENSIVE METABOLIC PANEL
ALK PHOS: 93 U/L (ref 38–126)
ALT: 37 U/L (ref 0–44)
ANION GAP: 8 (ref 5–15)
AST: 23 U/L (ref 15–41)
Albumin: 3.9 g/dL (ref 3.5–5.0)
BUN: 12 mg/dL (ref 6–20)
CHLORIDE: 106 mmol/L (ref 98–111)
CO2: 26 mmol/L (ref 22–32)
CREATININE: 0.84 mg/dL (ref 0.44–1.00)
Calcium: 9.5 mg/dL (ref 8.9–10.3)
GFR calc non Af Amer: >60 mL/min (ref >60)
Glucose, Bld: 103 mg/dL — ABNORMAL HIGH (ref 70–99)
Potassium: 3.9 mmol/L (ref 3.5–5.1)
SODIUM: 140 mmol/L (ref 135–145)
TOTAL PROTEIN: 7 g/dL (ref 6.5–8.1)
Total Bilirubin: 0.7 mg/dL (ref 0.3–1.2)

## 2018-08-03 MED ORDER — IOHEXOL 300 MG/ML  SOLN
75.0000 mL | Freq: Once | INTRAMUSCULAR | Status: AC | PRN
  Administered 2018-08-03: 75 mL via INTRAVENOUS

## 2018-08-03 MED ORDER — SODIUM CHLORIDE (PF) 0.9 % IJ SOLN
INTRAMUSCULAR | Status: AC
  Filled 2018-08-03: qty 50

## 2018-08-04 ENCOUNTER — Encounter: Payer: Self-pay | Admitting: Hematology and Oncology

## 2018-08-04 ENCOUNTER — Inpatient Hospital Stay (HOSPITAL_BASED_OUTPATIENT_CLINIC_OR_DEPARTMENT_OTHER): Payer: Self-pay | Admitting: Hematology and Oncology

## 2018-08-04 VITALS — BP 119/65 | HR 80 | Temp 97.5°F | Resp 18 | Ht 62.0 in | Wt 166.0 lb

## 2018-08-04 DIAGNOSIS — Z9221 Personal history of antineoplastic chemotherapy: Secondary | ICD-10-CM

## 2018-08-04 DIAGNOSIS — Z923 Personal history of irradiation: Secondary | ICD-10-CM

## 2018-08-04 DIAGNOSIS — R911 Solitary pulmonary nodule: Secondary | ICD-10-CM

## 2018-08-04 DIAGNOSIS — Z23 Encounter for immunization: Secondary | ICD-10-CM

## 2018-08-04 DIAGNOSIS — C530 Malignant neoplasm of endocervix: Secondary | ICD-10-CM

## 2018-08-04 DIAGNOSIS — Z8541 Personal history of malignant neoplasm of cervix uteri: Secondary | ICD-10-CM

## 2018-08-04 MED ORDER — INFLUENZA VAC SPLIT QUAD 0.5 ML IM SUSY
PREFILLED_SYRINGE | INTRAMUSCULAR | Status: AC
  Filled 2018-08-04: qty 0.5

## 2018-08-04 MED ORDER — INFLUENZA VAC SPLIT QUAD 0.5 ML IM SUSY
0.5000 mL | PREFILLED_SYRINGE | Freq: Once | INTRAMUSCULAR | Status: AC
  Administered 2018-08-04: 0.5 mL via INTRAMUSCULAR

## 2018-08-04 NOTE — Assessment & Plan Note (Signed)
The patient is not symptomatic She has appointment for pelvic exam and follow-up with radiation oncologist We will make sure she get follow-up appointment with GYN oncologist in May

## 2018-08-04 NOTE — Assessment & Plan Note (Addendum)
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

## 2018-08-04 NOTE — Progress Notes (Signed)
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 Ambulatory referral to Pulmonology    Referral Priority:   Routine    Referral Type:   Consultation    Referral Reason:   Specialty Services Required    Requested Specialty:   Pulmonary Disease    Number of Visits Requested:   1    INTERVAL HISTORY: Please see below for problem oriented charting. She returns for further follow-up Since the last time I saw her, she denies cough, chest pain or shortness of breath She has been smoking cigars/weed intermittently.  Denies cigarette smoking.  No alcohol intake Denies pelvic pain no abnormal vaginal bleeding She has not received influenza vaccination recently  SUMMARY OF ONCOLOGIC HISTORY:   Adenocarcinoma (GYN origin) (Resolved)   09/01/2016 Initial Diagnosis    Adenocarcinoma (GYN origin)     Cervical cancer ([place])   08/26/2016 Pathology Results    Adequacy Reason Satisfactory for evaluation,  endocervical/transformation zone component PRESENT. Diagnosis ATYPICAL GLANDULAR CELLS. SEE COMMENT. COMMENT: THERE ARE GROUPS OF ATYPICAL GLANDULAR CELLS WHICH APPEAR TO BE OF ENDOCERVICAL ORIGIN. TISSUE STUDIES, SUCH AS A CURETTAGE, MAY HELP BETTER EVALUATE THE EXTENT AND SEVERITY OF THESE ATYPICAL CELLS.    09/01/2016 Initial Diagnosis    The patient reports she is not had Pap smears in the "many years". She presented initially with flank pain to the emergency room on 07/29/2016. She was treated for a UTI and had resolution of her flank pain although she continued to have suprapubic pain. She has had irregular bleeding and a copious discharge for several months. 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 Microscopic Comment Immunohistochemistry is attempted to determine endocervical vs. endometrial origin. CEA is focally positive, p16 is diffusely strongly positive, ER is weakly positive, and vimentin is positive. This profile is not specific as to origin and clinical correlation is recommended. Dr. Avis Epley has reviewed the case. The case was called to Dr. Ilda Basset on 09/06/2016    10/05/2016 Imaging    Ct abdomen: 4.7 cm heterogeneous enhancing mass in the cervix and lower uterine segment, consistent with known cervical carcinoma. Mild retroperitoneal lymphadenopathy in the inferior aortocaval space and right common iliac chain, suspicious for metastatic disease.    10/29/2016 Procedure    Ultrasound and fluoroscopically guided right internal jugular single lumen power port catheter insertion. Tip in the SVC/RA junction. Catheter ready  for use    11/09/2016 - 01/26/2017 Radiation Therapy    Radiation treatment dates:   11/09/2016 - 12/23/2016 and HDR on 01/04/2017, 01/10/2017, 01/17/2017, 01/24/2017,  01/26/2017  Site/dose:    1) The cervix was treated to 45 Gy in 25 fractions,   2) The pelvis was treated to 9 Gy in 5 fractions as a boost to the suspicious pelvic  nodes. (54 Gy) 3) Sclav-LT was initially prescribed to 60 Gy in 30 fractions, but the patient discontinued at fraction 28 due to severe skin reactions. (56 Gy) 4) The cervix was also treated to 27.5 Gy in 5 fractions of 5.5 Gy.  Beams/energy:   1) Cervix: 3D // Photon 2) pelvic Boost: IMRT // Photon 3) Sclav-Lt: 3D // 6X, 10X Photon 4) HDR Ir-192 // Brachytherapy    11/09/2016 - 12/08/2016 Chemotherapy    She receives weekly cisplatin    03/15/2017 Procedure    Removal of implanted Port-A-Cath utilizing sharp and blunt dissection. The procedure was uncomplicated.    01/04/2018 PET scan    1. New hypermetabolic RIGHT upper lobe pulmonary nodule coupled with new hypermetabolic RIGHT paratracheal lymph node. Differential includes small pulmonary infection with reactive node versus pulmonary metastasis and mediastinal nodal metastasis. As pulmonary lesion was not present 3 months prior infection is a distinct possibility however the intensity of the paratracheal node favors malignancy. Consider short-term follow-up CT with contrast (1 month). If lesion persists or enlarges malignancy is likely. 2. No evidence of abnormal metabolic the uterine cervix. No pelvic adenopathy. 3. No evidence of malignancy in the occipital region.    02/02/2018 Imaging    Stable appearance of the 6 mm right upper lobe pulmonary nodule with no change in the right paratracheal and precarinal lymph nodes. Given hypermetabolism seen on the PET-CT 1 month ago, continued close attention will be required.    05/03/2018 Imaging    Right upper lobe nodule and mediastinal/right hilar lymph nodes are stable from 02/02/2018. Right upper lobe nodule is new from 09/27/2017. Findings remain worrisome for malignancy. Close attention on follow-up exams is warranted.     08/03/2018 Imaging    IMPRESSION: 1. Stable exam. No change in solid nodule within the right upper lobe compared with 02/02/18. Continued close interval follow-up is advised. 2. There are 2 tiny thin walled cystic nodules within the left apex and right lower lobe. These are nonspecific and may be postinflammatory in etiology. Pulmonary Langerhans cell histiocytosis may present with thin walled cystic lesions and can be seen almost exclusively in young adults with a history of current or previous smoking.     REVIEW OF SYSTEMS:   Constitutional: Denies fevers, chills or abnormal weight loss Eyes: Denies blurriness of vision Ears, nose, mouth, throat, and face: Denies mucositis or sore throat Respiratory: Denies cough, dyspnea or wheezes Cardiovascular: Denies palpitation, chest discomfort or lower extremity swelling Gastrointestinal:  Denies nausea, heartburn or change in bowel habits Skin: Denies abnormal skin rashes Lymphatics: Denies new lymphadenopathy or easy bruising Neurological:Denies numbness, tingling or new weaknesses Behavioral/Psych: Mood is stable, no new changes  All other systems were reviewed with the patient and are negative.  I have reviewed the past medical history, past surgical history, social history and family history with the patient and they are unchanged from previous note.  ALLERGIES:  is allergic to amoxicillin and penicillins.  MEDICATIONS:  No current outpatient medications on file.   Current Facility-Administered Medications  Medication Dose Route Frequency Provider Last Rate Last Dose  .  Influenza vac split quadrivalent PF (FLUARIX) injection 0.5 mL  0.5 mL Intramuscular Once Alvy Bimler, Israella Hubert, MD        PHYSICAL EXAMINATION: ECOG PERFORMANCE STATUS: 0 - Asymptomatic  Vitals:   08/04/18 0947  BP: 119/65  Pulse: 80  Resp: 18  Temp: (!) 97.5 F (36.4 C)  SpO2: 98%   Filed Weights   08/04/18 0947  Weight: 166 lb (75.3 kg)    GENERAL:alert, no  distress and comfortable SKIN: skin color, texture, turgor are normal, no rashes or significant lesions EYES: normal, Conjunctiva are pink and non-injected, sclera clear OROPHARYNX:no exudate, no erythema and lips, buccal mucosa, and tongue normal  NECK: supple, thyroid normal size, non-tender, without nodularity LYMPH:  no palpable lymphadenopathy in the cervical, axillary or inguinal LUNGS: clear to auscultation and percussion with normal breathing effort HEART: regular rate & rhythm and no murmurs and no lower extremity edema ABDOMEN:abdomen soft, non-tender and normal bowel sounds Musculoskeletal:no cyanosis of digits and no clubbing  NEURO: alert & oriented x 3 with fluent speech, no focal motor/sensory deficits  LABORATORY DATA:  I have reviewed the data as listed    Component Value Date/Time   NA 140 08/03/2018 1109   NA 137 02/17/2017 1240   K 3.9 08/03/2018 1109   K 3.6 02/17/2017 1240   CL 106 08/03/2018 1109   CO2 26 08/03/2018 1109   CO2 24 02/17/2017 1240   GLUCOSE 103 (H) 08/03/2018 1109   GLUCOSE 121 02/17/2017 1240   BUN 12 08/03/2018 1109   BUN 7.5 02/17/2017 1240   CREATININE 0.84 08/03/2018 1109   CREATININE 0.9 02/17/2017 1240   CALCIUM 9.5 08/03/2018 1109   CALCIUM 9.6 02/17/2017 1240   PROT 7.0 08/03/2018 1109   PROT 7.0 02/17/2017 1240   ALBUMIN 3.9 08/03/2018 1109   ALBUMIN 3.6 02/17/2017 1240   AST 23 08/03/2018 1109   AST 22 02/17/2017 1240   ALT 37 08/03/2018 1109   ALT 52 02/17/2017 1240   ALKPHOS 93 08/03/2018 1109   ALKPHOS 65 02/17/2017 1240   BILITOT 0.7 08/03/2018 1109   BILITOT 0.36 02/17/2017 1240   GFRNONAA >60 08/03/2018 1109   GFRAA >60 08/03/2018 1109    No results found for: SPEP, UPEP  Lab Results  Component Value Date   WBC 4.5 08/03/2018   NEUTROABS 2.7 08/03/2018   HGB 12.3 08/03/2018   HCT 37.3 08/03/2018   MCV 88.0 08/03/2018   PLT 294 08/03/2018      Chemistry      Component Value Date/Time   NA 140  08/03/2018 1109   NA 137 02/17/2017 1240   K 3.9 08/03/2018 1109   K 3.6 02/17/2017 1240   CL 106 08/03/2018 1109   CO2 26 08/03/2018 1109   CO2 24 02/17/2017 1240   BUN 12 08/03/2018 1109   BUN 7.5 02/17/2017 1240   CREATININE 0.84 08/03/2018 1109   CREATININE 0.9 02/17/2017 1240      Component Value Date/Time   CALCIUM 9.5 08/03/2018 1109   CALCIUM 9.6 02/17/2017 1240   ALKPHOS 93 08/03/2018 1109   ALKPHOS 65 02/17/2017 1240   AST 23 08/03/2018 1109   AST 22 02/17/2017 1240   ALT 37 08/03/2018 1109   ALT 52 02/17/2017 1240   BILITOT 0.7 08/03/2018 1109   BILITOT 0.36 02/17/2017 1240       RADIOGRAPHIC STUDIES: I have personally reviewed the radiological images as listed and agreed with the findings in the report. Ct Chest W Contrast  Result Date: 08/03/2018 CLINICAL DATA:  History of cervical cancer. Status post chemotherapy and XRT. Follow-up lung nodule. EXAM: CT CHEST WITH CONTRAST TECHNIQUE: Multidetector CT imaging of the chest was performed during intravenous contrast administration. CONTRAST:  9mL OMNIPAQUE IOHEXOL 300 MG/ML  SOLN COMPARISON:  05/03/2018 FINDINGS: Cardiovascular: Heart size appears within normal limits. No pericardial effusion. Mediastinum/Nodes: There is similar appearance of triangular-shaped area of soft tissue within the anterior mediastinum, favor rebound thymic hyperplasia. The trachea appears patent and is midline. Normal appearance of the thyroid gland. Normal appearance of the esophagus. No enlarged axillary or supraclavicular lymph nodes. No mediastinal or hilar adenopathy. Lungs/Pleura: There is no pleural effusion. The right upper lobe lung nodule is again noted. On today's study this measures 7 mm, not significantly changed from previous exam, image 39/7. Dependent changes are noted within both lung bases. There is a small thin walled cystic structure within the lateral aspect of the superior segment of right lower lobe measuring 5 mm, image 62/7.  Unchanged from previous exam. A similar thin walled cystic nodule in the left apex measures 3 mm and is unchanged from previous exam. Upper Abdomen: No acute abnormality. Musculoskeletal: No chest wall abnormality. No acute or significant osseous findings. IMPRESSION: 1. Stable exam. No change in solid nodule within the right upper lobe compared with 02/02/18. Continued close interval follow-up is advised. 2. There are 2 tiny thin walled cystic nodules within the left apex and right lower lobe. These are nonspecific and may be postinflammatory in etiology. Pulmonary Langerhans cell histiocytosis may present with thin walled cystic lesions and can be seen almost exclusively in young adults with a history of current or previous smoking. Electronically Signed   By: Kerby Moors M.D.   On: 08/03/2018 16:38    All questions were answered. The patient knows to call the clinic with any problems, questions or concerns. No barriers to learning was detected.  I spent 15 minutes counseling the patient face to face. The total time spent in the appointment was 20 minutes and more than 50% was on counseling and review of test results  Heath Lark, MD 08/04/2018 10:10 AM

## 2018-08-12 ENCOUNTER — Encounter: Payer: Self-pay | Admitting: Hematology and Oncology

## 2018-08-17 ENCOUNTER — Ambulatory Visit: Payer: Self-pay | Admitting: Radiation Oncology

## 2018-08-17 ENCOUNTER — Telehealth: Payer: Self-pay | Admitting: Oncology

## 2018-08-17 ENCOUNTER — Institutional Professional Consult (permissible substitution): Payer: Self-pay | Admitting: Pulmonary Disease

## 2018-08-17 NOTE — Telephone Encounter (Signed)
Left a message for [name] with appointment to see Dr. Fermin Schwab 12/05/2018 at 10:30 am.

## 2018-08-22 ENCOUNTER — Encounter: Payer: Self-pay | Admitting: Pulmonary Disease

## 2018-08-22 ENCOUNTER — Telehealth: Payer: Self-pay | Admitting: Pulmonary Disease

## 2018-08-22 ENCOUNTER — Ambulatory Visit (INDEPENDENT_AMBULATORY_CARE_PROVIDER_SITE_OTHER): Payer: Self-pay | Admitting: Pulmonary Disease

## 2018-08-22 DIAGNOSIS — R911 Solitary pulmonary nodule: Secondary | ICD-10-CM

## 2018-08-22 MED ORDER — ALBUTEROL SULFATE HFA 108 (90 BASE) MCG/ACT IN AERS: 2.0000 | INHALATION_SPRAY | Freq: Four times a day (QID) | RESPIRATORY_TRACT | 0 refills | Status: DC | PRN

## 2018-08-22 NOTE — Patient Instructions (Addendum)
Right upper lobe lung nodule PET-avid nodule since 12/2017 in patient with hx cervical adenocarcinoma s/p chemoradiation -Will discuss management with procedure team  -START Albuterol inhaler 2 puffs every 4 hours as needed for shortness or wheezing -ORDER pulmonary function tests prior to next visit -Follow up in 1 week
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
## 2018-08-22 NOTE — Telephone Encounter (Signed)
Pt had CT Chest 08/03/2018  Dr Loanne Drilling wants to know if this can be changed to a super D CT  I called radiology and spoke with Valarie Merino  He called and spoke with Jinny Blossom with [facility] radiology  She will check on this and call back

## 2018-08-23 NOTE — Telephone Encounter (Signed)
Per [facility]- radiology called and said that they would be sending the Super D over by courier Will forward to Dr Loanne Drilling she she is aware

## 2018-08-24 ENCOUNTER — Ambulatory Visit: Payer: Self-pay | Admitting: Radiation Oncology

## 2018-08-28 ENCOUNTER — Ambulatory Visit
Admission: RE | Admit: 2018-08-28 | Discharge: 2018-08-28 | Disposition: A | Discharge status: Home / Self Care | Payer: Self-pay | Source: Ambulatory Visit | Attending: Radiation Oncology | Admitting: Radiation Oncology

## 2018-08-28 ENCOUNTER — Other Ambulatory Visit: Payer: Self-pay

## 2018-08-28 ENCOUNTER — Encounter: Payer: Self-pay | Admitting: Radiation Oncology

## 2018-08-28 VITALS — BP 110/63 | HR 69 | Temp 97.8°F | Resp 18 | Ht 62.0 in | Wt 167.5 lb

## 2018-08-28 DIAGNOSIS — C530 Malignant neoplasm of endocervix: Secondary | ICD-10-CM | POA: Insufficient documentation

## 2018-08-28 DIAGNOSIS — Z9221 Personal history of antineoplastic chemotherapy: Secondary | ICD-10-CM | POA: Insufficient documentation

## 2018-08-28 DIAGNOSIS — Z87891 Personal history of nicotine dependence: Secondary | ICD-10-CM | POA: Insufficient documentation

## 2018-08-28 DIAGNOSIS — Z79899 Other long term (current) drug therapy: Secondary | ICD-10-CM | POA: Insufficient documentation

## 2018-08-28 NOTE — Progress Notes (Signed)
Pt presents today for f/u with Dr. Sondra Come. Pt is accompanied by wife. Pt denies c/o pain. Pt denies dysuria/hematuria. Pt denies vagina bleeding/discharge. Pt denies rectal bleeding, diarrhea/constipation. Pt denies abdominal bloating, N/V.  BP 110/63 (BP Location: Left Arm, Patient Position: Sitting)   Pulse 69   Temp 97.8 F (36.6 C) (Oral)   Resp 18   Ht 5\' 2"  (1.575 m)   Wt 167 lb 8 oz (76 kg)   SpO2 100%   BMI 30.64 kg/m   Wt Readings from Last 3 Encounters:  08/28/18 167 lb 8 oz (76 kg)  08/22/18 163 lb 12.8 oz (74.3 kg)  08/04/18 166 lb (75.3 kg)   Loma Sousa, RN BSN

## 2018-08-28 NOTE — Progress Notes (Signed)
Radiation Oncology         [id number]) [id number] ________________________________  Name: Joanne Griffin MRN: [id number]  Date: 08/28/2018  DOB: May 16, 1986  Follow-Up Visit Note  CC: Harvest Dark, PA-C  [name],*    ICD-10-CM   1. Malignant neoplasm of endocervix [facility]) C53.0     Diagnosis:   invasive adenocarcinoma of the cervix, clinical stage IB2 adenocarcinoma cervix with radiographically suspicious pelvic lymphadenopathy and left supraclavicular adenopathy  Interval Since Last Radiation:  1 year, 7 months 11/09/2016 - 12/23/2016 and HDR on 01/04/2017, 01/10/2017, 01/17/2017, 01/24/2017, 01/26/2017. Site/dose: 1) The cervix was treated to 45 Gy in 25 fractions,  2) The pelviswas treated to 9 Gy in 5 fractions as a boost to the suspicious pelvic nodes. (54 Gy) 3) Sclav-LT was initially prescribed to 60 Gy in 30 fractions, but the patient discontinued at fraction 28 due to severe skin reactions. (56 Gy) 4) The cervix was also treated to 27.5 Gy in 5 fractions of 5.5 Gy (brachytherapy)  Narrative:  The patient returns today for routine follow-up. She is accompanied by a female friend.  She had a PET scan January 04, 2018 that showed new hypermetabolic RIGHT upper lobe pulmonary nodule coupled with new hypermetabolic RIGHT paratracheal lymph node. Differential includes small pulmonary infection with reactive node versus pulmonary metastasis and mediastinal nodal metastasis. As pulmonary lesion was not present 3 months prior infection is a distinct possibility however the intensity of the paratracheal node favors malignancy. Consider short-term follow-up CT with contrast (1 month). If lesion persists or enlarges malignancy is likely. No evidence of abnormal metabolic the uterine cervix. No pelvic adenopathy. No evidence of malignancy in the occipital region.  Accordingly, she underwent CT on February 02, 2018. This scan showed stable appearance of the 6 mm right upper lobe  pulmonary nodule with no change in the right paratracheal and precarinal lymph nodes. Given hypermetabolism seen on the PET-CT 1 month ago, continued close attention will be required.  Most recent CT occurred on 08/03/18 and revealed a stable exam. No change in solid nodule within the right upper lobe compared with 02/02/18. Continued close interval follow-up is advised. There are 2 tiny thin walled cystic nodules within the left apex and right lower lobe. These are nonspecific and may be postinflammatory in etiology. Pulmonary Langerhans cell histiocytosis may present with thin walled cystic lesions and can be seen almost exclusively in young adults with a history of current or previous smoking.  She denies spitting up blood, vaginal/rectal bleeding and any other symptoms.  REVIEW OF SYSTEMS: A 10+ POINT REVIEW OF SYSTEMS WAS OBTAINED including neurology, dermatology, psychiatry, cardiac, respiratory, lymph, extremities, GI, GU, musculoskeletal, constitutional, reproductive, HEENT. All pertinent positives are noted in the HPI. All others are negative.                               ALLERGIES:  is allergic to amoxicillin and penicillins.  Meds: Current Outpatient Medications  Medication Sig Dispense Refill  . albuterol (PROAIR HFA) 108 (90 Base) MCG/ACT inhaler Inhale 2 puffs into the lungs every 6 (six) hours as needed for wheezing or shortness of breath. 1 Inhaler 0  . nicotine (NICODERM CQ - DOSED IN MG/24 HOURS) 14 mg/24hr patch Place 14 mg onto the skin daily.     No current facility-administered medications for this encounter.     Physical Findings: The patient is in no acute distress. Patient is alert and oriented.  height is 5\' 2"  (1.575 m) and weight is 167 lb 8 oz (76 kg). Her oral temperature is 97.8 F (36.6 C). Her blood pressure is 110/63 and her pulse is 69. Her respiration is 18 and oxygen saturation is 100%.    No palpable supraclavicular or axillary adenopathy. The lungs are  clear to auscultation. The heart has a regular rhythm and rate. The abdomen is soft and nontender with normal bowel sounds. No inguinal adenopathy is appreciated.   On pelvic examination the external genitalia are unremarkable. A speculum exam is performed. There are no mucosal lesions noted in the vaginal vault. Cervix is flush with upper vaginal vault. Radiation changes noted at the cervical os. On bimanual and rectovaginal examination no  pelvic masses are appreciated. Cervix is normal in size. Good rectal sphincter tone.    Lab Findings: Lab Results  Component Value Date   WBC 4.5 08/03/2018   HGB 12.3 08/03/2018   HCT 37.3 08/03/2018   MCV 88.0 08/03/2018   PLT 294 08/03/2018    Radiographic Findings: Ct Chest W Contrast  Result Date: 08/03/2018 CLINICAL DATA:  History of cervical cancer. Status post chemotherapy and XRT. Follow-up lung nodule. EXAM: CT CHEST WITH CONTRAST TECHNIQUE: Multidetector CT imaging of the chest was performed during intravenous contrast administration. CONTRAST:  9mL OMNIPAQUE IOHEXOL 300 MG/ML  SOLN COMPARISON:  05/03/2018 FINDINGS: Cardiovascular: Heart size appears within normal limits. No pericardial effusion. Mediastinum/Nodes: There is similar appearance of triangular-shaped area of soft tissue within the anterior mediastinum, favor rebound thymic hyperplasia. The trachea appears patent and is midline. Normal appearance of the thyroid gland. Normal appearance of the esophagus. No enlarged axillary or supraclavicular lymph nodes. No mediastinal or hilar adenopathy. Lungs/Pleura: There is no pleural effusion. The right upper lobe lung nodule is again noted. On today's study this measures 7 mm, not significantly changed from previous exam, image 39/7. Dependent changes are noted within both lung bases. There is a small thin walled cystic structure within the lateral aspect of the superior segment of right lower lobe measuring 5 mm, image 62/7. Unchanged from  previous exam. A similar thin walled cystic nodule in the left apex measures 3 mm and is unchanged from previous exam. Upper Abdomen: No acute abnormality. Musculoskeletal: No chest wall abnormality. No acute or significant osseous findings. IMPRESSION: 1. Stable exam. No change in solid nodule within the right upper lobe compared with 02/02/18. Continued close interval follow-up is advised. 2. There are 2 tiny thin walled cystic nodules within the left apex and right lower lobe. These are nonspecific and may be postinflammatory in etiology. Pulmonary Langerhans cell histiocytosis may present with thin walled cystic lesions and can be seen almost exclusively in young adults with a history of current or previous smoking. Electronically Signed   By: Kerby Moors M.D.   On: 08/03/2018 16:38    Impression: Clinically, there is no evidence of reccurrence on exam.   Plan:  Follow-up GYN Oncology in June with Dr. Fermin Schwab. Routine follow-up in Radiation Oncology in September. She is being evaluated by Dr. Loanne Drilling concerning  pulmonary lesions ____________________________________  Blair Promise, PhD, MD  This document serves as a record of services personally performed by Blair Promise, PhD, MD. It was created on his behalf by Mary-Margaret Loma Messing, a trained medical scribe. The creation of this record is based on the scribe's personal observations and the provider's statements to them.

## 2018-09-07 ENCOUNTER — Ambulatory Visit (INDEPENDENT_AMBULATORY_CARE_PROVIDER_SITE_OTHER): Payer: Self-pay | Admitting: Pulmonary Disease

## 2018-09-07 ENCOUNTER — Encounter: Payer: Self-pay | Admitting: Pulmonary Disease

## 2018-09-07 ENCOUNTER — Other Ambulatory Visit: Payer: Self-pay

## 2018-09-07 VITALS — BP 120/78 | HR 86 | Ht 62.0 in | Wt 164.0 lb

## 2018-09-07 DIAGNOSIS — R911 Solitary pulmonary nodule: Secondary | ICD-10-CM

## 2018-09-07 LAB — PULMONARY FUNCTION TEST
DL/VA % pred: 107 %
DL/VA: 4.94 ml/min/mmHg/L
DLCO COR % PRED: 118 %
DLCO cor: 24.76 ml/min/mmHg
DLCO unc % pred: 114 %
DLCO unc: 23.88 ml/min/mmHg
FEF 25-75 POST: 2.4 L/s
FEF 25-75 Pre: 2.21 L/sec
FEF2575-%Change-Post: 8 %
FEF2575-%Pred-Post: 73 %
FEF2575-%Pred-Pre: 67 %
FEV1-%CHANGE-POST: 2 %
FEV1-%PRED-POST: 94 %
FEV1-%PRED-PRE: 91 %
FEV1-POST: 2.79 L
FEV1-PRE: 2.71 L
FEV1FVC-%Change-Post: 1 %
FEV1FVC-%PRED-PRE: 90 %
FEV6-%Change-Post: 2 %
FEV6-%PRED-POST: 104 %
FEV6-%Pred-Pre: 101 %
FEV6-POST: 3.63 L
FEV6-Pre: 3.54 L
FEV6FVC-%Change-Post: 0 %
FEV6FVC-%PRED-POST: 100 %
FEV6FVC-%Pred-Pre: 99 %
FVC-%CHANGE-POST: 1 %
FVC-%PRED-POST: 103 %
FVC-%PRED-PRE: 101 %
FVC-POST: 3.64 L
FVC-Pre: 3.59 L
POST FEV6/FVC RATIO: 100 %
PRE FEV6/FVC RATIO: 99 %
Post FEV1/FVC ratio: 77 %
Pre FEV1/FVC ratio: 76 %
RV % PRED: 119 %
RV: 1.61 L
TLC % PRED: 112 %
TLC: 5.35 L

## 2018-09-07 NOTE — Progress Notes (Signed)
Full PFT performed today. °

## 2018-09-07 NOTE — Progress Notes (Signed)
   Subjective:    Patient ID: Joanne Griffin, female    DOB: 09-02-1985, 33 y.o.   MRN: [id number]  HPI    Review of Systems  HENT: Positive for congestion and tinnitus.   Genitourinary: Positive for frequency.  Psychiatric/Behavioral: The patient is nervous/anxious.        Objective:   Physical Exam        Assessment & Plan:
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
## 2018-09-12 ENCOUNTER — Other Ambulatory Visit: Payer: Self-pay

## 2018-09-12 ENCOUNTER — Encounter (HOSPITAL_COMMUNITY): Payer: Self-pay | Admitting: *Deleted

## 2018-09-12 NOTE — Progress Notes (Signed)
Spoke with patient regarding pre-op instructions.  Patient denies chest pain, SOB or DM.

## 2018-09-13 ENCOUNTER — Ambulatory Visit (HOSPITAL_COMMUNITY)
Admission: RE | Admit: 2018-09-13 | Discharge: 2018-09-13 | Disposition: A | Discharge status: Home / Self Care | Payer: Self-pay | Attending: Pulmonary Disease | Admitting: Pulmonary Disease

## 2018-09-13 ENCOUNTER — Ambulatory Visit (HOSPITAL_COMMUNITY): Payer: Self-pay | Admitting: Certified Registered"

## 2018-09-13 ENCOUNTER — Other Ambulatory Visit: Payer: Self-pay

## 2018-09-13 ENCOUNTER — Encounter (HOSPITAL_COMMUNITY)
Admission: RE | Disposition: A | Discharge status: Home / Self Care | Payer: Self-pay | Source: Home / Self Care | Attending: Pulmonary Disease

## 2018-09-13 ENCOUNTER — Encounter (HOSPITAL_COMMUNITY): Payer: Self-pay

## 2018-09-13 DIAGNOSIS — Z87891 Personal history of nicotine dependence: Secondary | ICD-10-CM | POA: Insufficient documentation

## 2018-09-13 DIAGNOSIS — Z9221 Personal history of antineoplastic chemotherapy: Secondary | ICD-10-CM | POA: Insufficient documentation

## 2018-09-13 DIAGNOSIS — Z923 Personal history of irradiation: Secondary | ICD-10-CM | POA: Insufficient documentation

## 2018-09-13 DIAGNOSIS — R590 Localized enlarged lymph nodes: Secondary | ICD-10-CM | POA: Insufficient documentation

## 2018-09-13 DIAGNOSIS — R911 Solitary pulmonary nodule: Secondary | ICD-10-CM | POA: Insufficient documentation

## 2018-09-13 DIAGNOSIS — Z8541 Personal history of malignant neoplasm of cervix uteri: Secondary | ICD-10-CM | POA: Insufficient documentation

## 2018-09-13 HISTORY — PX: VIDEO BRONCHOSCOPY WITH ENDOBRONCHIAL ULTRASOUND: SHX6177

## 2018-09-13 LAB — CBC
HCT: 39.3 % (ref 36.0–46.0)
HEMOGLOBIN: 12.7 g/dL (ref 12.0–15.0)
MCH: 28.5 pg (ref 26.0–34.0)
MCHC: 32.3 g/dL (ref 30.0–36.0)
MCV: 88.1 fL (ref 80.0–100.0)
Platelets: 325 10*3/uL (ref 150–400)
RBC: 4.46 MIL/uL (ref 3.87–5.11)
RDW: 12.9 % (ref 11.5–15.5)
WBC: 4.3 10*3/uL (ref 4.0–10.5)
nRBC: 0 % (ref 0.0–0.2)

## 2018-09-13 LAB — COMPREHENSIVE METABOLIC PANEL
ALT: 26 U/L (ref 0–44)
ANION GAP: 7 (ref 5–15)
AST: 21 U/L (ref 15–41)
Albumin: 3.7 g/dL (ref 3.5–5.0)
Alkaline Phosphatase: 83 U/L (ref 38–126)
BUN: 12 mg/dL (ref 6–20)
CHLORIDE: 106 mmol/L (ref 98–111)
CO2: 23 mmol/L (ref 22–32)
Calcium: 9.2 mg/dL (ref 8.9–10.3)
Creatinine, Ser: 0.75 mg/dL (ref 0.44–1.00)
GFR calc Af Amer: >60 mL/min (ref >60)
GFR calc non Af Amer: >60 mL/min (ref >60)
Glucose, Bld: 109 mg/dL — ABNORMAL HIGH (ref 70–99)
POTASSIUM: 3.3 mmol/L — AB (ref 3.5–5.1)
Sodium: 136 mmol/L (ref 135–145)
Total Bilirubin: 0.3 mg/dL (ref 0.3–1.2)
Total Protein: 6.7 g/dL (ref 6.5–8.1)

## 2018-09-13 LAB — POCT PREGNANCY, URINE: Preg Test, Ur: NEGATIVE

## 2018-09-13 LAB — PROTIME-INR
INR: 0.9 (ref 0.8–1.2)
Prothrombin Time: 12.4 seconds (ref 11.4–15.2)

## 2018-09-13 LAB — APTT: aPTT: 30 seconds (ref 24–36)

## 2018-09-13 SURGERY — BRONCHOSCOPY, WITH EBUS
Anesthesia: General | Laterality: Right

## 2018-09-13 MED ORDER — FENTANYL CITRATE (PF) 250 MCG/5ML IJ SOLN
INTRAMUSCULAR | Status: DC | PRN
  Administered 2018-09-13 (×2): 25 ug via INTRAVENOUS
  Administered 2018-09-13 (×3): 50 ug via INTRAVENOUS

## 2018-09-13 MED ORDER — 0.9 % SODIUM CHLORIDE (POUR BTL) OPTIME
TOPICAL | Status: DC | PRN
  Administered 2018-09-13: 1000 mL

## 2018-09-13 MED ORDER — PHENYLEPHRINE 40 MCG/ML (10ML) SYRINGE FOR IV PUSH (FOR BLOOD PRESSURE SUPPORT)
PREFILLED_SYRINGE | INTRAVENOUS | Status: AC
  Filled 2018-09-13: qty 10

## 2018-09-13 MED ORDER — EPHEDRINE SULFATE 50 MG/ML IJ SOLN
INTRAMUSCULAR | Status: DC | PRN
  Administered 2018-09-13 (×2): 5 mg via INTRAVENOUS
  Administered 2018-09-13: 10 mg via INTRAVENOUS

## 2018-09-13 MED ORDER — OXYCODONE HCL 5 MG PO TABS: 5.0000 mg | ORAL_TABLET | Freq: Once | ORAL | Status: DC | PRN

## 2018-09-13 MED ORDER — ONDANSETRON HCL 4 MG/2ML IJ SOLN
INTRAMUSCULAR | Status: AC
  Filled 2018-09-13: qty 2

## 2018-09-13 MED ORDER — DEXAMETHASONE SODIUM PHOSPHATE 10 MG/ML IJ SOLN
INTRAMUSCULAR | Status: DC | PRN
  Administered 2018-09-13: 5 mg via INTRAVENOUS

## 2018-09-13 MED ORDER — MIDAZOLAM HCL 2 MG/2ML IJ SOLN
INTRAMUSCULAR | Status: DC | PRN
  Administered 2018-09-13: 2 mg via INTRAVENOUS

## 2018-09-13 MED ORDER — PHENYLEPHRINE HCL 10 MG/ML IJ SOLN
INTRAMUSCULAR | Status: DC | PRN
  Administered 2018-09-13 (×2): 80 ug via INTRAVENOUS
  Administered 2018-09-13 (×2): 120 ug via INTRAVENOUS
  Administered 2018-09-13: 80 ug via INTRAVENOUS
  Administered 2018-09-13 (×2): 120 ug via INTRAVENOUS

## 2018-09-13 MED ORDER — LIDOCAINE 2% (20 MG/ML) 5 ML SYRINGE
INTRAMUSCULAR | Status: DC | PRN
  Administered 2018-09-13: 60 mg via INTRAVENOUS

## 2018-09-13 MED ORDER — LACTATED RINGERS IV SOLN
INTRAVENOUS | Status: DC
  Administered 2018-09-13 (×2): via INTRAVENOUS

## 2018-09-13 MED ORDER — ONDANSETRON HCL 4 MG/2ML IJ SOLN: 4.0000 mg | Freq: Once | INTRAMUSCULAR | Status: DC | PRN

## 2018-09-13 MED ORDER — LIDOCAINE 2% (20 MG/ML) 5 ML SYRINGE
INTRAMUSCULAR | Status: AC
  Filled 2018-09-13: qty 5

## 2018-09-13 MED ORDER — PROPOFOL 10 MG/ML IV BOLUS
INTRAVENOUS | Status: DC | PRN
  Administered 2018-09-13: 170 mg via INTRAVENOUS

## 2018-09-13 MED ORDER — OXYCODONE HCL 5 MG/5ML PO SOLN: 5.0000 mg | Freq: Once | ORAL | Status: DC | PRN

## 2018-09-13 MED ORDER — FENTANYL CITRATE (PF) 250 MCG/5ML IJ SOLN
INTRAMUSCULAR | Status: AC
  Filled 2018-09-13: qty 5

## 2018-09-13 MED ORDER — FENTANYL CITRATE (PF) 100 MCG/2ML IJ SOLN: 25.0000 ug | INTRAMUSCULAR | Status: DC | PRN

## 2018-09-13 MED ORDER — DEXAMETHASONE SODIUM PHOSPHATE 10 MG/ML IJ SOLN
INTRAMUSCULAR | Status: AC
  Filled 2018-09-13: qty 1

## 2018-09-13 MED ORDER — ROCURONIUM BROMIDE 50 MG/5ML IV SOSY
PREFILLED_SYRINGE | INTRAVENOUS | Status: AC
  Filled 2018-09-13: qty 5

## 2018-09-13 MED ORDER — SUCCINYLCHOLINE CHLORIDE 200 MG/10ML IV SOSY
PREFILLED_SYRINGE | INTRAVENOUS | Status: AC
  Filled 2018-09-13: qty 10

## 2018-09-13 MED ORDER — ONDANSETRON HCL 4 MG/2ML IJ SOLN
INTRAMUSCULAR | Status: DC | PRN
  Administered 2018-09-13: 4 mg via INTRAVENOUS

## 2018-09-13 MED ORDER — MIDAZOLAM HCL 2 MG/2ML IJ SOLN
INTRAMUSCULAR | Status: AC
  Filled 2018-09-13: qty 2

## 2018-09-13 MED ORDER — SUGAMMADEX SODIUM 200 MG/2ML IV SOLN
INTRAVENOUS | Status: DC | PRN
  Administered 2018-09-13: 147.8 mg via INTRAVENOUS

## 2018-09-13 MED ORDER — ROCURONIUM BROMIDE 50 MG/5ML IV SOSY
PREFILLED_SYRINGE | INTRAVENOUS | Status: DC | PRN
  Administered 2018-09-13: 50 mg via INTRAVENOUS

## 2018-09-13 SURGICAL SUPPLY — 40 items
ADAPTER VALVE BIOPSY EBUS (MISCELLANEOUS) IMPLANT
ADPTR VALVE BIOPSY EBUS (MISCELLANEOUS)
BALLN FOR EBUS SCOPE (BALLOONS) ×3
BALLOON FOR EBUS SCOPE (BALLOONS) ×1 IMPLANT
BALN ESCP STRL LXBF-UC160F (BALLOONS) ×1
BRUSH CYTOL CELLEBRITY 1.5X140 (MISCELLANEOUS) IMPLANT
CANISTER SUCT 3000ML PPV (MISCELLANEOUS) ×3 IMPLANT
CONT SPEC 4OZ CLIKSEAL STRL BL (MISCELLANEOUS) ×3 IMPLANT
COVER BACK TABLE 60X90IN (DRAPES) ×3 IMPLANT
FORCEPS BIOP RJ4 1.8 (CUTTING FORCEPS) IMPLANT
GAUZE SPONGE 4X4 12PLY STRL (GAUZE/BANDAGES/DRESSINGS) ×3 IMPLANT
GLOVE BIO SURGEON STRL SZ 6.5 (GLOVE) ×2 IMPLANT
GLOVE BIO SURGEONS STRL SZ 6.5 (GLOVE) ×1
GOWN STRL REUS W/ TWL LRG LVL3 (GOWN DISPOSABLE) ×1 IMPLANT
GOWN STRL REUS W/TWL LRG LVL3 (GOWN DISPOSABLE) ×3
KIT CLEAN ENDO COMPLIANCE (KITS) ×6 IMPLANT
KIT TURNOVER KIT B (KITS) ×3 IMPLANT
MARKER SKIN DUAL TIP RULER LAB (MISCELLANEOUS) ×3 IMPLANT
NDL ASPIRATION VIZISHOT 19G (NEEDLE) IMPLANT
NDL ASPIRATION VIZISHOT 21G (NEEDLE) ×1 IMPLANT
NEEDLE ASPIRATION VIZISHOT 19G (NEEDLE) IMPLANT
NEEDLE ASPIRATION VIZISHOT 21G (NEEDLE) ×6 IMPLANT
NS IRRIG 1000ML POUR BTL (IV SOLUTION) ×3 IMPLANT
OIL SILICONE PENTAX (PARTS (SERVICE/REPAIRS)) ×3 IMPLANT
PAD ARMBOARD 7.5X6 YLW CONV (MISCELLANEOUS) ×6 IMPLANT
STOPCOCK 4 WAY LG BORE MALE ST (IV SETS) ×2 IMPLANT
SYR 20CC LL (SYRINGE) ×6 IMPLANT
SYR 20ML ECCENTRIC (SYRINGE) ×6 IMPLANT
SYR 50ML SLIP (SYRINGE) IMPLANT
SYR 5ML LUER SLIP (SYRINGE) ×3 IMPLANT
TOWEL OR 17X24 6PK STRL BLUE (TOWEL DISPOSABLE) ×3 IMPLANT
TRAP SPECIMEN MUCOUS 40CC (MISCELLANEOUS) IMPLANT
TUBE CONNECTING 20'X1/4 (TUBING) ×2
TUBE CONNECTING 20X1/4 (TUBING) ×4 IMPLANT
TUBING CIL FLEX 10 FLL-RA (TUBING) ×2 IMPLANT
UNDERPAD 30X30 (UNDERPADS AND DIAPERS) ×3 IMPLANT
VALVE BIOPSY  SINGLE USE (MISCELLANEOUS) ×2
VALVE BIOPSY SINGLE USE (MISCELLANEOUS) ×1 IMPLANT
VALVE SUCTION BRONCHIO DISP (MISCELLANEOUS) ×3 IMPLANT
WATER STERILE IRR 1000ML POUR (IV SOLUTION) ×5 IMPLANT

## 2018-09-13 NOTE — H&P (Signed)
Joanne Griffin is an 33 y.o. female.   Chief Complaint: RUL lung nodule, PET-avid right hilar adenopathy HPI: 33 year old female with hx cervical cancer who presents for diagnostic bronchoscopy for PET-avid RUL, right paratracheal lymph node and right hilar mass  Past Medical History:  Diagnosis Date  . Anxiety   . Cervical cancer [facility]) oncologist-  dr gorsuch/  dr Sondra Come   dx 09-01-2016-- Stage IB2--  Invasive cervix adenocarcinoma-- completed chemotherapy (11-09-2016 to 12-08-2016) concurrent w/ external beam radiation (completed 12-22-2016))  . Depression   . History of cancer chemotherapy 11-09-2016 to 12-08-2016  . History of radiation therapy 11-09-2016  to 12-22-2016   cervix was treated to 45 Gy, pelvis was treated to 9 Gy, Sclav left was treated to 56 Gy.  Marland Kitchen History of radiation therapy 01/04/17, 01/10/17, 01/17/17, 01/24/17, 01/26/17   cervix was treated to 27.5 Gy in 5 fractions  . Leukopenia due to antineoplastic chemotherapy [facility])     Past Surgical History:  Procedure Laterality Date  . CYST REMOVAL HAND Right age 74  . IR FLUORO GUIDE PORT INSERTION RIGHT  10/29/2016  . IR REMOVAL TUN ACCESS W/ PORT W/O FL MOD SED  03/15/2017  . IR US GUIDE VASC ACCESS RIGHT  10/29/2016  . TANDEM RING INSERTION N/A 01/04/2017   Procedure: TANDEM RING INSERTION;  Surgeon: Gery Pray, MD;  Location: [facility];  Service: Urology;  Laterality: N/A;  . TANDEM RING INSERTION N/A 01/10/2017   Procedure: TANDEM RING INSERTION;  Surgeon: Gery Pray, MD;  Location: [facility];  Service: Urology;  Laterality: N/A;  . TANDEM RING INSERTION N/A 01/17/2017   Procedure: TANDEM RING INSERTION;  Surgeon: Gery Pray, MD;  Location: [facility];  Service: Urology;  Laterality: N/A;  . TANDEM RING INSERTION N/A 01/24/2017   Procedure: TANDEM RING INSERTION;  Surgeon: Gery Pray, MD;  Location: [facility];  Service: Urology;  Laterality: N/A;  .  TANDEM RING INSERTION N/A 01/26/2017   Procedure: TANDEM RING INSERTION;  Surgeon: Gery Pray, MD;  Location: [facility];  Service: Urology;  Laterality: N/A;  . WISDOM TOOTH EXTRACTION     times 4    Family History  Problem Relation Age of Onset  . Diabetes Maternal Grandmother   . Breast cancer Paternal Grandmother    Social History:  reports that she quit smoking about 4 weeks ago. Her smoking use included cigarettes. She has a 12.00 pack-year smoking history. She has never used smokeless tobacco. She reports current drug use. Drug: Marijuana. She reports that she does not drink alcohol.  Allergies:  Allergies  Allergen Reactions  . Amoxicillin Hives    Has patient had a PCN reaction causing immediate rash, facial/tongue/throat swelling, SOB or lightheadedness with hypotension: Yes Has patient had a PCN reaction causing severe rash involving mucus membranes or skin necrosis: Unknown Has patient had a PCN reaction that required hospitalization: Unknown Has patient had a PCN reaction occurring within the last 10 years: No If all of the above answers are "NO", then may proceed with Cephalosporin use.   Marland Kitchen Penicillins Other (See Comments)    UNKNOWN CHILDHOOD REACTION Has patient had a PCN reaction causing immediate rash, facial/tongue/throat swelling, SOB or lightheadedness with hypotension: Yes Has patient had a PCN reaction causing severe rash involving mucus membranes or skin necrosis: Unknown Has patient had a PCN reaction that required hospitalization: Unknown Has patient had a PCN reaction occurring within the last 10  years: No If all of the above answers are "NO", then may proceed with Cephalosporin use.      Medications Prior to Admission  Medication Sig Dispense Refill  . albuterol (PROAIR HFA) 108 (90 Base) MCG/ACT inhaler Inhale 2 puffs into the lungs every 6 (six) hours as needed for wheezing or shortness of breath. 1 Inhaler 0    Results for orders  placed or performed during the hospital encounter of 09/13/18 (from the past 48 hour(s))  Comprehensive metabolic panel     Status: Abnormal   Collection Time: 09/13/18 10:25 AM  Result Value Ref Range   Sodium 136 135 - 145 mmol/L   Potassium 3.3 (L) 3.5 - 5.1 mmol/L   Chloride 106 98 - 111 mmol/L   CO2 23 22 - 32 mmol/L   Glucose, Bld 109 (H) 70 - 99 mg/dL   BUN 12 6 - 20 mg/dL   Creatinine, Ser 0.75 0.44 - 1.00 mg/dL   Calcium 9.2 8.9 - 10.3 mg/dL   Total Protein 6.7 6.5 - 8.1 g/dL   Albumin 3.7 3.5 - 5.0 g/dL   AST 21 15 - 41 U/L   ALT 26 0 - 44 U/L   Alkaline Phosphatase 83 38 - 126 U/L   Total Bilirubin 0.3 0.3 - 1.2 mg/dL   GFR calc non Af Amer >60 >60 mL/min   GFR calc Af Amer >60 >60 mL/min   Anion gap 7 5 - 15    Comment: Performed at [facility], [address], [place], [place] [address]  CBC     Status: None   Collection Time: 09/13/18 10:25 AM  Result Value Ref Range   WBC 4.3 4.0 - 10.5 K/uL   RBC 4.46 3.87 - 5.11 MIL/uL   Hemoglobin 12.7 12.0 - 15.0 g/dL   HCT 39.3 36.0 - 46.0 %   MCV 88.1 80.0 - 100.0 fL   MCH 28.5 26.0 - 34.0 pg   MCHC 32.3 30.0 - 36.0 g/dL   RDW 12.9 11.5 - 15.5 %   Platelets 325 150 - 400 K/uL   nRBC 0.0 0.0 - 0.2 %    Comment: Performed at [facility], [place] [address], [place], [place] [address]  Protime-INR     Status: None   Collection Time: 09/13/18 10:25 AM  Result Value Ref Range   Prothrombin Time 12.4 11.4 - 15.2 seconds   INR 0.9 0.8 - 1.2    Comment: (NOTE) INR goal varies based on device and disease states. Performed at [facility], [place] [address], [place], [place] [address]   APTT     Status: None   Collection Time: 09/13/18 10:25 AM  Result Value Ref Range   aPTT 30 24 - 36 seconds    Comment: Performed at [facility] [address] [place], [place] [address]  Pregnancy, urine POC     Status: None   Collection Time: 09/13/18 10:51 AM  Result Value Ref Range   Preg Test, Ur NEGATIVE  NEGATIVE    Comment:        THE SENSITIVITY OF THIS METHODOLOGY IS >24 mIU/mL    No results found.  Review of Systems  Constitutional: Negative for chills, diaphoresis, fever, malaise/fatigue and weight loss.  HENT: Negative for congestion, ear pain and sore throat.   Respiratory: Negative for cough, hemoptysis, sputum production, shortness of breath and wheezing.   Cardiovascular: Negative for chest pain, palpitations and leg swelling.  Gastrointestinal:  Negative for abdominal pain, heartburn and nausea.  Genitourinary: Negative for frequency.  Musculoskeletal: Negative for joint pain and myalgias.  Skin: Negative for itching and rash.  Neurological: Negative for dizziness, weakness and headaches.  Endo/Heme/Allergies: Does not bruise/bleed easily.  Psychiatric/Behavioral: Negative for depression. The patient is not nervous/anxious.     Blood pressure 127/67, pulse 60, temperature 97.8 F (36.6 C), temperature source Oral, resp. rate 18, height 5\' 2"  (1.575 m), weight 73.9 kg, last menstrual period 05/28/2016, SpO2 97 %. Physical Exam   Assessment/Plan RUL lung nodule PET-avid lung nodule   Plan for diagnostic bronchoscopy with endobronchial ultrasound  Sakiyah Shur Rodman Pickle, MD 09/13/2018, 12:10 PM

## 2018-09-13 NOTE — Transfer of Care (Signed)
Immediate Anesthesia Transfer of Care Note  Patient: Joanne Griffin  Procedure(s) Performed: VIDEO BRONCHOSCOPY WITH ENDOBRONCHIAL ULTRASOUND (Right )  Patient Location: PACU  Anesthesia Type:General  Level of Consciousness: drowsy and patient cooperative  Airway & Oxygen Therapy: Patient Spontanous Breathing and Patient connected to face mask oxygen  Post-op Assessment: Report given to RN and Post -op Vital signs reviewed and stable  Post vital signs: Reviewed and stable  Last Vitals:  Vitals Value Taken Time  BP    Temp    Pulse    Resp    SpO2      Last Pain:  Vitals:   09/13/18 1042  TempSrc:   PainSc: 0-No pain      Patients Stated Pain Goal: 3 (18/86/77 3736)  Complications: No apparent anesthesia complications

## 2018-09-13 NOTE — Anesthesia Postprocedure Evaluation (Signed)
Anesthesia Post Note  Patient: Joanne Griffin  Procedure(s) Performed: VIDEO BRONCHOSCOPY WITH ENDOBRONCHIAL ULTRASOUND (Right )     Patient location during evaluation: PACU Anesthesia Type: General Level of consciousness: awake and alert Pain management: pain level controlled Vital Signs Assessment: post-procedure vital signs reviewed and stable Respiratory status: spontaneous breathing, nonlabored ventilation, respiratory function stable and patient connected to nasal cannula oxygen Cardiovascular status: blood pressure returned to baseline and stable Postop Assessment: no apparent nausea or vomiting Anesthetic complications: no    Last Vitals:  Vitals:   09/13/18 1500 09/13/18 1515  BP: 133/83 100/82  Pulse: 88 70  Resp: 17   Temp:    SpO2: 94% 98%    Last Pain:  Vitals:   09/13/18 1500  TempSrc:   PainSc: 0-No pain                 Shalice Woodring COKER

## 2018-09-13 NOTE — Progress Notes (Signed)
Spoke with Dr. Loanne Drilling regarding orders- MD states patient does not need current CXR- will d/c order to confirm PA and Lateral CXR within 72 hours.

## 2018-09-13 NOTE — Anesthesia Procedure Notes (Signed)
Procedure Name: Intubation Date/Time: 09/13/2018 12:30 PM Performed by: Kathryne Hitch, CRNA Pre-anesthesia Checklist: Patient identified, Emergency Drugs available, Suction available, Timeout performed and Patient being monitored Patient Re-evaluated:Patient Re-evaluated prior to induction Oxygen Delivery Method: Circle system utilized Induction Type: IV induction and Rapid sequence Laryngoscope Size: Miller and 2 Grade View: Grade I Tube type: Oral Tube size: 8.5 mm Number of attempts: 1 Airway Equipment and Method: Stylet Placement Confirmation: ETT inserted through vocal cords under direct vision,  positive ETCO2 and breath sounds checked- equal and bilateral Secured at: 21 cm Tube secured with: Tape Dental Injury: Teeth and Oropharynx as per pre-operative assessment

## 2018-09-13 NOTE — Op Note (Signed)
Video Bronchoscopy with Endobronchial Ultrasound Procedure Note  Date of Operation: 09/13/2018  Pre-op Diagnosis: RUL Lung nodule  Post-op Diagnosis: RUL Lung nodule  Surgeon: Margaretha Seeds, MD  Assistants:   Circulator: Georges Lynch, RN, Elease Hashimoto, RN, Gladstone Lighter, RN Scrub Tech: Norva Riffle, CST  Anesthesia: Roberts Gaudy, MD, Tally Due, CRNA and Rush Farmer, CRNA  Operation: Flexible video fiberoptic bronchoscopy with endobronchial ultrasound and biopsies.  Estimated Blood Loss: Minimal  Complications: None  Indications and History: Joanne Griffin is a 33 y.o. female with history cervical cancer with right upper lobe lung nodule.  The risks, benefits, complications, treatment options and expected outcomes were discussed with the patient.  The possibilities of pneumothorax, pneumonia, reaction to medication, pulmonary aspiration, perforation of a viscus, bleeding, failure to diagnose a condition and creating a complication requiring transfusion or operation were discussed with the patient who freely signed the consent.    Description of Procedure: The patient was examined in the preoperative area and history and data from the preprocedure consultation were reviewed. It was deemed appropriate to proceed.  The patient was taken to OR 10, identified as [name] and the procedure verified as Flexible Video Fiberoptic Bronchoscopy.  A Time Out was held and the above information confirmed. After being taken to the operating room general anesthesia was initiated and the patient  was orally intubated.   The video fiberoptic bronchoscope with endobronchial ultrasound was introduced via the endotracheal tube and a general inspection was performed. RUL, RML, RLL, LUL, LLL and their subsegments were visualized. Exam showed showed normal tracheobronchial tree anatomy, normal mucosa and minimal secretions easily suctioned and cleared  The endobronchial ultrasound  was used to identify and characterize the peritracheal, hilar and bronchial lymph nodes. Station 4L, 10L and 11L visualized with no or <34mm lymph nodes present. Station 10R and 11R with no or <36mm lymph nodes present. Transbronchial needle sampling was performed as noted below.   Samples: 1. TBNA x 6 from 4R/paratracheal node  2. TBNA x 3 from 7 node 3. TBNA x 6 for cell block from 4R/paratracheal node   The patient tolerated the procedure well without apparent complications. There was no significant blood loss. The bronchoscope was withdrawn. Anesthesia was reversed and the patient was taken to the PACU for recovery.  Plans:  The patient will be discharged from the PACU to home when recovered from anesthesia. We will review the cytology, pathology and microbiology results with the patient when they become available.   Rodman Pickle, M.D. [facility] Pulmonary/Critical Care Medicine Pager: [phone] After hours pager: [phone]

## 2018-09-13 NOTE — Discharge Instructions (Addendum)
Follow up with Dr. Loanne Drilling end of March or beginning of April. Results should be back in 5 days. Call if not back within 5 days. You make cough up small amount of blood  Call MD if: Cough up more than 2 tablespoons of blood Fever >100.8 Call if you have pain not relieved by over the counter pain medications CP/SOB call MD/911

## 2018-09-13 NOTE — Anesthesia Preprocedure Evaluation (Signed)
Anesthesia Evaluation  Patient identified by MRN, date of birth, ID band Patient awake    Reviewed: Allergy & Precautions, NPO status , Patient's Chart, lab work & pertinent test results  Airway Mallampati: II  TM Distance: >3 FB Neck ROM: Full    Dental  (+) Teeth Intact, Dental Advisory Given   Pulmonary former smoker,    breath sounds clear to auscultation       Cardiovascular  Rhythm:Regular Rate:Normal     Neuro/Psych    GI/Hepatic   Endo/Other    Renal/GU      Musculoskeletal   Abdominal   Peds  Hematology   Anesthesia Other Findings   Reproductive/Obstetrics                             Anesthesia Physical Anesthesia Plan  ASA: II  Anesthesia Plan: General   Post-op Pain Management:    Induction: Intravenous  PONV Risk Score and Plan: Dexamethasone and Ondansetron  Airway Management Planned: Oral ETT  Additional Equipment:   Intra-op Plan:   Post-operative Plan: Extubation in OR  Informed Consent: I have reviewed the patients History and Physical, chart, labs and discussed the procedure including the risks, benefits and alternatives for the proposed anesthesia with the patient or authorized representative who has indicated his/her understanding and acceptance.     Dental advisory given  Plan Discussed with: CRNA and Anesthesiologist  Anesthesia Plan Comments:         Anesthesia Quick Evaluation

## 2018-09-14 ENCOUNTER — Encounter (HOSPITAL_COMMUNITY): Payer: Self-pay | Admitting: Pulmonary Disease

## 2018-09-20 ENCOUNTER — Other Ambulatory Visit: Payer: Self-pay | Admitting: Pulmonary Disease

## 2018-09-20 DIAGNOSIS — R918 Other nonspecific abnormal finding of lung field: Secondary | ICD-10-CM

## 2018-09-20 NOTE — Progress Notes (Signed)
Joanne Griffin, please contact patient regarding bronchoscopy results. The lymph nodes that we sampled returned negative. However, I recommend we repeat PET CT to see if there are any other places we can sample. Depending on your results, I may need to discuss your case with Multidisciplinary Tumor Board on what options we should consider.  Please order PET-CT

## 2018-09-22 ENCOUNTER — Other Ambulatory Visit: Payer: Self-pay

## 2018-09-22 ENCOUNTER — Ambulatory Visit (HOSPITAL_COMMUNITY)
Admission: RE | Admit: 2018-09-22 | Discharge: 2018-09-22 | Disposition: A | Discharge status: Home / Self Care | Payer: Self-pay | Source: Ambulatory Visit | Attending: Pulmonary Disease | Admitting: Pulmonary Disease

## 2018-09-22 DIAGNOSIS — R918 Other nonspecific abnormal finding of lung field: Secondary | ICD-10-CM | POA: Insufficient documentation

## 2018-09-22 LAB — GLUCOSE, CAPILLARY: Glucose-Capillary: 103 mg/dL — ABNORMAL HIGH (ref 70–99)

## 2018-09-22 MED ORDER — FLUDEOXYGLUCOSE F - 18 (FDG) INJECTION
8.7600 | Freq: Once | INTRAVENOUS | Status: AC | PRN
  Administered 2018-09-22: 8.76 via INTRAVENOUS

## 2018-09-22 NOTE — Telephone Encounter (Signed)
Patient emailing in about her swollen lymph nodes. She states she can feel two and last time she was here with Dr. Loanne Drilling, Dr. Loanne Drilling was looking for swollen ones. Wondering if she needs to be concerned. She states she is not having any other respiratory symptoms at this time. She is going for a PET scan today and wants to know more about what this scan is.   Aaron Edelman please advise.

## 2018-09-22 NOTE — Telephone Encounter (Signed)
09/22/2018 1204  Unsure what the patient is asking.  She needs to proceed forward with a PET scan.  Lymph nodes can be swollen for a multitude of other reasons.  The PET scan will scan from her neck down to her thigh.  Wyn Quaker FNP

## 2018-09-30 ENCOUNTER — Telehealth: Payer: Self-pay | Admitting: Pulmonary Disease

## 2018-09-30 DIAGNOSIS — R918 Other nonspecific abnormal finding of lung field: Secondary | ICD-10-CM

## 2018-09-30 NOTE — Telephone Encounter (Signed)
Pulmonary Telephone Encounter  Discussed PET scan with patient. Less activity in previously seen lung nodule and lymph nodes. Recent bronchoscopy negative for malignancy. After discussion, patient wishes to pursue ongoing surveillance with serial imaging. This is reasonable given size of nodule and risk for malignancy if nodule were to enlarge.   Plan: CT Chest without contrast

## 2018-10-02 NOTE — Telephone Encounter (Signed)
Recall appt placed for July 2020 after CT.  Nothing further needed.

## 2018-10-04 ENCOUNTER — Telehealth: Payer: Self-pay | Admitting: *Deleted

## 2018-10-04 NOTE — Telephone Encounter (Signed)
CALLED PATIENT TO INFORM OF FU APPT. ON 03-01-19 @ 11AM, SPOKE WITH PATIENT AND SHE IS AWARE OF THIS APPT.

## 2018-11-08 ENCOUNTER — Telehealth: Payer: Self-pay | Admitting: *Deleted

## 2018-11-08 NOTE — Telephone Encounter (Signed)
Attempted to contact the patient regarding her appt on 6/9. No answer and unable to leave a message, voicemail was full.

## 2018-11-10 ENCOUNTER — Telehealth: Payer: Self-pay | Admitting: *Deleted

## 2018-11-10 NOTE — Telephone Encounter (Signed)
Attempted to contact the patient yesterday and today regarding her 6/9 appt. No answer, voicemail full. Sent a my chart mesage

## 2018-11-16 ENCOUNTER — Telehealth: Payer: Self-pay | Admitting: *Deleted

## 2018-11-16 NOTE — Telephone Encounter (Signed)
Returned the patient's call and moved her appt to 6/2

## 2018-11-27 NOTE — Progress Notes (Deleted)
Consult Note: Gyn-Onc  [place] 33 y.o. female  CC: No chief complaint on file.   HPI: Referring Provider: Dr. Ilda Basset  33 year-old white single female initially seen in consultation at the request of Dr. Aletha Halim and Dr.Carolyn Harraway-Smith regarding management of a newly diagnosed adenocarcinoma of the cervix. The patient reports she is not had Pap smears in the "many years". She presented initially with flank pain to the emergency room on 07/29/2016. She was treated for a UTI and had resolution of her flank pain although she continued to have suprapubic pain. She has had irregular bleeding and a copious discharge for several months. Ultimately she had a Pap smear showing atypical glandular cells and a cervical biopsy on 09/01/2016 revealing invasive adenocarcinoma of the cervix. The patient denies any weight loss or other constitutional symptoms.  CT scan revealed pelvic and super clavicular adenopathy. Patient received radiation therapy with concurrent cisplatin completed in 01/26/2017.  She was last seen by Dr. Fermin Schwab in August 2019.  Interval History:  She underwent bronchoscopy with cytology in March 2020.  The cytology did not reveal any evidence of malignancy.  09/22/18 PET: IMPRESSION: 1. Compared to the prior PET-CT from 01/04/2018, the right upper lobe superior segment pulmonary nodule is slightly larger, but has reduced metabolic activity, current maximum SUV 1.6 and prior maximum SUV 3.0. The nodule is stable in size compared to the recent chest CT of 08/03/2018. 2. Likewise there has been reduction in activity of the lower paratracheal and right hilar lymph nodes compatible with improvement. 3. A small left occipital lymph node is well below background mediastinal blood pool activity and likely benign. 4. 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  She was last seen by Dr. Sondra Come in March 2020 and by Dr. Alvy Bimler in February  2020.  She comes in today for follow-up.  Her Pap smear was negative in August 2019.  Review of Systems: Constitutional: Feels tired with no energy during the day.  Reporting a decrease in frequency of hot flashes since starting Effexor XR.  Denies fever. Skin: No rash, sores, jaundice, itching, or dryness.  Cardiovascular: No chest pain, shortness of breath, or edema  Pulmonary: No cough or wheeze.  Gastro Intestinal: Reporting intermittent lower abdominal soreness.  No nausea, vomiting, constipation, or diarrhea reported. No bright red blood per rectum or change in bowel movement.  Genitourinary: No frequency, urgency, or dysuria.  Denies vaginal bleeding and discharge.  Musculoskeletal: No myalgia, arthralgia, joint swelling or pain.  Neurologic: No weakness, numbness, or change in gait.  Psychology: Reporting "feeling depressed" due to decreased activity and absence from work.  Insomnia reported with two to three hot flashes a night.    Current Meds:  Outpatient Encounter Medications as of 11/28/2018  Medication Sig  . albuterol (PROAIR HFA) 108 (90 Base) MCG/ACT inhaler Inhale 2 puffs into the lungs every 6 (six) hours as needed for wheezing or shortness of breath.   No facility-administered encounter medications on file as of 11/28/2018.     Allergy:  Allergies  Allergen Reactions  . Amoxicillin Hives    Has patient had a PCN reaction causing immediate rash, facial/tongue/throat swelling, SOB or lightheadedness with hypotension: Yes Has patient had a PCN reaction causing severe rash involving mucus membranes or skin necrosis: Unknown Has patient had a PCN reaction that required hospitalization: Unknown Has patient had a PCN reaction occurring within the last 10 years: No If all of the above answers are "NO",  then may proceed with Cephalosporin use.   Marland Kitchen Penicillins Other (See Comments)    UNKNOWN CHILDHOOD REACTION Has patient had a PCN reaction causing immediate rash,  facial/tongue/throat swelling, SOB or lightheadedness with hypotension: Yes Has patient had a PCN reaction causing severe rash involving mucus membranes or skin necrosis: Unknown Has patient had a PCN reaction that required hospitalization: Unknown Has patient had a PCN reaction occurring within the last 10 years: No If all of the above answers are "NO", then may proceed with Cephalosporin use.      Social Hx:   Social History   Socioeconomic History  . Marital status: Single    Spouse name: Not on file  . Number of children: 0  . Years of education: Not on file  . Highest education level: Not on file  Occupational History  . Not on file  Social Needs  . Financial resource strain: Not on file  . Food insecurity:    Worry: Not on file    Inability: Not on file  . Transportation needs:    Medical: Not on file    Non-medical: Not on file  Tobacco Use  . Smoking status: Former Smoker    Packs/day: 1.00    Years: 12.00    Pack years: 12.00    Types: Cigarettes    Last attempt to quit: 08/12/2018    Years since quitting: 0.2  . Smokeless tobacco: Never Used  Substance and Sexual Activity  . Alcohol use: No  . Drug use: Yes    Types: Marijuana    Comment: last use 09/11/2018  . Sexual activity: Yes    Birth control/protection: None  Lifestyle  . Physical activity:    Days per week: Not on file    Minutes per session: Not on file  . Stress: Not on file  Relationships  . Social connections:    Talks on phone: Not on file    Gets together: Not on file    Attends religious service: Not on file    Active member of club or organization: Not on file    Attends meetings of clubs or organizations: Not on file    Relationship status: Not on file  . Intimate partner violence:    Fear of current or ex partner: Not on file    Emotionally abused: Not on file    Physically abused: Not on file    Forced sexual activity: Not on file  Other Topics Concern  . Not on file  Social  History Narrative  . Not on file    Past Surgical Hx:  Past Surgical History:  Procedure Laterality Date  . CYST REMOVAL HAND Right age 12  . IR FLUORO GUIDE PORT INSERTION RIGHT  10/29/2016  . IR REMOVAL TUN ACCESS W/ PORT W/O FL MOD SED  03/15/2017  . IR US GUIDE VASC ACCESS RIGHT  10/29/2016  . TANDEM RING INSERTION N/A 01/04/2017   Procedure: TANDEM RING INSERTION;  Surgeon: Gery Pray, MD;  Location: [facility];  Service: Urology;  Laterality: N/A;  . TANDEM RING INSERTION N/A 01/10/2017   Procedure: TANDEM RING INSERTION;  Surgeon: Gery Pray, MD;  Location: [facility];  Service: Urology;  Laterality: N/A;  . TANDEM RING INSERTION N/A 01/17/2017   Procedure: TANDEM RING INSERTION;  Surgeon: Gery Pray, MD;  Location: [facility];  Service: Urology;  Laterality: N/A;  . TANDEM RING INSERTION N/A 01/24/2017   Procedure: TANDEM RING INSERTION;  Surgeon:  Gery Pray, MD;  Location: [facility];  Service: Urology;  Laterality: N/A;  . TANDEM RING INSERTION N/A 01/26/2017   Procedure: TANDEM RING INSERTION;  Surgeon: Gery Pray, MD;  Location: [facility];  Service: Urology;  Laterality: N/A;  . VIDEO BRONCHOSCOPY WITH ENDOBRONCHIAL ULTRASOUND Right 09/13/2018   Procedure: VIDEO BRONCHOSCOPY WITH ENDOBRONCHIAL ULTRASOUND;  Surgeon: Margaretha Seeds, MD;  Location: [place];  Service: Thoracic;  Laterality: Right;  . WISDOM TOOTH EXTRACTION     times 4    Past Medical Hx:  Past Medical History:  Diagnosis Date  . Anxiety   . Cervical cancer [facility]) oncologist-  dr gorsuch/  dr Sondra Come   dx 09-01-2016-- Stage IB2--  Invasive cervix adenocarcinoma-- completed chemotherapy (11-09-2016 to 12-08-2016) concurrent w/ external beam radiation (completed 12-22-2016))  . Depression   . History of cancer chemotherapy 11-09-2016 to 12-08-2016  . History of radiation therapy 11-09-2016  to 12-22-2016   cervix was treated to  45 Gy, pelvis was treated to 9 Gy, Sclav left was treated to 56 Gy.  Marland Kitchen History of radiation therapy 01/04/17, 01/10/17, 01/17/17, 01/24/17, 01/26/17   cervix was treated to 27.5 Gy in 5 fractions  . Leukopenia due to antineoplastic chemotherapy [facility])     Oncology Hx:    Adenocarcinoma (GYN origin) (Resolved)   09/01/2016 Initial Diagnosis    Adenocarcinoma (GYN origin)     Cervical cancer ([place])   08/26/2016 Pathology Results    Adequacy Reason Satisfactory for evaluation, endocervical/transformation zone component PRESENT. Diagnosis ATYPICAL GLANDULAR CELLS. SEE COMMENT. COMMENT: THERE ARE GROUPS OF ATYPICAL GLANDULAR CELLS WHICH APPEAR TO BE OF ENDOCERVICAL ORIGIN. TISSUE STUDIES, SUCH AS A CURETTAGE, MAY HELP BETTER EVALUATE THE EXTENT AND SEVERITY OF THESE ATYPICAL CELLS.    09/01/2016 Initial Diagnosis    The patient reports she is not had Pap smears in the "many years". She presented initially with flank pain to the emergency room on 07/29/2016. She was treated for a UTI and had resolution of her flank pain although she continued to have suprapubic pain. She has had irregular bleeding and a copious discharge for several months. 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 Microscopic Comment Immunohistochemistry is attempted to determine endocervical vs. endometrial origin. CEA is focally positive, p16 is diffusely strongly positive, ER is weakly positive, and vimentin is positive. This profile is not specific as to origin and clinical correlation is recommended. Dr. Avis Epley has reviewed the case. The case was called to Dr. Ilda Basset on 09/06/2016    10/05/2016 Imaging    Ct abdomen: 4.7 cm heterogeneous enhancing mass in the cervix and lower uterine segment, consistent with known cervical carcinoma. Mild  retroperitoneal lymphadenopathy in the inferior aortocaval space and right common iliac chain, suspicious for metastatic disease.    10/29/2016 Procedure    Ultrasound and fluoroscopically guided right internal jugular single lumen power port catheter insertion. Tip in the SVC/RA junction. Catheter ready for use    11/09/2016 - 01/26/2017 Radiation Therapy    Radiation treatment dates:   11/09/2016 - 12/23/2016 and HDR on 01/04/2017, 01/10/2017, 01/17/2017, 01/24/2017, 01/26/2017  Site/dose:    1) The cervix was treated to 45 Gy in 25 fractions,   2) The pelvis was treated to 9 Gy in 5 fractions as a boost to the suspicious pelvic  nodes. (54 Gy) 3) Sclav-LT was initially prescribed to 60 Gy in 30 fractions, but the patient discontinued at fraction 28 due to severe skin reactions. (56 Gy) 4) The cervix was also treated to 27.5 Gy in 5 fractions of 5.5 Gy.  Beams/energy:   1) Cervix: 3D // Photon 2) pelvic Boost: IMRT // Photon 3) Sclav-Lt: 3D // 6X, 10X Photon 4) HDR Ir-192 // Brachytherapy    11/09/2016 - 12/08/2016 Chemotherapy    She receives weekly cisplatin    03/15/2017 Procedure    Removal of implanted Port-A-Cath utilizing sharp and blunt dissection. The procedure was uncomplicated.    01/04/2018 PET scan    1. New hypermetabolic RIGHT upper lobe pulmonary nodule coupled with new hypermetabolic RIGHT paratracheal lymph node. Differential includes small pulmonary infection with reactive node versus pulmonary metastasis and mediastinal nodal metastasis. As pulmonary lesion was not present 3 months prior infection is a distinct possibility however the intensity of the paratracheal node favors malignancy. Consider short-term follow-up CT with contrast (1 month). If lesion persists or enlarges malignancy is likely. 2. No evidence of abnormal metabolic the uterine cervix. No pelvic adenopathy. 3. No evidence of malignancy in the occipital region.    02/02/2018 Imaging    Stable appearance of  the 6 mm right upper lobe pulmonary nodule with no change in the right paratracheal and precarinal lymph nodes. Given hypermetabolism seen on the PET-CT 1 month ago, continued close attention will be required.    05/03/2018 Imaging    Right upper lobe nodule and mediastinal/right hilar lymph nodes are stable from 02/02/2018. Right upper lobe nodule is new from 09/27/2017. Findings remain worrisome for malignancy. Close attention on follow-up exams is warranted.    08/03/2018 Imaging    IMPRESSION: 1. Stable exam. No change in solid nodule within the right upper lobe compared with 02/02/18. Continued close interval follow-up is advised. 2. There are 2 tiny thin walled cystic nodules within the left apex and right lower lobe. These are nonspecific and may be postinflammatory in etiology. Pulmonary Langerhans cell histiocytosis may present with thin walled cystic lesions and can be seen almost exclusively in young adults with a history of current or previous smoking.     Family Hx:  Family History  Problem Relation Age of Onset  . Diabetes Maternal Grandmother   . Breast cancer Paternal Grandmother     Vitals:  There were no vitals taken for this visit.  Physical Exam:   Assessment/Plan:   Jamile Rekowski A., MD 11/27/2018, 12:22 PM

## 2018-11-28 ENCOUNTER — Inpatient Hospital Stay: Payer: Self-pay | Attending: Gynecology | Admitting: Gynecologic Oncology

## 2018-12-05 ENCOUNTER — Ambulatory Visit: Payer: Self-pay | Admitting: Gynecology

## 2019-01-17 ENCOUNTER — Telehealth: Payer: Self-pay | Admitting: *Deleted

## 2019-01-17 NOTE — Telephone Encounter (Signed)

## 2019-01-18 ENCOUNTER — Ambulatory Visit (INDEPENDENT_AMBULATORY_CARE_PROVIDER_SITE_OTHER)
Admission: RE | Admit: 2019-01-18 | Discharge: 2019-01-18 | Disposition: A | Discharge status: Home / Self Care | Payer: Self-pay | Source: Ambulatory Visit | Attending: Pulmonary Disease | Admitting: Pulmonary Disease

## 2019-01-18 ENCOUNTER — Other Ambulatory Visit: Payer: Self-pay

## 2019-01-18 DIAGNOSIS — R918 Other nonspecific abnormal finding of lung field: Secondary | ICD-10-CM

## 2019-01-18 MED ORDER — IOHEXOL 300 MG/ML  SOLN
80.0000 mL | Freq: Once | INTRAMUSCULAR | Status: AC | PRN
  Administered 2019-01-18: 80 mL via INTRAVENOUS

## 2019-01-29 ENCOUNTER — Encounter: Payer: Self-pay | Admitting: *Deleted

## 2019-01-31 ENCOUNTER — Telehealth: Payer: Self-pay | Admitting: Pulmonary Disease

## 2019-01-31 DIAGNOSIS — R911 Solitary pulmonary nodule: Secondary | ICD-10-CM

## 2019-01-31 NOTE — Telephone Encounter (Signed)
Returned call to pt: Pt received mychart notification and called office back. Pt informed:The lung nodule located in the right upper lobe of your lung is stable in size. Recommend repeat CT Chest without contrast in 6 months with follow-up with me in 6 months in-person or via video visit.  Order placed for CT w/o contrast Recall 6 mos placed JE Nothing further needed.

## 2019-03-01 ENCOUNTER — Other Ambulatory Visit: Payer: Self-pay

## 2019-03-01 ENCOUNTER — Other Ambulatory Visit (HOSPITAL_COMMUNITY)
Admission: RE | Admit: 2019-03-01 | Discharge: 2019-03-01 | Disposition: A | Discharge status: Home / Self Care | Payer: Self-pay | Source: Ambulatory Visit | Attending: Radiation Oncology | Admitting: Radiation Oncology

## 2019-03-01 ENCOUNTER — Ambulatory Visit
Admission: RE | Admit: 2019-03-01 | Discharge: 2019-03-01 | Disposition: A | Discharge status: Home / Self Care | Payer: Self-pay | Source: Ambulatory Visit | Attending: Radiation Oncology | Admitting: Radiation Oncology

## 2019-03-01 ENCOUNTER — Encounter: Payer: Self-pay | Admitting: Radiation Oncology

## 2019-03-01 VITALS — BP 113/89 | HR 76 | Temp 98.5°F | Resp 18 | Ht 62.0 in | Wt 163.4 lb

## 2019-03-01 DIAGNOSIS — C531 Malignant neoplasm of exocervix: Secondary | ICD-10-CM | POA: Insufficient documentation

## 2019-03-01 DIAGNOSIS — Z8541 Personal history of malignant neoplasm of cervix uteri: Secondary | ICD-10-CM | POA: Insufficient documentation

## 2019-03-01 DIAGNOSIS — R590 Localized enlarged lymph nodes: Secondary | ICD-10-CM | POA: Insufficient documentation

## 2019-03-01 DIAGNOSIS — Z923 Personal history of irradiation: Secondary | ICD-10-CM | POA: Insufficient documentation

## 2019-03-01 NOTE — Patient Instructions (Signed)
Coronavirus (COVID-19) Are you at risk?  Are you at risk for the Coronavirus (COVID-19)?  To be considered HIGH RISK for Coronavirus (COVID-19), you have to meet the following criteria:  . Traveled to [place], [place], [place], [place] or [place]; or in the [place] to [place], [place], [place], or [place]; and have fever, cough, and shortness of breath within the last 2 weeks of travel OR . Been in close contact with a person diagnosed with COVID-19 within the last 2 weeks and have fever, cough, and shortness of breath . IF YOU DO NOT MEET THESE CRITERIA, YOU ARE CONSIDERED LOW RISK FOR COVID-19.  What to do if you are HIGH RISK for COVID-19?  . If you are having a medical emergency, call 911. . Seek medical care right away. Before you go to a doctor's office, urgent care or emergency department, call ahead and tell them about your recent travel, contact with someone diagnosed with COVID-19, and your symptoms. You should receive instructions from your physician's office regarding next steps of care.  . When you arrive at healthcare provider, tell the healthcare staff immediately you have returned from visiting [place], [place], [place], [place] or [place]; or traveled in the [place] to [place], [place], [place], or [place]; in the last two weeks or you have been in close contact with a person diagnosed with COVID-19 in the last 2 weeks.   . Tell the health care staff about your symptoms: fever, cough and shortness of breath. . After you have been seen by a medical provider, you will be either: o Tested for (COVID-19) and discharged home on quarantine except to seek medical care if symptoms worsen, and asked to  - Stay home and avoid contact with others until you get your results (4-5 days)  - Avoid travel on public transportation if possible (such as bus, train, or airplane) or o Sent to the Emergency Department by EMS for evaluation, COVID-19 testing, and possible  admission depending on your condition and test results.  What to do if you are LOW RISK for COVID-19?  Reduce your risk of any infection by using the same precautions used for avoiding the common cold or flu:  . Wash your hands often with soap and warm water for at least 20 seconds.  If soap and water are not readily available, use an alcohol-based hand sanitizer with at least 60% alcohol.  . If coughing or sneezing, cover your mouth and nose by coughing or sneezing into the elbow areas of your shirt or coat, into a tissue or into your sleeve (not your hands). . Avoid shaking hands with others and consider head nods or verbal greetings only. . Avoid touching your eyes, nose, or mouth with unwashed hands.  . Avoid close contact with people who are sick. . Avoid places or events with large numbers of people in one location, like concerts or sporting events. . Carefully consider travel plans you have or are making. . If you are planning any travel outside or inside the [place], visit the CDC's Travelers' Health webpage for the latest health notices. . If you have some symptoms but not all symptoms, continue to monitor at home and seek medical attention if your symptoms worsen. . If you are having a medical emergency, call 911.   ADDITIONAL HEALTHCARE OPTIONS FOR PATIENTS  [facility] / e-Visit: [email or web address]         [facility]: [phone]  [facility]: [phone]                   [facility]: [phone]   

## 2019-03-01 NOTE — Progress Notes (Signed)
Radiation Oncology         [phone] ________________________________  Name: Joanne Griffin MRN: [id number]  Date: 03/01/2019  DOB: 10/03/1985  Follow-Up Visit Note  CC: Harvest Dark, PA-C  [name]    ICD-10-CM   1. Malignant neoplasm of exocervix [facility])  C53.1 Cytology - PAP    Diagnosis:   invasive adenocarcinoma of the cervix, clinical stage IB2 adenocarcinoma cervix with radiographically suspicious pelvic lymphadenopathy and left supraclavicular adenopathy  Interval Since Last Radiation: 2 years  11/09/2016 - 12/23/2016 and HDR on 01/04/2017, 01/10/2017, 01/17/2017, 01/24/2017, 01/26/2017. Site/dose: 1) The cervix was treated to 45 Gy in 25 fractions,  2) The pelviswas treated to 9 Gy in 5 fractions as a boost to the suspicious pelvic nodes. (54 Gy) 3) Sclav-LT was initially prescribed to 60 Gy in 30 fractions, but the patient discontinued at fraction 28 due to severe skin reactions. (56 Gy) 4) The cervix was also treated to 27.5 Gy in 5 fractions of 5.5 Gy (brachytherapy)  Narrative:  The patient returns today for routine follow-up. She has not followed up with gynecologic oncology in the interim.  She missed her appointment scheduled for June of this year.  Since her last visit, she underwent restaging PET scan on 09/22/2018, which showed: right upper lobe nodule larger since 12/2017 but has reduced metabolic activity, and is stable since chest CT 07/2018; reduction in activity of the lower paratracheal and right hilar lymph nodes compatible with improvement; a small left occipital lymph node likely benign; chronic bilateral pars defects at L5.  Repeat chest CT on 01/18/2019 for follow up of the right pulmonary nodule showed: no interval change in size or appearance of solid 7 mm RUL nodule; thin-walled RLL cyst less prominent since 07/2018; unchanged left lung apex ground-glass; no new pulmonary nodules.  On review of symptoms, she is without  complaints today. She denies pain, dysuria or hematuria, vaginal bleeding or discharge, rectal bleeding, diarrhea or constipation, abdominal bloating, and nausea or vomiting.                               Allergies:  is allergic to amoxicillin and penicillins.  Meds: Current Outpatient Medications  Medication Sig Dispense Refill  . albuterol (PROAIR HFA) 108 (90 Base) MCG/ACT inhaler Inhale 2 puffs into the lungs every 6 (six) hours as needed for wheezing or shortness of breath. 1 Inhaler 0   No current facility-administered medications for this encounter.     Physical Findings: The patient is in no acute distress. Patient is alert and oriented.  height is 5\' 2"  (1.575 m) and weight is 163 lb 6 oz (74.1 kg). Her temporal temperature is 98.5 F (36.9 C). Her blood pressure is 113/89 and her pulse is 76. Her respiration is 18 and oxygen saturation is 99%.   Lungs are clear to auscultation bilaterally. Heart has regular rate and rhythm. No palpable cervical, supraclavicular, or axillary adenopathy. Abdomen soft, non-tender, normal bowel sounds. On pelvic examination the external genitalia were unremarkable. A speculum exam was performed. There are no mucosal lesions noted in the vaginal vault. Radiation changes noted in the proximal vagina. I was unable to locate the cervical os on exam today due to agglutination (patient has not been using her vaginal dilator). A Pap smear was obtained of the proximal vagina. On bimanual and rectovaginal examination there were no pelvic masses appreciated.   Lab Findings: Lab Results  Component  Value Date   WBC 4.3 09/13/2018   HGB 12.7 09/13/2018   HCT 39.3 09/13/2018   MCV 88.1 09/13/2018   PLT 325 09/13/2018    Radiographic Findings: No results found.  Impression: Clinically, there is no evidence of reccurrence on exam, pap smear pending. I've encouraged the patient to use her vaginal dilator on a regular basis. She reports she has actually  misplaced her vaginal dilator and was given two extra to use today.  Plan:  She will return for routine follow up in radiation oncology in 6 months and in GYN oncology in 3 months. Patient will follow up with Dr. Loanne Drilling and undergo another chest CT scan approximately 6 months out from her previous CT and PET scans to follow-up on the solitary pulmonary nodule.  ____________________________________  Blair Promise, PhD, MD  This document serves as a record of services personally performed by Gery Pray, MD. It was created on his behalf by Wilburn Mylar, a trained medical scribe. The creation of this record is based on the scribe's personal observations and the provider's statements to them. This document has been checked and approved by the attending provider.

## 2019-03-01 NOTE — Progress Notes (Signed)
Pt presents today for f/u with Dr. Sondra Come. Pt denies c/o pain. Pt denies dysuria/hematuria. Pt denies vaginal bleeding/discharge. Pt denies rectal bleeding, diarrhea/constipation. Pt denies abdominal bloating, N/V.  BP 113/89 (BP Location: Left Arm, Patient Position: Sitting)   Pulse 76   Temp 98.5 F (36.9 C) (Temporal)   Resp 18   Ht 5\' 2"  (1.575 m)   Wt 163 lb 6 oz (74.1 kg)   SpO2 99%   BMI 29.88 kg/m   Wt Readings from Last 3 Encounters:  03/01/19 163 lb 6 oz (74.1 kg)  09/13/18 163 lb (73.9 kg)  09/07/18 164 lb (74.4 kg)   Loma Sousa, RN BSN

## 2019-03-08 LAB — CYTOLOGY - PAP
Diagnosis: UNDETERMINED — AB
HPV: DETECTED — AB

## 2019-03-09 ENCOUNTER — Telehealth: Payer: Self-pay | Admitting: Oncology

## 2019-03-09 ENCOUNTER — Encounter: Payer: Self-pay | Admitting: Oncology

## 2019-03-09 NOTE — Telephone Encounter (Signed)
Called Joanne Griffin and advised her of PAP results.  Discussed that we need to schedule and appointment with Dr. Skeet Latch for a colposcopy.  Joanne Griffin said she will be out of town from 10/8 - 10/14.  Scheduled apt for 10/29 and advised her that we will call her back if the appointment needs to be moved up.

## 2019-03-14 ENCOUNTER — Telehealth: Payer: Self-pay | Admitting: Oncology

## 2019-03-14 NOTE — Telephone Encounter (Signed)
Joanne Griffin regarding her message.  She said she has been having lower left pelvic pain for the past 4 days.  She has tried ibuprofen without relief.  She said it is similar to the pain she had when she was first diagnosed with cancer.  She denies having constipation or any urinary symptoms. Discussed that she can either be seen on Monday by Dr. Sondra Come or that she can go the ER to be evaluated.  She said she is going the ER.

## 2019-04-25 NOTE — Progress Notes (Signed)
GYN ONCOLOGY OFFICE VISIT   [place] 33 y.o. female  Chief Complaint  Patient presents with  . 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 Pap 02/2019 ASCUS hrHPV Colpo with bx collected. Unsatisfactory colpo secondary to cervical stenosis.  Unable to dilate the endocervix Bx collected, .  Colitis Seen in the OR with rectal cramping. Rx with Cipro flagyl with improvement.  Reports intermittent cramping  CT Abd/pelvis  Vasomotor Sx Reports insomnia, vaginal discomfort Prempro Rx  Pulmonary nodules CT chest to follow stable nodules.   Plan:  F/U with Dr. Berline Lopes virtual visit in 3 weeks after imaging    Interval history: Patient returns today for scheduled follow-up.  Over the past week she has been seen in the emergency room twice for pelvic pain.  On the second visit she was treated with antibiotics for presumed pelvic inflammatory disease.  (Gonorrhea and Chlamydia cultures were negative) the patient reports that she now has no pain.  She denies any vaginal discharge or bleeding.   Of note, the patient has a pet scan positive lung nodule and paratracheal lymph node.  CT scan a month later showed stability.  The patient is being followed by Dr. Alvy Bimler for this issue..  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    HPI: 33 year-old white single female initially seen in consultation at the request of Dr. Aletha Halim and Dr.Carolyn Harraway-Smith regarding management of a newly diagnosed adenocarcinoma of the cervix. The patient reports she is not had Pap smears in the "many years". She presented initially with flank pain to the emergency room on 07/29/2016. She was treated for a UTI and had resolution of her flank pain although she continued to have suprapubic pain. She has had irregular bleeding and a copious discharge for several months.  Ultimately she had a Pap smear showing atypical glandular cells and a cervical biopsy on 09/01/2016 revealing invasive adenocarcinoma of the cervix. The patient denies any weight loss or other constitutional symptoms.  CT scan revealed pelvic and super clavicular adenopathy. Patient received radiation therapy with concurrent cisplatin completed in 01/26/2017.  Oncology History  Adenocarcinoma (GYN origin) (Resolved)  09/01/2016 Initial Diagnosis   Adenocarcinoma (GYN origin)   Cervical cancer ([place])  08/26/2016 Pathology Results   Adequacy Reason Satisfactory for evaluation, endocervical/transformation zone component PRESENT. Diagnosis ATYPICAL GLANDULAR CELLS. SEE COMMENT. COMMENT: THERE ARE GROUPS OF ATYPICAL GLANDULAR CELLS WHICH APPEAR TO BE OF ENDOCERVICAL ORIGIN. TISSUE STUDIES, SUCH AS A CURETTAGE, MAY HELP BETTER EVALUATE THE EXTENT AND SEVERITY OF THESE ATYPICAL CELLS.   09/01/2016 Initial Diagnosis   The patient reports she is not had Pap smears in the "many years". She presented initially with flank pain to the emergency room on 07/29/2016. She was treated for a UTI and had resolution of her flank pain although she continued to have suprapubic pain. She has had irregular bleeding and a copious discharge for several months. Ultimately she had a Pap smear showing atypical glandular cells and a cervical biopsy on 09/01/2016 revealing invasive adenocarcinoma of the cervix   09/01/2016 Pathology Results   Cervix, biopsy - INVASIVE ADENOCARCINOMA, SEE COMMENT. Microscopic Comment Immunohistochemistry is attempted to determine endocervical vs. endometrial origin. CEA is focally positive, p16 is diffusely strongly positive, ER is weakly positive, and vimentin is positive. This profile is not specific as to origin and clinical correlation is recommended. Dr. Avis Epley has  reviewed the case. The case was called to Dr. Ilda Basset on 09/06/2016   10/05/2016 Imaging   Ct abdomen: 4.7 cm heterogeneous  enhancing mass in the cervix and lower uterine segment, consistent with known cervical carcinoma. Mild retroperitoneal lymphadenopathy in the inferior aortocaval space and right common iliac chain, suspicious for metastatic disease.   10/29/2016 Procedure   Ultrasound and fluoroscopically guided right internal jugular single lumen power port catheter insertion. Tip in the SVC/RA junction. Catheter ready for use   11/09/2016 - 01/26/2017 Radiation Therapy   Radiation treatment dates:   11/09/2016 - 12/23/2016 and HDR on 01/04/2017, 01/10/2017, 01/17/2017, 01/24/2017, 01/26/2017  Site/dose:    1) The cervix was treated to 45 Gy in 25 fractions,   2) The pelvis was treated to 9 Gy in 5 fractions as a boost to the suspicious pelvic  nodes. (54 Gy) 3) Sclav-LT was initially prescribed to 60 Gy in 30 fractions, but the patient discontinued at fraction 28 due to severe skin reactions. (56 Gy) 4) The cervix was also treated to 27.5 Gy in 5 fractions of 5.5 Gy.  Beams/energy:   1) Cervix: 3D // Photon 2) pelvic Boost: IMRT // Photon 3) Sclav-Lt: 3D // 6X, 10X Photon 4) HDR Ir-192 // Brachytherapy   11/09/2016 - 12/08/2016 Chemotherapy   She receives weekly cisplatin   03/15/2017 Procedure   Removal of implanted Port-A-Cath utilizing sharp and blunt dissection. The procedure was uncomplicated.   01/04/2018 PET scan   1. New hypermetabolic RIGHT upper lobe pulmonary nodule coupled with new hypermetabolic RIGHT paratracheal lymph node. Differential includes small pulmonary infection with reactive node versus pulmonary metastasis and mediastinal nodal metastasis. As pulmonary lesion was not present 3 months prior infection is a distinct possibility however the intensity of the paratracheal node favors malignancy. Consider short-term follow-up CT with contrast (1 month). If lesion persists or enlarges malignancy is likely. 2. No evidence of abnormal metabolic the uterine cervix. No pelvic adenopathy. 3. No  evidence of malignancy in the occipital region.   02/02/2018 Imaging   Stable appearance of the 6 mm right upper lobe pulmonary nodule with no change in the right paratracheal and precarinal lymph nodes. Given hypermetabolism seen on the PET-CT 1 month ago, continued close attention will be required.   05/03/2018 Imaging   Right upper lobe nodule and mediastinal/right hilar lymph nodes are stable from 02/02/2018. Right upper lobe nodule is new from 09/27/2017. Findings remain worrisome for malignancy. Close attention on follow-up exams is warranted.   08/03/2018 Imaging   IMPRESSION: 1. Stable exam. No change in solid nodule within the right upper lobe compared with 02/02/18. Continued close interval follow-up is advised. 2. There are 2 tiny thin walled cystic nodules within the left apex and right lower lobe. These are nonspecific and may be postinflammatory in etiology. Pulmonary Langerhans cell histiocytosis may present with thin walled cystic lesions and can be seen almost exclusively in young adults with a history of current or previous smoking.   12/2018 Imaging   CT C/A/P IMPRESSION: 1. No interval change in size or appearance of solid 7 mm right upper lobe pulmonary nodule when compared to prior CT 02/02/2018. Continued close interval follow-up is advised. 2. Thin-walled cyst in the right lower lobe appears less prominent compared to the prior study. 3. Small focal area of ground-glass in the left lung apex is unchanged compared to 02/02/2018. 4. No new pulmonary nodules.   02/2019 Imaging   CT Urogram Mild colitis and cystitis.   02/2019  Miscellaneous   Pap ASCUS +hrHPV    Reports a visit to the emergency room in September for crampy lower abdominal pain.  At that time a CT urogram was collected and demonstrated mild cystitis and mild colitis.  She is treated with Flagyl and Cipro with some improvement.  She however states that there continues to be pelvic midline cramping.  Pap  test was collected in September and returned ASCUS with high risk HPV.  Genotyping is not available.  Also reports difficulty sleeping at night mood lability vaginal discomfort.  Review of Systems: Review of Systems  Constitutional: Negative for appetite change, chills, fatigue and fever.  HENT:  Negative.   Eyes: Negative for icterus.  Respiratory: Negative.   Cardiovascular: Negative.   Gastrointestinal: Positive for abdominal pain. Negative for diarrhea, nausea, rectal pain and vomiting.  Genitourinary: Negative for nocturia, vaginal bleeding and vaginal discharge.   Skin: Negative.   Neurological: Negative for dizziness, extremity weakness, light-headedness and speech difficulty.  Psychiatric/Behavioral: Positive for sleep disturbance.    Vitals: There were no vitals taken for this visit.  Physical Exam: Physical Exam  Constitutional: She appears well-developed and well-nourished. No distress.  Eyes: No scleral icterus.  Cardiovascular: Normal rate.  Respiratory: Effort normal and breath sounds normal. She exhibits no tenderness.  GI: She exhibits no distension and no mass. There is no abdominal tenderness. There is no rebound and no guarding.  Genitourinary:    No vaginal discharge.     Genitourinary Comments: Significant atrophy of the vagina and cervix.   Cervical os not visible an attempt was made with the uterine probe and target was unsuccessful.  Acetic acid was placed within the vagina and mosaicism noted at 3:00.  Biopsies were collected at 3:00 12:00 and 7:00.   Musculoskeletal: Normal range of motion.  Lymphadenopathy:    She has no cervical adenopathy.  Skin: She is not diaphoretic. No pallor.  Psychiatric: She has a normal mood and affect. Her behavior is normal. Judgment and thought content normal.         Allergies  Allergen Reactions  . Amoxicillin Hives    Has patient had a PCN reaction causing immediate rash, facial/tongue/throat swelling, SOB or  lightheadedness with hypotension: Yes Has patient had a PCN reaction causing severe rash involving mucus membranes or skin necrosis: Unknown Has patient had a PCN reaction that required hospitalization: Unknown Has patient had a PCN reaction occurring within the last 10 years: No If all of the above answers are "NO", then may proceed with Cephalosporin use.   Marland Kitchen Penicillins Other (See Comments)    UNKNOWN CHILDHOOD REACTION Has patient had a PCN reaction causing immediate rash, facial/tongue/throat swelling, SOB or lightheadedness with hypotension: Yes Has patient had a PCN reaction causing severe rash involving mucus membranes or skin necrosis: Unknown Has patient had a PCN reaction that required hospitalization: Unknown Has patient had a PCN reaction occurring within the last 10 years: No If all of the above answers are "NO", then may proceed with Cephalosporin use.      Past Medical History:  Diagnosis Date  . Anxiety   . Cervical cancer [facility]) oncologist-  dr gorsuch/  dr Sondra Come   dx 09-01-2016-- Stage IB2--  Invasive cervix adenocarcinoma-- completed chemotherapy (11-09-2016 to 12-08-2016) concurrent w/ external beam radiation (completed 12-22-2016))  . Depression   . History of cancer chemotherapy 11-09-2016 to 12-08-2016  . History of radiation therapy 11-09-2016  to 12-22-2016   cervix was treated to 45 Gy, pelvis  was treated to 9 Gy, Sclav left was treated to 56 Gy.  Marland Kitchen History of radiation therapy 01/04/17, 01/10/17, 01/17/17, 01/24/17, 01/26/17   cervix was treated to 27.5 Gy in 5 fractions  . Leukopenia due to antineoplastic chemotherapy [facility])     Past Surgical History:  Procedure Laterality Date  . CYST REMOVAL HAND Right age 59  . IR FLUORO GUIDE PORT INSERTION RIGHT  10/29/2016  . IR REMOVAL TUN ACCESS W/ PORT W/O FL MOD SED  03/15/2017  . IR US GUIDE VASC ACCESS RIGHT  10/29/2016  . TANDEM RING INSERTION N/A 01/04/2017   Procedure: TANDEM RING INSERTION;  Surgeon: Gery Pray,  MD;  Location: [facility];  Service: Urology;  Laterality: N/A;  . TANDEM RING INSERTION N/A 01/10/2017   Procedure: TANDEM RING INSERTION;  Surgeon: Gery Pray, MD;  Location: [facility];  Service: Urology;  Laterality: N/A;  . TANDEM RING INSERTION N/A 01/17/2017   Procedure: TANDEM RING INSERTION;  Surgeon: Gery Pray, MD;  Location: [facility];  Service: Urology;  Laterality: N/A;  . TANDEM RING INSERTION N/A 01/24/2017   Procedure: TANDEM RING INSERTION;  Surgeon: Gery Pray, MD;  Location: [facility];  Service: Urology;  Laterality: N/A;  . TANDEM RING INSERTION N/A 01/26/2017   Procedure: TANDEM RING INSERTION;  Surgeon: Gery Pray, MD;  Location: [place];  Service: Urology;  Laterality: N/A;  . VIDEO BRONCHOSCOPY WITH ENDOBRONCHIAL ULTRASOUND Right 09/13/2018   Procedure: VIDEO BRONCHOSCOPY WITH ENDOBRONCHIAL ULTRASOUND;  Surgeon: Margaretha Seeds, MD;  Location: [place];  Service: Thoracic;  Laterality: Right;  . WISDOM TOOTH EXTRACTION     times 4    Current Outpatient Medications  Medication Sig Dispense Refill  . albuterol (PROAIR HFA) 108 (90 Base) MCG/ACT inhaler Inhale 2 puffs into the lungs every 6 (six) hours as needed for wheezing or shortness of breath. 1 Inhaler 0   No current facility-administered medications for this visit.     Social History   Socioeconomic History  . Marital status: Single    Spouse name: Not on file  . Number of children: 0  . Years of education: Not on file  . Highest education level: Not on file  Occupational History  . Not on file  Social Needs  . Financial resource strain: Not on file  . Food insecurity    Worry: Not on file    Inability: Not on file  . Transportation needs    Medical: Not on file    Non-medical: Not on file  Tobacco Use  . Smoking status: Former Smoker    Packs/day: 1.00    Years: 12.00    Pack years: 12.00    Types:  Cigarettes    Quit date: 08/12/2018    Years since quitting: 0.7  . Smokeless tobacco: Never Used  Substance and Sexual Activity  . Alcohol use: No  . Drug use: Yes    Types: Marijuana    Comment: last use 09/11/2018  . Sexual activity: Yes    Birth control/protection: None  Lifestyle  . Physical activity    Days per week: Not on file    Minutes per session: Not on file  . Stress: Not on file  Relationships  . Social Herbalist on phone: Not on file    Gets together: Not on file    Attends religious service: Not on file    Active member of club or organization:  Not on file    Attends meetings of clubs or organizations: Not on file    Relationship status: Not on file  . Intimate partner violence    Fear of current or ex partner: Not on file    Emotionally abused: Not on file    Physically abused: Not on file    Forced sexual activity: Not on file  Other Topics Concern  . Not on file  Social History Narrative  . Not on file    Family History  Problem Relation Age of Onset  . Diabetes Maternal Grandmother   . Breast cancer Paternal Grandmother

## 2019-04-26 ENCOUNTER — Other Ambulatory Visit: Payer: Self-pay

## 2019-04-26 ENCOUNTER — Encounter: Payer: Self-pay | Admitting: Radiation Oncology

## 2019-04-26 ENCOUNTER — Inpatient Hospital Stay: Payer: Self-pay | Attending: Gynecologic Oncology | Admitting: Gynecologic Oncology

## 2019-04-26 VITALS — BP 129/86 | HR 91 | Temp 98.0°F | Resp 16 | Ht 62.0 in | Wt 160.9 lb

## 2019-04-26 DIAGNOSIS — R4586 Emotional lability: Secondary | ICD-10-CM | POA: Insufficient documentation

## 2019-04-26 DIAGNOSIS — R102 Pelvic and perineal pain: Secondary | ICD-10-CM | POA: Insufficient documentation

## 2019-04-26 DIAGNOSIS — R232 Flushing: Secondary | ICD-10-CM

## 2019-04-26 DIAGNOSIS — K529 Noninfective gastroenteritis and colitis, unspecified: Secondary | ICD-10-CM | POA: Insufficient documentation

## 2019-04-26 DIAGNOSIS — Z79899 Other long term (current) drug therapy: Secondary | ICD-10-CM | POA: Insufficient documentation

## 2019-04-26 DIAGNOSIS — G4700 Insomnia, unspecified: Secondary | ICD-10-CM | POA: Insufficient documentation

## 2019-04-26 DIAGNOSIS — R87611 Atypical squamous cells cannot exclude high grade squamous intraepithelial lesion on cytologic smear of cervix (ASC-H): Secondary | ICD-10-CM

## 2019-04-26 DIAGNOSIS — F329 Major depressive disorder, single episode, unspecified: Secondary | ICD-10-CM | POA: Insufficient documentation

## 2019-04-26 DIAGNOSIS — Z87891 Personal history of nicotine dependence: Secondary | ICD-10-CM | POA: Insufficient documentation

## 2019-04-26 DIAGNOSIS — R87618 Other abnormal cytological findings on specimens from cervix uteri: Secondary | ICD-10-CM | POA: Insufficient documentation

## 2019-04-26 DIAGNOSIS — R55 Syncope and collapse: Secondary | ICD-10-CM

## 2019-04-26 DIAGNOSIS — R109 Unspecified abdominal pain: Secondary | ICD-10-CM

## 2019-04-26 DIAGNOSIS — Z923 Personal history of irradiation: Secondary | ICD-10-CM | POA: Insufficient documentation

## 2019-04-26 DIAGNOSIS — R911 Solitary pulmonary nodule: Secondary | ICD-10-CM

## 2019-04-26 DIAGNOSIS — R8789 Other abnormal findings in specimens from female genital organs: Secondary | ICD-10-CM | POA: Insufficient documentation

## 2019-04-26 DIAGNOSIS — F418 Other specified anxiety disorders: Secondary | ICD-10-CM

## 2019-04-26 DIAGNOSIS — C530 Malignant neoplasm of endocervix: Secondary | ICD-10-CM

## 2019-04-26 DIAGNOSIS — Z9221 Personal history of antineoplastic chemotherapy: Secondary | ICD-10-CM | POA: Insufficient documentation

## 2019-04-26 MED ORDER — PREMPRO 0.625-5 MG PO TABS: 1.0000 | ORAL_TABLET | Freq: Every day | ORAL | 4 refills | Status: DC

## 2019-04-26 NOTE — Patient Instructions (Addendum)
F/u in 3 months. Plan to have a CT scan of the chest, abdomen, and pelvis with a virtual visit two weeks after.    Menopause and Hormone Replacement Therapy Menopause is a normal time of life when menstrual periods stop completely and the ovaries stop producing the female hormones estrogen and progesterone. This lack of hormones can affect your health and cause undesirable symptoms. Hormone replacement therapy (HRT) can relieve some of those symptoms. What is hormone replacement therapy? HRT is the use of artificial (synthetic) hormones to replace hormones that your body has stopped producing because you have reached menopause. What are my options for HRT?  HRT may consist of the synthetic hormones estrogen and progestin, or it may consist of only estrogen (estrogen-only therapy). You and your health care provider will decide which form of HRT is best for you. If you choose to be on HRT and you have a uterus, estrogen and progestin are usually prescribed. Estrogen-only therapy is used for women who do not have a uterus. Possible options for taking HRT include:  Pills.  Patches.  Gels.  Sprays.  Vaginal cream.  Vaginal rings.  Vaginal inserts. The amount of hormone(s) that you take and how long you take the hormone(s) varies according to your health. It is important to:  Begin HRT with the lowest possible dosage.  Stop HRT as soon as your health care provider tells you to stop.  Work with your health care provider so that you feel informed and comfortable with your decisions. What are the benefits of HRT? HRT can reduce the frequency and severity of menopausal symptoms. Benefits of HRT vary according to the kind of symptoms that you have, how severe they are, and your overall health. HRT may help to improve the following symptoms of menopause:  Hot flashes and night sweats. These are sudden feelings of heat that spread over the face and body. The skin may turn red, like a blush.  Night sweats are hot flashes that happen while you are sleeping or trying to sleep.  Bone loss (osteoporosis). The body loses calcium more quickly after menopause, causing the bones to become weaker. This can increase the risk for bone breaks (fractures).  Vaginal dryness. The lining of the vagina can become thin and dry, which can cause pain during sex or cause infection, burning, or itching.  Urinary tract infections.  Urinary incontinence. This is the inability to control when you pass urine.  Irritability.  Short-term memory problems. What are the risks of HRT? Risks of HRT vary depending on your individual health and medical history. Risks of HRT also depend on whether you receive both estrogen and progestin or you receive estrogen only. HRT may increase the risk of:  Spotting. This is when a small amount of blood leaks from the vagina unexpectedly.  Endometrial cancer. This cancer is in the lining of the uterus (endometrium).  Breast cancer.  Increased density of breast tissue. This can make it harder to find breast cancer on a breast X-ray (mammogram).  Stroke.  Heart disease.  Blood clots.  Gallbladder disease.  Liver disease. Risks of HRT can increase if you have any of the following conditions:  Endometrial cancer.  Liver disease.  Heart disease.  Breast cancer.  History of blood clots.  History of stroke. Follow these instructions at home:  Take over-the-counter and prescription medicines only as told by your health care provider.  Get mammograms, pelvic exams, and medical checkups as often as told by your health  care provider.  Have Pap tests done as often as told by your health care provider. A Pap test is sometimes called a Pap smear. It is a screening test that is used to check for signs of cancer of the cervix and vagina. A Pap test can also identify the presence of infection or precancerous changes. Pap tests may be done: ? Every 3 years, starting  at age 83. ? Every 5 years, starting after age 30, in combination with testing for human papillomavirus (HPV). ? More often or less often depending on other medical conditions you have, your age, and other risk factors.  It is up to you to get the results of your Pap test. Ask your health care provider, or the department that is doing the test, when your results will be ready.  Keep all follow-up visits as told by your health care provider. This is important. Contact a health care provider if you have:  Pain or swelling in your legs.  Shortness of breath.  Chest pain.  Lumps or changes in your breasts or armpits.  Slurred speech.  Pain, burning, or bleeding when you urinate.  Unusual vaginal bleeding.  Dizziness or headaches.  Weakness or numbness in any part of your arms or legs.  Pain in your abdomen. Summary  Menopause is a normal time of life when menstrual periods stop completely and the ovaries stop producing the female hormones estrogen and progesterone.  Hormone replacement therapy (HRT) can relieve some of the symptoms of menopause.  HRT can reduce the frequency and severity of menopausal symptoms.  Risks of HRT vary depending on your individual health and medical history. This information is not intended to replace advice given to you by your health care provider. Make sure you discuss any questions you have with your health care provider. Document Released: 03/13/2003 Document Revised: 02/14/2018 Document Reviewed: 02/14/2018 Elsevier Patient Education  2020 [employer].

## 2019-04-27 ENCOUNTER — Other Ambulatory Visit: Payer: Self-pay | Admitting: Gynecologic Oncology

## 2019-04-27 ENCOUNTER — Telehealth: Payer: Self-pay

## 2019-04-27 DIAGNOSIS — R232 Flushing: Secondary | ICD-10-CM

## 2019-04-27 MED ORDER — ESTRADIOL-NORETHINDRONE ACET 0.5-0.1 MG PO TABS: 1.0000 | ORAL_TABLET | Freq: Every day | ORAL | 6 refills | Status: DC

## 2019-04-27 NOTE — Addendum Note (Signed)
Addended by: Baruch Merl on: 04/27/2019 05:02 PM   Modules accepted: Orders

## 2019-04-27 NOTE — Telephone Encounter (Signed)
Pt.'s cost of Prempro is $600.00 with insurance. Joylene John, NP sent in Estradiol-Norethindrone Acet 0.5-0.1 MG tablet. The monthly cost for this is $76.74. Ms Feeser stated that she could afford this medication. She will pick it up from the pharmacy.

## 2019-04-30 LAB — SURGICAL PATHOLOGY

## 2019-05-04 ENCOUNTER — Other Ambulatory Visit: Payer: Self-pay

## 2019-05-04 ENCOUNTER — Ambulatory Visit (HOSPITAL_COMMUNITY)
Admission: RE | Admit: 2019-05-04 | Discharge: 2019-05-04 | Disposition: A | Discharge status: Home / Self Care | Payer: Self-pay | Source: Ambulatory Visit | Attending: Gynecologic Oncology | Admitting: Gynecologic Oncology

## 2019-05-04 DIAGNOSIS — R109 Unspecified abdominal pain: Secondary | ICD-10-CM | POA: Insufficient documentation

## 2019-05-04 DIAGNOSIS — C530 Malignant neoplasm of endocervix: Secondary | ICD-10-CM | POA: Insufficient documentation

## 2019-05-04 DIAGNOSIS — R911 Solitary pulmonary nodule: Secondary | ICD-10-CM | POA: Insufficient documentation

## 2019-05-04 MED ORDER — SODIUM CHLORIDE (PF) 0.9 % IJ SOLN
INTRAMUSCULAR | Status: AC
  Filled 2019-05-04: qty 50

## 2019-05-04 MED ORDER — IOHEXOL 300 MG/ML  SOLN
100.0000 mL | Freq: Once | INTRAMUSCULAR | Status: AC | PRN
  Administered 2019-05-04: 13:00:00 100 mL via INTRAVENOUS

## 2019-05-07 NOTE — Progress Notes (Signed)
Gynecologic Oncology Return Clinic Visit  05/10/19   Reason for Visit: follow-up recent colposcopy and biopsies, CT imaging in the setting of cervical cancer history  Treatment History: Oncology History  Adenocarcinoma (GYN origin) (Resolved)  09/01/2016 Initial Diagnosis   Adenocarcinoma (GYN origin)   Cervical cancer ([place])  08/26/2016 Initial Diagnosis   The patient reports she is not had Pap smears in the "many years". She presented initially with flank pain to the emergency room on 07/29/2016. She was treated for a UTI and had resolution of her flank pain although she continued to have suprapubic pain. She has had irregular bleeding and a copious discharge for several months.   08/26/2016 Pathology Results   Pap test: Adequacy Reason - Satisfactory for evaluation, endocervical/transformation zone component PRESENT. Diagnosis - ATYPICAL GLANDULAR CELLS. COMMENT: THERE ARE GROUPS OF ATYPICAL GLANDULAR CELLS WHICH APPEAR TO BE OF ENDOCERVICAL ORIGIN. TISSUE STUDIES, SUCH AS A CURETTAGE, MAY HELP BETTER EVALUATE THE EXTENT AND SEVERITY OF THESE ATYPICAL CELLS.   09/01/2016 Pathology Results   Cervix, biopsy - INVASIVE ADENOCARCINOMA, SEE COMMENT. Microscopic Comment Immunohistochemistry is attempted to determine endocervical vs. endometrial origin. CEA is focally positive, p16 is diffusely strongly positive, ER is weakly positive, and vimentin is positive.   10/05/2016 Imaging   CT A/P 4.7 cm heterogeneous enhancing mass in the cervix and lower uterine segment, consistent with known cervical carcinoma. Mild retroperitoneal lymphadenopathy in the inferior aortocaval space and right common iliac chain, suspicious for metastatic disease.   10/29/2016 Procedure   Port placement   11/09/2016 - 01/26/2017 Radiation Therapy   Radiation treatment dates:   11/09/2016 - 12/23/2016 and HDR on 01/04/2017, 01/10/2017, 01/17/2017, 01/24/2017, 01/26/2017 Site/dose:    1) The cervix was treated to 45 Gy in  25 fractions,   2) The pelvis was treated to 9 Gy in 5 fractions as a boost to the suspicious pelvic  nodes. (54 Gy) 3) Sclav-LT was initially prescribed to 60 Gy in 30 fractions, but the patient discontinued at fraction 28 due to severe skin reactions. (56 Gy) 4) The cervix was also treated to 27.5 Gy in 5 fractions of 5.5 Gy. Beams/energy:   1) Cervix: 3D // Photon 2) pelvic Boost: IMRT // Photon 3) Sclav-Lt: 3D // 6X, 10X Photon 4) HDR Ir-192 // Brachytherapy   11/09/2016 - 12/08/2016 Chemotherapy   She receives weekly cisplatin   03/15/2017 Procedure   Removal of implanted Port-A-Cath utilizing sharp and blunt dissection. The procedure was uncomplicated.   01/04/2018 PET scan   1. New hypermetabolic RIGHT upper lobe pulmonary nodule coupled with new hypermetabolic RIGHT paratracheal lymph node. Differential includes small pulmonary infection with reactive node versus pulmonary metastasis and mediastinal nodal metastasis. As pulmonary lesion was not present 3 months prior infection is a distinct possibility however the intensity of the paratracheal node favors malignancy. Consider short-term follow-up CT with contrast (1 month). If lesion persists or enlarges malignancy is likely. 2. No evidence of abnormal metabolic the uterine cervix. No pelvic adenopathy. 3. No evidence of malignancy in the occipital region.   02/02/2018 Imaging   CT chest Stable appearance of the 6 mm right upper lobe pulmonary nodule with no change in the right paratracheal and precarinal lymph nodes. Given hypermetabolism seen on the PET-CT 1 month ago, continued close attention will be required.   05/03/2018 Imaging   CT chest Right upper lobe nodule and mediastinal/right hilar lymph nodes are stable from 02/02/2018. Right upper lobe nodule is new from 09/27/2017. Findings remain worrisome for  malignancy. Close attention on follow-up exams is warranted.   08/03/2018 Imaging   CT chest 1. Stable exam. No change in  solid nodule within the right upper lobe compared with 02/02/18. Continued close interval follow-up is advised. 2. There are 2 tiny thin walled cystic nodules within the left apex and right lower lobe. These are nonspecific and may be postinflammatory in etiology. Pulmonary Langerhans cell histiocytosis may present with thin walled cystic lesions and can be seen almost exclusively in young adults with a history of current or previous smoking.   12/2018 Imaging   CT C/A/P 1. No interval change in size or appearance of solid 7 mm right upper lobe pulmonary nodule when compared to prior CT 02/02/2018. Continued close interval follow-up is advised. 2. Thin-walled cyst in the right lower lobe appears less prominent compared to the prior study. 3. Small focal area of ground-glass in the left lung apex is unchanged compared to 02/02/2018. 4. No new pulmonary nodules.   02/2019 Imaging   CT Urogram Mild colitis and cystitis.   02/2019 Miscellaneous   Pap ASCUS +hrHPV   04/26/2019 Pathology Results   Colpo: CERVIX biopsies, 3 and 12 O'CLOCK: -  Low grade squamous intraepithelial lesion (CIN1, mild dysplasia)  CERVIX, 7 O'CLOCK, BIOPSY:  -  Low grade squamous intraepithelial lesion (CIN1, mild dysplasia)  -  P16 immunohistochemistry supports the diagnosis of low grade  dysplasia.  There was a small focus on the original HE concerning for a  higher-grade lesion. ENDOCERVIX, CURETTAGE:  -  Scant atypical squamous epithelium  -  The curettage sample is minute with a small focus of hyperchromatic  atypical squamous epithelium.  Additional sampling may be warranted.    05/04/2019 Imaging   CT C/A/P: 1. There is wall thickening of the distal rectum as well as a few loops of small bowel within the pelvis. Findings are nonspecific however may be secondary to post radiation changes. Small bowel enteritis or distal colonic colitis are not excluded. 2. Mild interval increase in soft tissue stranding  and fluid within the pelvis which may be secondary to post radiation changes or infectious/inflammatory process. Possibility of localized recurrence/malignant fluid within the pelvis is not excluded. 3. Similar-appearing pulmonary findings including right upper lobe pulmonary nodule and left upper lobe ground-glass nodule. 4. Unchanged precarinal and right hilar lymph nodes.     Interval History: Patient reports no significant change since her visit 2 weeks ago.  She continues to have crampy left-sided abdominal pain especially in relationship to bowel function.  She has difficulty holding her bowel movements and when she gets the urge to go she has to make it to a bathroom very quickly.  She continues to have diarrhea.  She recalls improvement in her symptoms after being treated for colitis in September.  Past Medical/Surgical History: Past Medical History:  Diagnosis Date  . Anxiety   . Cervical cancer [facility]) oncologist-  dr gorsuch/  dr Sondra Come   dx 09-01-2016-- Stage IB2--  Invasive cervix adenocarcinoma-- completed chemotherapy (11-09-2016 to 12-08-2016) concurrent w/ external beam radiation (completed 12-22-2016))  . Depression   . History of cancer chemotherapy 11-09-2016 to 12-08-2016  . History of radiation therapy 11-09-2016  to 12-22-2016   cervix was treated to 45 Gy, pelvis was treated to 9 Gy, Sclav left was treated to 56 Gy.  Marland Kitchen History of radiation therapy 01/04/17, 01/10/17, 01/17/17, 01/24/17, 01/26/17   cervix was treated to 27.5 Gy in 5 fractions  . Leukopenia due to antineoplastic chemotherapy (  [facility])     Past Surgical History:  Procedure Laterality Date  . CYST REMOVAL HAND Right age 44  . IR FLUORO GUIDE PORT INSERTION RIGHT  10/29/2016  . IR REMOVAL TUN ACCESS W/ PORT W/O FL MOD SED  03/15/2017  . IR US GUIDE VASC ACCESS RIGHT  10/29/2016  . TANDEM RING INSERTION N/A 01/04/2017   Procedure: TANDEM RING INSERTION;  Surgeon: Gery Pray, MD;  Location: [facility];  Service: Urology;  Laterality: N/A;  . TANDEM RING INSERTION N/A 01/10/2017   Procedure: TANDEM RING INSERTION;  Surgeon: Gery Pray, MD;  Location: [facility];  Service: Urology;  Laterality: N/A;  . TANDEM RING INSERTION N/A 01/17/2017   Procedure: TANDEM RING INSERTION;  Surgeon: Gery Pray, MD;  Location: [facility];  Service: Urology;  Laterality: N/A;  . TANDEM RING INSERTION N/A 01/24/2017   Procedure: TANDEM RING INSERTION;  Surgeon: Gery Pray, MD;  Location: [facility];  Service: Urology;  Laterality: N/A;  . TANDEM RING INSERTION N/A 01/26/2017   Procedure: TANDEM RING INSERTION;  Surgeon: Gery Pray, MD;  Location: [facility];  Service: Urology;  Laterality: N/A;  . VIDEO BRONCHOSCOPY WITH ENDOBRONCHIAL ULTRASOUND Right 09/13/2018   Procedure: VIDEO BRONCHOSCOPY WITH ENDOBRONCHIAL ULTRASOUND;  Surgeon: Margaretha Seeds, MD;  Location: [facility];  Service: Thoracic;  Laterality: Right;  . WISDOM TOOTH EXTRACTION     times 4    Family History  Problem Relation Age of Onset  . Diabetes Maternal Grandmother   . Breast cancer Paternal Grandmother     Social History   Socioeconomic History  . Marital status: Legally Separated    Spouse name: Not on file  . Number of children: 0  . Years of education: Not on file  . Highest education level: Not on file  Occupational History  . Not on file  Social Needs  . Financial resource strain: Not on file  . Food insecurity    Worry: Not on file    Inability: Not on file  . Transportation needs    Medical: Not on file    Non-medical: Not on file  Tobacco Use  . Smoking status: Former Smoker    Packs/day: 1.00    Years: 12.00    Pack years: 12.00    Types: Cigarettes    Quit date: 08/12/2018    Years since quitting: 0.7  . Smokeless tobacco: Never Used  Substance and Sexual Activity  . Alcohol use: No  . Drug use: Yes    Types: Marijuana    Comment:  last use 09/11/2018  . Sexual activity: Yes    Birth control/protection: None  Lifestyle  . Physical activity    Days per week: Not on file    Minutes per session: Not on file  . Stress: Not on file  Relationships  . Social Herbalist on phone: Not on file    Gets together: Not on file    Attends religious service: Not on file    Active member of club or organization: Not on file    Attends meetings of clubs or organizations: Not on file    Relationship status: Not on file  Other Topics Concern  . Not on file  Social History Narrative  . Not on file    Current Medications:  Current Outpatient Medications:  .  albuterol (PROAIR HFA) 108 (90 Base) MCG/ACT inhaler, Inhale 2 puffs into the lungs every  6 (six) hours as needed for wheezing or shortness of breath., Disp: 1 Inhaler, Rfl: 0 .  ciprofloxacin (CIPRO) 500 MG tablet, Take 1 tablet (500 mg total) by mouth 2 (two) times daily., Disp: 60 tablet, Rfl: 0 .  Estradiol-Norethindrone Acet 0.5-0.1 MG tablet, Take 1 tablet by mouth daily., Disp: 30 tablet, Rfl: 6 .  metroNIDAZOLE (FLAGYL) 500 MG tablet, Take 1 tablet (500 mg total) by mouth 2 (two) times daily., Disp: 60 tablet, Rfl: 0  Review of Symptoms: Positive symptoms as noted in the HPI. Denies appetite changes, fevers, chills or unexplained weight changes.  . Denies hearing loss, neck lumps or masses, mouth sores, ringing in the ears, voice changes, or vision problems. Denies cough, shortness of breath or wheezing. Denies chest pain, leg swelling or palpitations. Denies abdominal distention, pain, blood in stool, nausea or emesis.   Denies pain with dysuria, hematuria, or incontinence. Denies hot flashes, vaginal bleeding or discharge. Denies joint pain, back pain, or muscle pain/cramps. Denies pruritus, rash, or wound. Denies dizziness, difficulty walking, numbness or seizures. Denies swollen glands or lymph nodes, denies easy bruising or bleeding. Denies  anxiety, depression, confusion, or decreased concentration.  Physical Exam: BP 126/78 (BP Location: Left Arm, Patient Position: Sitting)   Pulse 78   Temp 98.3 F (36.8 C) (Temporal)   Resp 18   Ht 5\' 2"  (1.575 m)   Wt 167 lb (75.8 kg)   SpO2 100%   BMI 30.54 kg/m  General: Alert, oriented, no acute distress. HEENT: sclera anicteric. Chest: Unlabored breathing. Extremities: Grossly normal range of motion.  Warm, well perfused.  No edema bilaterally. Skin: No rashes or lesions noted. GU: Normal appearing external genitalia without erythema, excoriation, or lesions.  Speculum exam reveals moderately atrophic vaginal mucosa and cervix.  Given pathology report from recent colposcopy biopsies, several biopsies were taken again from the 7 o'clock position as well as 1 from 11:00 after obtaining verbal consent from the patient.  Additionally an ECC was performed with a Kevorkian curette as well as a Cytobrush.  Hemostasis noted.  Laboratory & Radiologic Studies: Pathology from colpo on 2023/05/02: A. CERVIX, 3 O'CLOCK, BIOPSY:  - Low grade squamous intraepithelial lesion (CIN1, mild dysplasia)  B. CERVIX, 12 O'CLOCK, BIOPSY:  - Low grade squamous intraepithelial lesion (CIN1, mild dysplasia)  C. CERVIX, 7 O'CLOCK, BIOPSY:  - Low grade squamous intraepithelial lesion (CIN1, mild dysplasia)  - See comment  D. ENDOCERVIX, CURETTAGE:  - Scant atypical squamous epithelium  - See comment  COMMENT:  C. P16 immunohistochemistry supports the diagnosis of low grade  dysplasia. There was a small focus on the original HE concerning for a  higher-grade lesion; clinical correlation recommended.  D. The curettage sample is minute with a small focus of hyperchromatic  atypical squamous epithelium. Additional sampling may be warranted.  Is   CT C/A/P on 11/6: 1. There is wall thickening of the distal rectum as well as a few loops of small bowel within the pelvis. Findings are  nonspecific however may be secondary to post radiation changes. Small bowel enteritis or distal colonic colitis are not excluded. 2. Mild interval increase in soft tissue stranding and fluid within the pelvis which may be secondary to post radiation changes or infectious/inflammatory process. Possibility of localized recurrence/malignant fluid within the pelvis is not excluded. 3. Similar-appearing pulmonary findings including right upper lobe pulmonary nodule and left upper lobe ground-glass nodule. 4. Unchanged precarinal and right hilar lymph nodes.  Assessment & Plan: Joanne Griffin  is a 33 y.o. 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  1.  Abdominal pain: We discussed findings on recent CT scan showing some thickening of walls of both the rectum as well as small bowels.  This may be related to post radiation changes or enteritis/colitis.  Given the patient's report of improvement with antibiotics for colitis treatment and then worsening of her symptoms after completing anabiotic's, I discussed with her trial of a longer period of antibiotics to see if we can help improve her fairly bothersome symptoms.  I suspect that the changes seen in the pelvis on CT scan may be related to postradiation changes and/or infectious process.  Depending on her symptoms over the next month as well as biopsy results, we discussed the possibility of obtaining a PET scan to further assess these findings.  2.  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  Most of the biopsy showed low-grade dysplasia although there was concern for a focus of high-grade dysplasia on one biopsy.  Recommended repeat cervical biopsies as well as an ECC to obtain further  sampling.  This was performed today.  We discussed that if further tissue sampling is needed, I think it would be appropriate for biopsy in the outpatient surgical center.  Jeral Pinch, MD  Division of Gynecologic Oncology  Department of Obstetrics and Gynecology  [facility]

## 2019-05-10 ENCOUNTER — Other Ambulatory Visit: Payer: Self-pay

## 2019-05-10 ENCOUNTER — Encounter: Payer: Self-pay | Admitting: Gynecologic Oncology

## 2019-05-10 ENCOUNTER — Inpatient Hospital Stay: Payer: Self-pay | Attending: Gynecologic Oncology | Admitting: Gynecologic Oncology

## 2019-05-10 VITALS — BP 126/78 | HR 78 | Temp 98.3°F | Resp 18 | Ht 62.0 in | Wt 167.0 lb

## 2019-05-10 DIAGNOSIS — C530 Malignant neoplasm of endocervix: Secondary | ICD-10-CM

## 2019-05-10 DIAGNOSIS — K529 Noninfective gastroenteritis and colitis, unspecified: Secondary | ICD-10-CM

## 2019-05-10 DIAGNOSIS — Z9221 Personal history of antineoplastic chemotherapy: Secondary | ICD-10-CM | POA: Insufficient documentation

## 2019-05-10 DIAGNOSIS — Z87891 Personal history of nicotine dependence: Secondary | ICD-10-CM | POA: Insufficient documentation

## 2019-05-10 DIAGNOSIS — Z923 Personal history of irradiation: Secondary | ICD-10-CM | POA: Insufficient documentation

## 2019-05-10 MED ORDER — CIPROFLOXACIN HCL 500 MG PO TABS: 500.0000 mg | ORAL_TABLET | Freq: Two times a day (BID) | ORAL | 0 refills | Status: DC

## 2019-05-10 MED ORDER — METRONIDAZOLE 500 MG PO TABS: 500.0000 mg | ORAL_TABLET | Freq: Two times a day (BID) | ORAL | 0 refills | Status: DC

## 2019-05-10 NOTE — Patient Instructions (Signed)
Dr. Berline Lopes will contact you when the results of the biopsies return.  Plan to take Cipro and Flagyl for one month to treat the colitis seen in your bowels on your recent scan. Avoid alcohol while taking the Flagyl. Please call the office for any questions or concerns.  Cervical Biopsy, Care After This sheet gives you information about how to care for yourself after your procedure. Your health care provider may also give you more specific instructions. If you have problems or questions, contact your health care provider. What can I expect after the procedure? After the procedure, it is common to have:  Cramping or mild pain for a few days.  Slight bleeding from the vagina for a few days.  Dark-colored vaginal discharge for a few days. Follow these instructions at home: Lifestyle  Do not douche until your health care provider approves.  Do not use tampons until your health care provider approves.  Do not have sex until your health care provider approves.  Return to your normal activities as told by your health care provider. Ask your health care provider what activities are safe for you. General instructions   Take over-the-counter and prescription medicines only as told by your health care provider.  Use a sanitary napkin until bleeding and discharge stop.  Keep all follow-up visits as told by your health care provider. This is important. Contact a health care provider if you have:  A fever or chills.  Bad-smelling vaginal discharge.  Itching or irritation around the vagina.  Lower abdominal pain. Get help right away if you:  Develop heavy vaginal bleeding that soaks more than one sanitary napkin an hour.  Faint.  Have severe pain in your lower abdomen. Summary  After the procedure, it is normal to have cramps, slight bleeding, and dark-colored discharge from the vagina.  After you return home, do not douche, have sex, or use a tampon until your health care provider  approves.  Take over-the-counter and prescription medicines only as told by your health care provider.  Get help right away if you develop heavy bleeding, you faint, or you have severe pain in your lower abdomen. This information is not intended to replace advice given to you by your health care provider. Make sure you discuss any questions you have with your health care provider. Document Released: 03/05/2015 Document Revised: 11/17/2017 Document Reviewed: 11/17/2017 Elsevier Patient Education  [employer].  Ciprofloxacin tablets What is this medicine? CIPROFLOXACIN (sip roe FLOX a sin) is a quinolone antibiotic. It is used to treat certain kinds of bacterial infections. It will not work for colds, flu, or other viral infections. This medicine may be used for other purposes; ask your health care provider or pharmacist if you have questions. COMMON BRAND NAME(S): Cipro What should I tell my health care provider before I take this medicine? They need to know if you have any of these conditions:  bone problems  diabetes  heart disease  high blood pressure  history of irregular heartbeat  history of low levels of potassium in the blood  joint problems  kidney disease  liver disease  mental illness  myasthenia gravis  seizures  tendon problems  tingling of the fingers or toes, or other nerve disorder  an unusual or allergic reaction to ciprofloxacin, other antibiotics or medicines, foods, dyes, or preservatives  pregnant or trying to get pregnant  breast-feeding How should I use this medicine? Take this medicine by mouth with a full glass of water. Follow  the directions on the prescription label. You can take it with or without food. If it upsets your stomach, take it with food. Take your medicine at regular intervals. Do not take your medicine more often than directed. Take all of your medicine as directed even if you think you are better. Do not skip doses or  stop your medicine early. Avoid antacids, aluminum, calcium, iron, magnesium, and zinc products for 6 hours before and 2 hours after taking a dose of this medicine. A special MedGuide will be given to you by the pharmacist with each prescription and refill. Be sure to read this information carefully each time. Talk to your pediatrician regarding the use of this medicine in children. Special care may be needed. Overdosage: If you think you have taken too much of this medicine contact a poison control center or emergency room at once. NOTE: This medicine is only for you. Do not share this medicine with others. What if I miss a dose? If you miss a dose, take it as soon as you can. If it is almost time for your next dose, take only that dose. Do not take double or extra doses. What may interact with this medicine? Do not take this medicine with any of the following medications:  cisapride  dronedarone  flibanserin  lomitapide  pimozide  thioridazine  tizanidine This medicine may also interact with the following medications:  antacids  birth control pills  caffeine  certain medicines for diabetes, like glipizide, glyburide, or insulin  certain medicines that treat or prevent blood clots like warfarin  clozapine  cyclosporine  didanosine buffered tablets or powder  dofetilide  duloxetine  lanthanum carbonate  lidocaine  methotrexate  multivitamins  NSAIDS, medicines for pain and inflammation, like ibuprofen or naproxen  olanzapine  omeprazole  other medicines that prolong the QT interval (cause an abnormal heart rhythm)  phenytoin  probenecid  ropinirole  sevelamer  sildenafil  sucralfate  theophylline  ziprasidone  zolpidem This list may not describe all possible interactions. Give your health care provider a list of all the medicines, herbs, non-prescription drugs, or dietary supplements you use. Also tell them if you smoke, drink alcohol, or  use illegal drugs. Some items may interact with your medicine. What should I watch for while using this medicine? Tell your doctor or health care provider if your symptoms do not start to get better or if they get worse. This medicine may cause serious skin reactions. They can happen weeks to months after starting the medicine. Contact your health care provider right away if you notice fevers or flu-like symptoms with a rash. The rash may be red or purple and then turn into blisters or peeling of the skin. Or, you might notice a red rash with swelling of the face, lips or lymph nodes in your neck or under your arms. Do not treat diarrhea with over the counter products. Contact your doctor if you have diarrhea that lasts more than 2 days or if it is severe and watery. Check with your doctor or health care provider if you get an attack of severe diarrhea, nausea and vomiting, or if you sweat a lot. The loss of too much body fluid can make it dangerous for you to take this medicine. This medicine may increase blood sugar. Ask your health care provider if changes in diet or medicines are needed if you have diabetes. You may get drowsy or dizzy. Do not drive, use machinery, or do anything that needs  mental alertness until you know how this medicine affects you. Do not sit or stand up quickly, especially if you are an older patient. This reduces the risk of dizzy or fainting spells. This medicine can make you more sensitive to the sun. Keep out of the sun. If you cannot avoid being in the sun, wear protective clothing and use sunscreen. Do not use sun lamps or tanning beds/booths. What side effects may I notice from receiving this medicine? Side effects that you should report to your doctor or health care professional as soon as possible:  allergic reactions like skin rash or hives, swelling of the face, lips, or tongue  anxious  bloody or watery diarrhea  confusion  depressed mood  fast, irregular  heartbeat  fever  hallucination, loss of contact with reality  joint, muscle, or tendon pain or swelling  loss of memory  pain, tingling, numbness in the hands or feet  redness, blistering, peeling or loosening of the skin, including inside the mouth  seizures  signs and symptoms of aortic dissection such as sudden chest, stomach, or back pain  signs and symptoms of high blood sugar such as being more thirsty or hungry or having to urinate more than normal. You may also feel very tired or have blurry vision.  signs and symptoms of liver injury like dark yellow or brown urine; general ill feeling or flu-like symptoms; light-colored stools; loss of appetite; nausea; right upper belly pain; unusually weak or tired; yellowing of the eyes or skin  signs and symptoms of low blood sugar such as feeling anxious; confusion; dizziness; increased hunger; unusually weak or tired; sweating; shakiness; cold; irritable; headache; blurred vision; fast heartbeat; loss of consciousness; pale skin  suicidal thoughts or other mood changes  sunburn  unusually weak or tired Side effects that usually do not require medical attention (report to your doctor or health care professional if they continue or are bothersome):  dry mouth  headache  nausea  trouble sleeping This list may not describe all possible side effects. Call your doctor for medical advice about side effects. You may report side effects to FDA at 1-800-FDA-1088. Where should I keep my medicine? Keep out of the reach of children. Store at room temperature below 30 degrees C (86 degrees F). Keep container tightly closed. Throw away any unused medicine after the expiration date. NOTE: This sheet is a summary. It may not cover all possible information. If you have questions about this medicine, talk to your doctor, pharmacist, or health care provider.  2020 Elsevier/Gold Standard (2018-09-14 11:26:08)  Metronidazole tablets or  capsules What is this medicine? METRONIDAZOLE (me troe NI da zole) is an antiinfective. It is used to treat certain kinds of bacterial and protozoal infections. It will not work for colds, flu, or other viral infections. This medicine may be used for other purposes; ask your health care provider or pharmacist if you have questions. COMMON BRAND NAME(S): Flagyl What should I tell my health care provider before I take this medicine? They need to know if you have any of these conditions:  [place] syndrome  history of blood diseases, like sickle cell anemia or leukemia  history of yeast infection  if you often drink alcohol  liver disease  an unusual or allergic reaction to metronidazole, nitroimidazoles, or other medicines, foods, dyes, or preservatives  pregnant or trying to get pregnant  breast-feeding How should I use this medicine? Take this medicine by mouth with a full glass of water. Follow  the directions on the prescription label. Take your medicine at regular intervals. Do not take your medicine more often than directed. Take all of your medicine as directed even if you think you are better. Do not skip doses or stop your medicine early. Talk to your pediatrician regarding the use of this medicine in children. Special care may be needed. Overdosage: If you think you have taken too much of this medicine contact a poison control center or emergency room at once. NOTE: This medicine is only for you. Do not share this medicine with others. What if I miss a dose? If you miss a dose, take it as soon as you can. If it is almost time for your next dose, take only that dose. Do not take double or extra doses. What may interact with this medicine? Do not take this medicine with any of the following medications:  alcohol or any product that contains alcohol  cisapride  disulfiram  dronedarone  pimozide  thioridazine This medicine may also interact with the following  medications:  amiodarone  birth control pills  busulfan  carbamazepine  cimetidine  cyclosporine  fluorouracil  lithium  other medicines that prolong the QT interval (cause an abnormal heart rhythm) like dofetilide, ziprasidone  phenobarbital  phenytoin  quinidine  tacrolimus  vecuronium  warfarin This list may not describe all possible interactions. Give your health care provider a list of all the medicines, herbs, non-prescription drugs, or dietary supplements you use. Also tell them if you smoke, drink alcohol, or use illegal drugs. Some items may interact with your medicine. What should I watch for while using this medicine? Tell your doctor or health care professional if your symptoms do not improve or if they get worse. You may get drowsy or dizzy. Do not drive, use machinery, or do anything that needs mental alertness until you know how this medicine affects you. Do not stand or sit up quickly, especially if you are an older patient. This reduces the risk of dizzy or fainting spells. Ask your doctor or health care professional if you should avoid alcohol. Many nonprescription cough and cold products contain alcohol. Metronidazole can cause an unpleasant reaction when taken with alcohol. The reaction includes flushing, headache, nausea, vomiting, sweating, and increased thirst. The reaction can last from 30 minutes to several hours. If you are being treated for a sexually transmitted disease, avoid sexual contact until you have finished your treatment. Your sexual partner may also need treatment. What side effects may I notice from receiving this medicine? Side effects that you should report to your doctor or health care professional as soon as possible:  allergic reactions like skin rash or hives, swelling of the face, lips, or tongue  confusion  fast, irregular heartbeat  fever, chills, sore throat  fever with rash, swollen lymph nodes, or swelling of the  face  pain, tingling, numbness in the hands or feet  redness, blistering, peeling or loosening of the skin, including inside the mouth  seizures  sign and symptoms of liver injury like dark yellow or brown urine; general ill feeling or flu-like symptoms; light colored stools; loss of appetite; nausea; right upper belly pain; unusually weak or tired; yellowing of the eyes or skin  vaginal discharge, itching, or odor in women Side effects that usually do not require medical attention (report to your doctor or health care professional if they continue or are bothersome):  changes in taste  diarrhea  headache  nausea, vomiting  stomach pain  This list may not describe all possible side effects. Call your doctor for medical advice about side effects. You may report side effects to FDA at 1-800-FDA-1088. Where should I keep my medicine? Keep out of the reach of children. Store at room temperature below 25 degrees C (77 degrees F). Protect from light. Keep container tightly closed. Throw away any unused medicine after the expiration date. NOTE: This sheet is a summary. It may not cover all possible information. If you have questions about this medicine, talk to your doctor, pharmacist, or health care provider.  2020 Elsevier/Gold Standard (2018-06-06 06:52:33)

## 2019-05-10 NOTE — Addendum Note (Signed)
Addended by: Joylene John D on: 05/10/2019 02:58 PM   Modules accepted: Orders

## 2019-05-14 ENCOUNTER — Telehealth: Payer: Self-pay | Admitting: Gynecologic Oncology

## 2019-05-14 LAB — SURGICAL PATHOLOGY

## 2019-05-14 NOTE — Telephone Encounter (Signed)
Called the patient to review biopsy results from last week.  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  No concern for high-grade dysplasia.  Patient notes significant improvement in her pain since restarting antibiotics for colitis.  She continues to have fecal urgency.  I will call her in early December to reevaluate her symptoms and to discuss possible PET scan when she completes antibiotic therapy if continued abdominal pain given increase stranding noted in the pelvis. Jeral Pinch MD Gynecologic Oncology

## 2019-07-02 ENCOUNTER — Encounter: Payer: Self-pay | Admitting: Gynecologic Oncology

## 2019-08-09 ENCOUNTER — Telehealth: Payer: Self-pay | Admitting: *Deleted

## 2019-08-09 ENCOUNTER — Other Ambulatory Visit: Payer: Self-pay | Admitting: Gynecologic Oncology

## 2019-08-09 DIAGNOSIS — R1030 Lower abdominal pain, unspecified: Secondary | ICD-10-CM

## 2019-08-09 DIAGNOSIS — R197 Diarrhea, unspecified: Secondary | ICD-10-CM

## 2019-08-09 DIAGNOSIS — R152 Fecal urgency: Secondary | ICD-10-CM

## 2019-08-09 NOTE — Telephone Encounter (Signed)
Pt called to let Dr. Berline Lopes know that her Bowel problems are persisting. Pt requests a GI consult. Dr. Berline Lopes notified and GI consult entered. Pt has been contacted by GI office and has scheduled an appointment.

## 2019-08-14 ENCOUNTER — Ambulatory Visit (INDEPENDENT_AMBULATORY_CARE_PROVIDER_SITE_OTHER)
Admission: RE | Admit: 2019-08-14 | Discharge: 2019-08-14 | Disposition: A | Discharge status: Home / Self Care | Payer: Self-pay | Source: Ambulatory Visit | Attending: Pulmonary Disease | Admitting: Pulmonary Disease

## 2019-08-14 ENCOUNTER — Other Ambulatory Visit: Payer: Self-pay

## 2019-08-14 DIAGNOSIS — R911 Solitary pulmonary nodule: Secondary | ICD-10-CM

## 2019-08-15 ENCOUNTER — Other Ambulatory Visit (INDEPENDENT_AMBULATORY_CARE_PROVIDER_SITE_OTHER): Payer: Self-pay

## 2019-08-15 ENCOUNTER — Ambulatory Visit (INDEPENDENT_AMBULATORY_CARE_PROVIDER_SITE_OTHER): Payer: Self-pay | Admitting: Physician Assistant

## 2019-08-15 ENCOUNTER — Encounter: Payer: Self-pay | Admitting: Physician Assistant

## 2019-08-15 ENCOUNTER — Ambulatory Visit (INDEPENDENT_AMBULATORY_CARE_PROVIDER_SITE_OTHER): Payer: Self-pay | Admitting: Pulmonary Disease

## 2019-08-15 ENCOUNTER — Encounter: Payer: Self-pay | Admitting: Pulmonary Disease

## 2019-08-15 VITALS — BP 118/72 | HR 66 | Temp 97.1°F | Ht 62.0 in | Wt 172.8 lb

## 2019-08-15 VITALS — BP 110/70 | HR 72 | Temp 97.5°F | Ht 62.0 in | Wt 173.0 lb

## 2019-08-15 DIAGNOSIS — R911 Solitary pulmonary nodule: Secondary | ICD-10-CM

## 2019-08-15 DIAGNOSIS — K219 Gastro-esophageal reflux disease without esophagitis: Secondary | ICD-10-CM

## 2019-08-15 DIAGNOSIS — R109 Unspecified abdominal pain: Secondary | ICD-10-CM

## 2019-08-15 DIAGNOSIS — R197 Diarrhea, unspecified: Secondary | ICD-10-CM

## 2019-08-15 LAB — CBC WITH DIFFERENTIAL/PLATELET
Basophils Absolute: 0 10*3/uL (ref 0.0–0.1)
Basophils Relative: 0.4 % (ref 0.0–3.0)
Eosinophils Absolute: 0.1 10*3/uL (ref 0.0–0.7)
Eosinophils Relative: 1.6 % (ref 0.0–5.0)
HCT: 38.9 % (ref 36.0–46.0)
Hemoglobin: 13 g/dL (ref 12.0–15.0)
Lymphocytes Relative: 24.6 % (ref 12.0–46.0)
Lymphs Abs: 1.6 10*3/uL (ref 0.7–4.0)
MCHC: 33.5 g/dL (ref 30.0–36.0)
MCV: 87.4 fl (ref 78.0–100.0)
Monocytes Absolute: 0.4 10*3/uL (ref 0.1–1.0)
Monocytes Relative: 6.5 % (ref 3.0–12.0)
Neutro Abs: 4.3 10*3/uL (ref 1.4–7.7)
Neutrophils Relative %: 66.9 % (ref 43.0–77.0)
Platelets: 296 10*3/uL (ref 150.0–400.0)
RBC: 4.45 Mil/uL (ref 3.87–5.11)
RDW: 13.5 % (ref 11.5–15.5)
WBC: 6.4 10*3/uL (ref 4.0–10.5)

## 2019-08-15 LAB — COMPREHENSIVE METABOLIC PANEL
ALT: 36 U/L — ABNORMAL HIGH (ref 0–35)
AST: 16 U/L (ref 0–37)
Albumin: 4 g/dL (ref 3.5–5.2)
Alkaline Phosphatase: 75 U/L (ref 39–117)
BUN: 11 mg/dL (ref 6–23)
CO2: 28 mEq/L (ref 19–32)
Calcium: 9.3 mg/dL (ref 8.4–10.5)
Chloride: 104 mEq/L (ref 96–112)
Creatinine, Ser: 0.78 mg/dL (ref 0.40–1.20)
GFR: 84.36 mL/min (ref >60.00)
Glucose, Bld: 88 mg/dL (ref 70–99)
Potassium: 3.7 mEq/L (ref 3.5–5.1)
Sodium: 138 mEq/L (ref 135–145)
Total Bilirubin: 0.3 mg/dL (ref 0.2–1.2)
Total Protein: 6.9 g/dL (ref 6.0–8.3)

## 2019-08-15 LAB — TSH: TSH: 0.7 u[IU]/mL (ref 0.35–4.50)

## 2019-08-15 MED ORDER — DICYCLOMINE HCL 10 MG PO CAPS: ORAL_CAPSULE | ORAL | 3 refills | Status: DC

## 2019-08-15 MED ORDER — ALIGN 4 MG PO CAPS: ORAL_CAPSULE | ORAL | Status: DC

## 2019-08-15 MED ORDER — OMEPRAZOLE 40 MG PO CPDR: DELAYED_RELEASE_CAPSULE | ORAL | 3 refills | Status: DC

## 2019-08-15 NOTE — Progress Notes (Signed)
Agree with assessment and plan as outlined.  
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
## 2019-08-15 NOTE — Patient Instructions (Addendum)
If you are age 35 or older, your body mass index should be between 23-30. Your Body mass index is 31.64 kg/m. If this is out of the aforementioned range listed, please consider follow up with your Primary Care Provider.  If you are age 35 or younger, your body mass index should be between 19-25. Your Body mass index is 31.64 kg/m. If this is out of the aformentioned range listed, please consider follow up with your Primary Care Provider.   Your provider has requested that you go to the basement level for lab work before leaving today. Press "B" on the elevator. The lab is located at the first door on the left as you exit the elevator.  We have sent the following medications to your pharmacy for you to pick up at your convenience:  START: Dicyclomine 10mg  one tablet three times daily 20-30 minutes before meals.  START: Omeprazole 40mg  one capsule daily 30-60 minutes before breakfast.  Please purchase the following medications over the counter and take as directed:  You can use Align over-the-counter everyday for 2 months.  Due to recent changes in healthcare laws, you may see the results of your imaging and laboratory studies on MyChart before your provider has had a chance to review them.  We understand that in some cases there may be results that are confusing or concerning to you. Not all laboratory results come back in the same time frame and the provider may be waiting for multiple results in order to interpret others.  Please give [place] 48 hours in order for your provider to thoroughly review all the results before contacting the office for clarification of your results.   You are scheduled for follow up appointment on 09-11-19 at 2:30pm.  Thank you, [place]

## 2019-08-15 NOTE — Patient Instructions (Signed)
Right upper lung nodule, stable Left upper lobe ground glass opacity, stable Obtain CT chest without contrast in 6 month. If stable, no further imaging indicated Follow-up in 6 months with me

## 2019-08-21 ENCOUNTER — Other Ambulatory Visit: Payer: Self-pay

## 2019-08-21 DIAGNOSIS — K219 Gastro-esophageal reflux disease without esophagitis: Secondary | ICD-10-CM

## 2019-08-21 DIAGNOSIS — R197 Diarrhea, unspecified: Secondary | ICD-10-CM

## 2019-08-21 DIAGNOSIS — R109 Unspecified abdominal pain: Secondary | ICD-10-CM

## 2019-08-23 LAB — GASTROINTESTINAL PATHOGEN PANEL PCR
C. difficile Tox A/B, PCR: NOT DETECTED
Campylobacter, PCR: NOT DETECTED
Cryptosporidium, PCR: NOT DETECTED
E coli (ETEC) LT/ST PCR: NOT DETECTED
E coli (STEC) stx1/stx2, PCR: NOT DETECTED
E coli 0157, PCR: NOT DETECTED
Giardia lamblia, PCR: NOT DETECTED
Norovirus, PCR: NOT DETECTED
Rotavirus A, PCR: NOT DETECTED
Salmonella, PCR: NOT DETECTED
Shigella, PCR: NOT DETECTED

## 2019-08-23 LAB — CLOSTRIDIUM DIFFICILE TOXIN B, QUALITATIVE, REAL-TIME PCR: Toxigenic C. Difficile by PCR: NOT DETECTED

## 2019-08-27 ENCOUNTER — Telehealth: Payer: Self-pay

## 2019-08-27 NOTE — Telephone Encounter (Signed)
Scheduled Colonoscopy in the [place] on 09/07/19, patient to arrive at 9:30am. Pre-Visit on 09/03/19 patient to arrive at 9:45am. COVID testing scheduled on 09/05/19 at 10:20am @ [id number]

## 2019-08-30 ENCOUNTER — Other Ambulatory Visit: Payer: Self-pay

## 2019-08-30 ENCOUNTER — Encounter: Payer: Self-pay | Admitting: Radiation Oncology

## 2019-08-30 ENCOUNTER — Ambulatory Visit
Admission: RE | Admit: 2019-08-30 | Discharge: 2019-08-30 | Disposition: A | Discharge status: Home / Self Care | Payer: Self-pay | Source: Ambulatory Visit | Attending: Radiation Oncology | Admitting: Radiation Oncology

## 2019-08-30 VITALS — BP 128/83 | HR 20 | Temp 98.0°F | Resp 20 | Wt 176.8 lb

## 2019-08-30 DIAGNOSIS — Z923 Personal history of irradiation: Secondary | ICD-10-CM | POA: Insufficient documentation

## 2019-08-30 DIAGNOSIS — Z8541 Personal history of malignant neoplasm of cervix uteri: Secondary | ICD-10-CM | POA: Insufficient documentation

## 2019-08-30 DIAGNOSIS — R197 Diarrhea, unspecified: Secondary | ICD-10-CM | POA: Insufficient documentation

## 2019-08-30 DIAGNOSIS — R918 Other nonspecific abnormal finding of lung field: Secondary | ICD-10-CM | POA: Insufficient documentation

## 2019-08-30 DIAGNOSIS — Z79899 Other long term (current) drug therapy: Secondary | ICD-10-CM | POA: Insufficient documentation

## 2019-08-30 DIAGNOSIS — C531 Malignant neoplasm of exocervix: Secondary | ICD-10-CM

## 2019-08-30 NOTE — Progress Notes (Signed)
Ms.  Jasa presents today for f/u with Dr. Sondra Come. Last Pap smear was 03/01/19. Pt denies c/o pain. Pt denies dysuria/hematuria. Pt denies vaginal bleeding/discharge. Pt denies rectal bleeding. Pt does report diarrhea, is seeing GI. Pt denies abdominal bloating. Pt reports occasional nausea without vomiting.   BP 128/83 (BP Location: Left Arm, Patient Position: Sitting, Cuff Size: Normal)   Pulse (!) 20   Temp 98 F (36.7 C)   Resp 20   Wt 176 lb 12.8 oz (80.2 kg)   SpO2 97%   BMI 32.34 kg/m   Wt Readings from Last 3 Encounters:  08/30/19 176 lb 12.8 oz (80.2 kg)  08/15/19 173 lb (78.5 kg)  08/15/19 172 lb 12.8 oz (78.4 kg)   [place]oma Sousa, RN BSN

## 2019-08-30 NOTE — Progress Notes (Signed)
Radiation Oncology         [phone] ________________________________  Name: Joanne Griffin MRN: [id number]  Date: 08/30/2019  DOB: [birth year]  Follow-Up Visit Note  CC: Harvest Dark, PA-C  [name],*    ICD-10-CM   1. Malignant neoplasm of exocervix [facility])  C53.1     Diagnosis:   invasive adenocarcinoma of the cervix, clinical stage IB2 adenocarcinoma cervix with radiographically suspicious pelvic lymphadenopathy and left supraclavicular adenopathy  Interval Since Last Radiation: 2 years, 7 months  11/09/2016 - 12/23/2016 and HDR on 01/04/2017, 01/10/2017, 01/17/2017, 01/24/2017, 01/26/2017. Site/dose: 1) The cervix was treated to 45 Gy in 25 fractions,  2) The pelviswas treated to 9 Gy in 5 fractions as a boost to the suspicious pelvic nodes. (54 Gy) 3) Sclav-LT was initially prescribed to 60 Gy in 30 fractions, but the patient discontinued at fraction 28 due to severe skin reactions. (56 Gy) 4) The cervix was also treated to 27.5 Gy in 5 fractions of 5.5 Gy (brachytherapy)   Narrative:  The patient returns today for routine follow-up.   Pap smear obtained during her last visit with me on 03/01/2019 came back positive for atypical cells and HPV. She returned to Dr. Skeet Latch on 04/26/2019 and underwent cervical biopsies and endocervix curettage. All three cervical biopsies (from 3, 7, and 11 o'clock) showed low grade squamous intraepithelial lesions, and the endocervix showed scant atypical squamous epithelium.  Restaging CT C/A/P performed on 05/04/2019 showed:wall thickening of distal rectum and a few small bowel loops, findings nonspecific; mild interval increase in soft tissue stranding and fluid within pelvis; similar-appearing pulmonary findings; unchanged precarinal and right hilar lymph nodes. She returned for repeat biopsies on 05/10/2019 of the cervix at 7 and 11 o'clock and endocervix curettage. All three biopsies showed low grade squamous  intraepithelial lesions.  On review of symptoms, she denies pain, dysuria or hematuria, vaginal bleeding or discharge, rectal bleeding, constipation, abdominal bloating, and vomiting. She is being worked up by GI for diarrhea. She reports occasional nausea.  Patient was placed on Prilosec by gastroenterology which is helped her gastritis esophageal reflux issues.  Patient's diarrhea and cramping is also been improved with use of Bentyl.                               Allergies:  is allergic to amoxicillin and penicillins.  Meds: Current Outpatient Medications  Medication Sig Dispense Refill  . albuterol (PROAIR HFA) 108 (90 Base) MCG/ACT inhaler Inhale 2 puffs into the lungs every 6 (six) hours as needed for wheezing or shortness of breath. 1 Inhaler 0  . dicyclomine (BENTYL) 10 MG capsule Take 1 tablet three times daily 20-30 minutes before meals 90 capsule 3  . omeprazole (PRILOSEC) 40 MG capsule Take 1 capsule daily 30-60 minutes before breakfast 30 capsule 3  . Probiotic Product (ALIGN) 4 MG CAPS Take 1 capsule daily for 2 months     No current facility-administered medications for this encounter.    Physical Findings: The patient is in no acute distress. Patient is alert and oriented.  weight is 176 lb 12.8 oz (80.2 kg). Her temperature is 98 F (36.7 C). Her blood pressure is 128/83 and her pulse is 20 (abnormal). Her respiration is 20 and oxygen saturation is 97%.   Lungs are clear to auscultation bilaterally. Heart has regular rate and rhythm. No palpable cervical, supraclavicular, or axillary adenopathy. Abdomen soft, non-tender, normal bowel sounds.  On pelvic examination the external genitalia were unremarkable. A speculum exam was performed. There are no mucosal lesions noted in the vaginal vault. Mild Radiation changes noted in the proximal vagina. I was able to locate the cervical os on exam today.  No obvious lesions.  On bimanual and rectovaginal examination there were no pelvic  masses appreciated.  Rectal sphincter tone excellent.   Lab Findings: Lab Results  Component Value Date   WBC 6.4 08/15/2019   HGB 13.0 08/15/2019   HCT 38.9 08/15/2019   MCV 87.4 08/15/2019   PLT 296.0 08/15/2019    Radiographic Findings: CT CHEST WO CONTRAST  Result Date: 08/14/2019 CLINICAL DATA:  Abnormal x-ray. EXAM: CT CHEST WITHOUT CONTRAST TECHNIQUE: Multidetector CT imaging of the chest was performed following the standard protocol without IV contrast. COMPARISON:  CT dated May 04, 2019. FINDINGS: Cardiovascular: The heart size is normal. There is no significant pericardial effusion. No evidence for thoracic aortic aneurysm. Mediastinum/Nodes: --stable mediastinal lymph nodes are noted. Retrosternal soft tissues again noted which may represent residual thymic tissue or reactivation of the thymus in the setting of chemotherapy. --No axillary lymphadenopathy. --No supraclavicular lymphadenopathy. --Normal thyroid gland. --The esophagus is unremarkable Lungs/Pleura: There is a right upper lobe pulmonary nodule measuring approximately 6 mm (axial series 3, image 32). This pulmonary nodule previously measured approximately 7 mm. There is a ground-glass airspace opacity in the left upper lobe measuring approximately 8 mm (axial series 3, image 25). This nodule previously measured approximately the same with remeasured. There is no pneumothorax or pleural effusion. There is some scarring versus atelectasis at the lung bases bilaterally. Upper Abdomen: No acute abnormality. Musculoskeletal: No chest wall abnormality. No acute or significant osseous findings. IMPRESSION: 1. Stable bilateral pulmonary nodules as detailed above. No new pulmonary nodule identified. 2. No acute abnormality detected. Electronically Signed   By: Constance Holster M.D.   On: 08/14/2019 23:31    Impression: Clinically, there is no evidence of reccurrence on exam.  Patient will proceed with colonoscopy later this  month for evaluation of her problems with diarrhea.  She was noted to have some thickening of the distal rectum as well as some of the small bowel loops within the pelvis on most recent pelvic CT scan.  No obvious signs of recurrence.  Recent chest CT scan also shows no suspicious areas within the chest.  Patient does report poor dietary habits and would like consultation with nutrition which will be arranged.  She would like to have this done in person if possible.  Plan:  She will return for routine follow up in radiation oncology in 6 months and in GYN oncology in 3 months with Dr. Berline Lopes.  ____________________________________  Blair Promise, PhD, MD  This document serves as a record of services personally performed by Gery Pray, MD. It was created on his behalf by Wilburn Mylar, a trained medical scribe. The creation of this record is based on the scribe's personal observations and the provider's statements to them. This document has been checked and approved by the attending provider.

## 2019-08-30 NOTE — Patient Instructions (Signed)
Coronavirus (COVID-19) Are you at risk?  Are you at risk for the Coronavirus (COVID-19)?  To be considered HIGH RISK for Coronavirus (COVID-19), you have to meet the following criteria:  . Traveled to [place], [place], [place], [place] or [place]; or in the [place] to [place], [place], [place], or [place]; and have fever, cough, and shortness of breath within the last 2 weeks of travel OR . Been in close contact with a person diagnosed with COVID-19 within the last 2 weeks and have fever, cough, and shortness of breath . IF YOU DO NOT MEET THESE CRITERIA, YOU ARE CONSIDERED LOW RISK FOR COVID-19.  What to do if you are HIGH RISK for COVID-19?  . If you are having a medical emergency, call 911. . Seek medical care right away. Before you go to a doctor's office, urgent care or emergency department, call ahead and tell them about your recent travel, contact with someone diagnosed with COVID-19, and your symptoms. You should receive instructions from your physician's office regarding next steps of care.  . When you arrive at healthcare provider, tell the healthcare staff immediately you have returned from visiting [place], [place], [place], [place] or [place]; or traveled in the [place] to [place], [place], [place], or [place]; in the last two weeks or you have been in close contact with a person diagnosed with COVID-19 in the last 2 weeks.   . Tell the health care staff about your symptoms: fever, cough and shortness of breath. . After you have been seen by a medical provider, you will be either: o Tested for (COVID-19) and discharged home on quarantine except to seek medical care if symptoms worsen, and asked to  - Stay home and avoid contact with others until you get your results (4-5 days)  - Avoid travel on public transportation if possible (such as bus, train, or airplane) or o Sent to the Emergency Department by EMS for evaluation, COVID-19 testing, and possible  admission depending on your condition and test results.  What to do if you are LOW RISK for COVID-19?  Reduce your risk of any infection by using the same precautions used for avoiding the common cold or flu:  . Wash your hands often with soap and warm water for at least 20 seconds.  If soap and water are not readily available, use an alcohol-based hand sanitizer with at least 60% alcohol.  . If coughing or sneezing, cover your mouth and nose by coughing or sneezing into the elbow areas of your shirt or coat, into a tissue or into your sleeve (not your hands). . Avoid shaking hands with others and consider head nods or verbal greetings only. . Avoid touching your eyes, nose, or mouth with unwashed hands.  . Avoid close contact with people who are sick. . Avoid places or events with large numbers of people in one location, like concerts or sporting events. . Carefully consider travel plans you have or are making. . If you are planning any travel outside or inside the [place], visit the CDC's Travelers' Health webpage for the latest health notices. . If you have some symptoms but not all symptoms, continue to monitor at home and seek medical attention if your symptoms worsen. . If you are having a medical emergency, call 911.   ADDITIONAL HEALTHCARE OPTIONS FOR PATIENTS  [place] [facility] / e-Visit: [email or web address]         [facility]: [phone]  [facility]: [phone]                   [facility]: [phone]   

## 2019-08-31 ENCOUNTER — Telehealth: Payer: Self-pay | Admitting: Nutrition

## 2019-08-31 NOTE — Telephone Encounter (Signed)
Scheduled appt per 3/4 sch msg. Was unable to leave a voicemail because pt's voicemail box was full. Mailed appt remider and calender.

## 2019-09-03 ENCOUNTER — Encounter: Payer: Self-pay | Admitting: *Deleted

## 2019-09-03 ENCOUNTER — Other Ambulatory Visit: Payer: Self-pay

## 2019-09-03 ENCOUNTER — Ambulatory Visit (AMBULATORY_SURGERY_CENTER): Payer: Self-pay | Admitting: *Deleted

## 2019-09-03 VITALS — Temp 96.9°F | Ht 62.0 in | Wt 174.0 lb

## 2019-09-03 DIAGNOSIS — R152 Fecal urgency: Secondary | ICD-10-CM

## 2019-09-03 DIAGNOSIS — R197 Diarrhea, unspecified: Secondary | ICD-10-CM

## 2019-09-03 DIAGNOSIS — Z01818 Encounter for other preprocedural examination: Secondary | ICD-10-CM

## 2019-09-03 DIAGNOSIS — R109 Unspecified abdominal pain: Secondary | ICD-10-CM

## 2019-09-03 MED ORDER — PLENVU 140 G PO SOLR: 1.0000 | Freq: Once | ORAL | 0 refills | Status: AC

## 2019-09-03 NOTE — Progress Notes (Signed)
Patient is here in-person for PV. Patient denies any allergies to eggs or soy. Patient denies any problems with anesthesia/sedation. Patient denies any oxygen use at home. Patient denies taking any diet/weight loss medications or blood thinners. Patient is not being treated for MRSA or C-diff. EMMI education assisgned to the patient for the procedure, this was explained and instructions given to patient. COVID-19 screening test is on 3/9 at [facility], the pt is aware.  Patient is aware of our care-partner policy and will wear a mask into building.    plenvu sample give to pt, pt is self-pay!

## 2019-09-04 ENCOUNTER — Other Ambulatory Visit (HOSPITAL_COMMUNITY)
Admission: RE | Admit: 2019-09-04 | Discharge: 2019-09-04 | Disposition: A | Discharge status: Home / Self Care | Payer: HRSA Program | Source: Ambulatory Visit | Attending: Gastroenterology | Admitting: Gastroenterology

## 2019-09-04 DIAGNOSIS — Z01812 Encounter for preprocedural laboratory examination: Secondary | ICD-10-CM | POA: Diagnosis present

## 2019-09-04 DIAGNOSIS — Z20822 Contact with and (suspected) exposure to covid-19: Secondary | ICD-10-CM | POA: Diagnosis not present

## 2019-09-05 LAB — SARS CORONAVIRUS 2 (TAT 6-24 HRS): SARS Coronavirus 2: NEGATIVE

## 2019-09-07 ENCOUNTER — Other Ambulatory Visit: Payer: Self-pay

## 2019-09-07 ENCOUNTER — Encounter: Payer: Self-pay | Admitting: Gastroenterology

## 2019-09-07 ENCOUNTER — Ambulatory Visit (AMBULATORY_SURGERY_CENTER): Payer: Self-pay | Admitting: Gastroenterology

## 2019-09-07 VITALS — BP 111/82 | HR 73 | Temp 97.8°F | Resp 17 | Ht 62.0 in | Wt 174.0 lb

## 2019-09-07 DIAGNOSIS — R197 Diarrhea, unspecified: Secondary | ICD-10-CM

## 2019-09-07 DIAGNOSIS — R109 Unspecified abdominal pain: Secondary | ICD-10-CM

## 2019-09-07 DIAGNOSIS — R152 Fecal urgency: Secondary | ICD-10-CM

## 2019-09-07 MED ORDER — SODIUM CHLORIDE 0.9 % IV SOLN: 500.0000 mL | Freq: Once | INTRAVENOUS | Status: DC

## 2019-09-07 NOTE — Patient Instructions (Signed)
The biopsies taken today have been sent for pathology.  The results can take 1-3 weeks to receive.  When your next colonoscopy should occur will be based on the pathology results.    You may resume your previous diet and medication schedule.  Thank you for allowing us to care for you today!!!   YOU HAD AN ENDOSCOPIC PROCEDURE TODAY AT THE [facility]:   Refer to the procedure report that was given to you for any specific questions about what was found during the examination.  If the procedure report does not answer your questions, please call your gastroenterologist to clarify.  If you requested that your care partner not be given the details of your procedure findings, then the procedure report has been included in a sealed envelope for you to review at your convenience later.  YOU SHOULD EXPECT: Some feelings of bloating in the abdomen. Passage of more gas than usual.  Walking can help get rid of the air that was put into your GI tract during the procedure and reduce the bloating. If you had a lower endoscopy (such as a colonoscopy or flexible sigmoidoscopy) you may notice spotting of blood in your stool or on the toilet paper. If you underwent a bowel prep for your procedure, you may not have a normal bowel movement for a few days.  Please Note:  You might notice some irritation and congestion in your nose or some drainage.  This is from the oxygen used during your procedure.  There is no need for concern and it should clear up in a day or so.  SYMPTOMS TO REPORT IMMEDIATELY:   Following lower endoscopy (colonoscopy or flexible sigmoidoscopy):  Excessive amounts of blood in the stool  Significant tenderness or worsening of abdominal pains  Swelling of the abdomen that is new, acute  Fever of 100F or higher  For urgent or emergent issues, a gastroenterologist can be reached at any hour by calling [phone]. Do not use MyChart messaging for urgent concerns.    DIET:  We  do recommend a small meal at first, but then you may proceed to your regular diet.  Drink plenty of fluids but you should avoid alcoholic beverages for 24 hours.  ACTIVITY:  You should plan to take it easy for the rest of today and you should NOT DRIVE or use heavy machinery until tomorrow (because of the sedation medicines used during the test).    FOLLOW UP: Our staff will call the number listed on your records 48-72 hours following your procedure to check on you and address any questions or concerns that you may have regarding the information given to you following your procedure. If we do not reach you, we will leave a message.  We will attempt to reach you two times.  During this call, we will ask if you have developed any symptoms of COVID 19. If you develop any symptoms (ie: fever, flu-like symptoms, shortness of breath, cough etc.) before then, please call [phone].  If you test positive for Covid 19 in the 2 weeks post procedure, please call and report this information to us.    If any biopsies were taken you will be contacted by phone or by letter within the next 1-3 weeks.  Please call us at [phone] if you have not heard about the biopsies in 3 weeks.    SIGNATURES/CONFIDENTIALITY: You and/or your care partner have signed paperwork which will be entered into your electronic medical record.    These signatures attest to the fact that that the information above on your After Visit Summary has been reviewed and is understood.  Full responsibility of the confidentiality of this discharge information lies with you and/or your care-partner. 

## 2019-09-07 NOTE — Progress Notes (Signed)
Pt's states no medical or surgical changes since previsit or office visit.  Temp- June Vitals- Donna 

## 2019-09-07 NOTE — Op Note (Signed)
[place] Patient Name: Joanne Griffin Procedure Date: 09/07/2019 10:06 AM MRN: [id number] Endoscopist: Remo Lipps P. Havery Moros , MD Age: 34 Referring MD:  Date of Birth: [birth year] Gender: Female Account #: [id number] Procedure:                Colonoscopy Indications:              Chronic diarrhea, abdominal pain Medicines:                Monitored Anesthesia Care Procedure:                Pre-Anesthesia Assessment:                           - Prior to the procedure, a History and Physical                            was performed, and patient medications and                            allergies were reviewed. The patient's tolerance of                            previous anesthesia was also reviewed. The risks                            and benefits of the procedure and the sedation                            options and risks were discussed with the patient.                            All questions were answered, and informed consent                            was obtained. Prior Anticoagulants: The patient has                            taken no previous anticoagulant or antiplatelet                            agents. ASA Grade Assessment: II - A patient with                            mild systemic disease. After reviewing the risks                            and benefits, the patient was deemed in                            satisfactory condition to undergo the procedure.                           After obtaining informed consent, the colonoscope  was passed under direct vision. Throughout the                            procedure, the patient's blood pressure, pulse, and                            oxygen saturations were monitored continuously. The                            Colonoscope was introduced through the anus and                            advanced to the the terminal ileum, with                            identification of the  appendiceal orifice and IC                            valve. The colonoscopy was performed without                            difficulty. The patient tolerated the procedure                            well. The quality of the bowel preparation was                            good. The terminal ileum, ileocecal valve,                            appendiceal orifice, and rectum were photographed. Scope In: 10:16:04 AM Scope Out: 10:31:16 AM Scope Withdrawal Time: 0 hours 13 minutes 28 seconds  Total Procedure Duration: 0 hours 15 minutes 12 seconds  Findings:                 The perianal and digital rectal examinations were                            normal.                           The terminal ileum was mostly normal however                            prominent suspected small lymphangiectasias noted                            in patchy distribution throughout. Biopsies were                            taken with a cold forceps for histology to ensure                            normal.  The exam was otherwise without abnormality on                            direct and retroflexion views. No overt                            inflammation.                           Biopsies for histology were taken with a cold                            forceps from the right colon, left colon and                            transverse colon for evaluation of microscopic                            colitis. Complications:            No immediate complications. Estimated blood loss:                            Minimal. Estimated Blood Loss:     Estimated blood loss was minimal. Impression:               - Suspected benign prominent small                            lymphangiectasias in the terminal ileum. Biopsied                            in light of symptoms to ensure normal.                           - The examination was otherwise normal on direct                            and  retroflexion views.                           - Biopsies were taken with a cold forceps from the                            right colon, left colon and transverse colon for                            evaluation of microscopic colitis. Recommendation:           - Patient has a contact number available for                            emergencies. The signs and symptoms of potential                            delayed complications were discussed with the  patient. Return to normal activities tomorrow.                            Written discharge instructions were provided to the                            patient.                           - Resume previous diet.                           - Continue present medications.                           - Await pathology results with further                            recommendations. Remo Lipps P. Havery Moros, MD 09/07/2019 10:39:42 AM This report has been signed electronically.

## 2019-09-07 NOTE — Progress Notes (Signed)
Called to room to assist during endoscopic procedure.  Patient ID and intended procedure confirmed with present staff. Received instructions for my participation in the procedure from the performing physician.  

## 2019-09-07 NOTE — Progress Notes (Signed)
To PACU VSS Report to RN 

## 2019-09-11 ENCOUNTER — Telehealth: Payer: Self-pay

## 2019-09-11 ENCOUNTER — Ambulatory Visit (INDEPENDENT_AMBULATORY_CARE_PROVIDER_SITE_OTHER): Payer: Self-pay | Admitting: Physician Assistant

## 2019-09-11 ENCOUNTER — Other Ambulatory Visit: Payer: Self-pay

## 2019-09-11 ENCOUNTER — Encounter: Payer: Self-pay | Admitting: Physician Assistant

## 2019-09-11 ENCOUNTER — Encounter: Payer: Self-pay | Admitting: Gastroenterology

## 2019-09-11 VITALS — BP 124/81 | HR 73 | Temp 98.7°F | Ht 62.0 in | Wt 170.4 lb

## 2019-09-11 DIAGNOSIS — K580 Irritable bowel syndrome with diarrhea: Secondary | ICD-10-CM

## 2019-09-11 NOTE — Progress Notes (Signed)
Chief Complaint: Follow-up diarrhea and abdominal cramping  HPI:    [name] is a 34 year old Caucasian female with a past medical history as listed below including cervical cancer status post radiation in 2018, known to Dr. Havery Moros, who returns to clinic today for follow-up of her diarrhea and lower abdominal cramping.    08/15/2019 patient seen in clinic and described that for the past year she had trouble with urgent diarrhea as well as generalized abdominal pain which often woke her from her sleep.  A CT done at an outside hospital 03/14/2019 showed mild distal colonic wall thickening and faint pericolonic haze suggestive of mild colitis.  Patient was started on dicyclomine 10 mg 3 times daily and align.  She was also given omeprazole 40 mg daily.  Labs were ordered including TSH, CBC, CMP and stool studies including a GI pathogen panel and O&P.    08/15/2019 labs returned normal.    08/21/2019 C. difficile and GI pathogen panel were negative/normal.    09/07/2019 colonoscopy with suspected benign prominent small lymphangiectasia is in the terminal ileum, exam otherwise normal.  Biopsies all returned negative/normal.    Today, the patient returns to clinic and explains that ever since cleaning out for her colonoscopy back on Friday she is feeling better.  Does tell me she has been taking her Dicyclomine 10 mg 3 times daily and feels less pain overall as well as less urgency.  Was around her niece and nephew over the weekend and feels like she got a stomach bug from them and had some nausea and vomiting yesterday, but feels much better today.  Has continued on align.  Overall feeling better.    Denies fever, chills, blood in her stool or symptoms that awaken her from sleep.  Past Medical History:  Diagnosis Date   Anxiety    Cervical cancer [facility]) oncologist-  dr gorsuch/  dr Sondra Come   dx 09-01-2016-- Stage IB2--  Invasive cervix adenocarcinoma-- completed chemotherapy (11-09-2016 to  12-08-2016) concurrent w/ external beam radiation (completed 12-22-2016))   Depression    History of cancer chemotherapy 11-09-2016 to 12-08-2016   History of radiation therapy 11-09-2016  to 12-22-2016   cervix was treated to 45 Gy, pelvis was treated to 9 Gy, Sclav left was treated to 56 Gy.   History of radiation therapy 01/04/17, 01/10/17, 01/17/17, 01/24/17, 01/26/17   cervix was treated to 27.5 Gy in 5 fractions   Leukopenia due to antineoplastic chemotherapy [facility])     Past Surgical History:  Procedure Laterality Date   CYST REMOVAL HAND Right age 60   IR FLUORO GUIDE PORT INSERTION RIGHT  10/29/2016   IR REMOVAL TUN ACCESS W/ PORT W/O FL MOD SED  03/15/2017   IR US GUIDE VASC ACCESS RIGHT  10/29/2016   TANDEM RING INSERTION N/A 01/04/2017   Procedure: TANDEM RING INSERTION;  Surgeon: Gery Pray, MD;  Location: [facility];  Service: Urology;  Laterality: N/A;   TANDEM RING INSERTION N/A 01/10/2017   Procedure: TANDEM RING INSERTION;  Surgeon: Gery Pray, MD;  Location: [facility];  Service: Urology;  Laterality: N/A;   TANDEM RING INSERTION N/A 01/17/2017   Procedure: TANDEM RING INSERTION;  Surgeon: Gery Pray, MD;  Location: [facility];  Service: Urology;  Laterality: N/A;   TANDEM RING INSERTION N/A 01/24/2017   Procedure: TANDEM RING INSERTION;  Surgeon: Gery Pray, MD;  Location: [facility];  Service: Urology;  Laterality: N/A;   TANDEM RING  INSERTION N/A 01/26/2017   Procedure: TANDEM RING INSERTION;  Surgeon: Gery Pray, MD;  Location: [facility];  Service: Urology;  Laterality: N/A;   VIDEO BRONCHOSCOPY WITH ENDOBRONCHIAL ULTRASOUND Right 09/13/2018   Procedure: VIDEO BRONCHOSCOPY WITH ENDOBRONCHIAL ULTRASOUND;  Surgeon: Margaretha Seeds, MD;  Location: [facility];  Service: Thoracic;  Laterality: Right;   WISDOM TOOTH EXTRACTION     times 4    Current Outpatient Medications    Medication Sig Dispense Refill   albuterol (PROAIR HFA) 108 (90 Base) MCG/ACT inhaler Inhale 2 puffs into the lungs every 6 (six) hours as needed for wheezing or shortness of breath. 1 Inhaler 0   dicyclomine (BENTYL) 10 MG capsule Take 1 tablet three times daily 20-30 minutes before meals 90 capsule 3   omeprazole (PRILOSEC) 40 MG capsule Take 1 capsule daily 30-60 minutes before breakfast 30 capsule 3   Probiotic Product (ALIGN) 4 MG CAPS Take 1 capsule daily for 2 months     No current facility-administered medications for this visit.    Allergies as of 09/11/2019 - Review Complete 09/11/2019  Allergen Reaction Noted   Amoxicillin Hives 10/25/2016   Penicillins Other (See Comments) 07/22/2011    Family History  Problem Relation Age of Onset   Diabetes Maternal Grandmother    Breast cancer Paternal Grandmother     Social History   Socioeconomic History   Marital status: Legally Separated    Spouse name: Not on file   Number of children: 0   Years of education: Not on file   Highest education level: Not on file  Occupational History   Not on file  Tobacco Use   Smoking status: Former Smoker    Packs/day: 1.00    Years: 12.00    Pack years: 12.00    Types: Cigarettes    Quit date: 08/12/2018    Years since quitting: 1.0   Smokeless tobacco: Never Used  Substance and Sexual Activity   Alcohol use: No   Drug use: Yes    Types: Marijuana    Comment: last use 09/11/2018   Sexual activity: Yes    Birth control/protection: Surgical  Other Topics Concern   Not on file  Social History Narrative   Not on file   Social Determinants of Health   Financial Resource Strain:    Difficulty of Paying Living Expenses:   Food Insecurity:    Worried About Charity fundraiser in the Last Year:    Arboriculturist in the Last Year:   Transportation Needs:    Film/video editor (Medical):    Lack of Transportation (Non-Medical):   Physical Activity:     Days of Exercise per Week:    Minutes of Exercise per Session:   Stress:    Feeling of Stress :   Social Connections:    Frequency of Communication with Friends and Family:    Frequency of Social Gatherings with Friends and Family:    Attends Religious Services:    Active Member of Clubs or Organizations:    Attends Music therapist:    Marital Status:   Intimate Partner Violence:    Fear of Current or Ex-Partner:    Emotionally Abused:    Physically Abused:    Sexually Abused:     Review of Systems:    Constitutional: No weight loss, fever or chills Cardiovascular: No chest pain   Respiratory: No SOB  Gastrointestinal: See HPI and otherwise negative   Physical  Exam:  Vital signs: BP 124/81    Pulse 73    Temp 98.7 F (37.1 C)    Ht 5\' 2"  (1.575 m)    Wt 170 lb 6.4 oz (77.3 kg)    BMI 31.17 kg/m   Constitutional:   Pleasant overweight Caucasian female appears to be in NAD, Well developed, Well nourished, alert and cooperative Respiratory: Respirations even and unlabored. Lungs clear to auscultation bilaterally.   No wheezes, crackles, or rhonchi.  Cardiovascular: Normal S1, S2. No MRG. Regular rate and rhythm. No peripheral edema, cyanosis or pallor.  Gastrointestinal:  Soft, nondistended, nontender. No rebound or guarding. Normal bowel sounds. No appreciable masses or hepatomegaly. Rectal:  Not performed.  Psychiatric: Demonstrates good judgement and reason without abnormal affect or behaviors.  RELEVANT LABS AND IMAGING: CBC    Component Value Date/Time   WBC 6.4 08/15/2019 1450   RBC 4.45 08/15/2019 1450   HGB 13.0 08/15/2019 1450   HGB 11.7 02/17/2017 1240   HCT 38.9 08/15/2019 1450   HCT 35.0 02/17/2017 1240   PLT 296.0 08/15/2019 1450   PLT 246 02/17/2017 1240   MCV 87.4 08/15/2019 1450   MCV 95.1 02/17/2017 1240   MCH 28.5 09/13/2018 1025   MCHC 33.5 08/15/2019 1450   RDW 13.5 08/15/2019 1450   RDW 13.5 02/17/2017 1240    LYMPHSABS 1.6 08/15/2019 1450   LYMPHSABS 0.4 (L) 02/17/2017 1240   MONOABS 0.4 08/15/2019 1450   MONOABS 0.3 02/17/2017 1240   EOSABS 0.1 08/15/2019 1450   EOSABS 0.1 02/17/2017 1240   BASOSABS 0.0 08/15/2019 1450   BASOSABS 0.0 02/17/2017 1240    CMP     Component Value Date/Time   NA 138 08/15/2019 1450   NA 137 02/17/2017 1240   K 3.7 08/15/2019 1450   K 3.6 02/17/2017 1240   CL 104 08/15/2019 1450   CO2 28 08/15/2019 1450   CO2 24 02/17/2017 1240   GLUCOSE 88 08/15/2019 1450   GLUCOSE 121 02/17/2017 1240   BUN 11 08/15/2019 1450   BUN 7.5 02/17/2017 1240   CREATININE 0.78 08/15/2019 1450   CREATININE 0.9 02/17/2017 1240   CALCIUM 9.3 08/15/2019 1450   CALCIUM 9.6 02/17/2017 1240   PROT 6.9 08/15/2019 1450   PROT 7.0 02/17/2017 1240   ALBUMIN 4.0 08/15/2019 1450   ALBUMIN 3.6 02/17/2017 1240   AST 16 08/15/2019 1450   AST 22 02/17/2017 1240   ALT 36 (H) 08/15/2019 1450   ALT 52 02/17/2017 1240   ALKPHOS 75 08/15/2019 1450   ALKPHOS 65 02/17/2017 1240   BILITOT 0.3 08/15/2019 1450   BILITOT 0.36 02/17/2017 1240   GFRNONAA >60 09/13/2018 1025   GFRAA >60 09/13/2018 1025    Assessment: 1.  Diarrhea: Recent GI pathogen panel and C. 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  Lower abdominal cramping: With above 3.  GERD: Improved on Omeprazole 40 mg daily  Plan: 1.  Patient tells me her symptoms are much better using the Dicyclomine 10 mg 3 times daily. 2.  Continue Dicyclomine and Align. 3.  Continue Omeprazole 40 daily. 4.  Patient will follow in clinic with me in 2 to 3 months.  At that time can discuss titration of Dicyclomine.  Ellouise Newer, PA-C [facility] 09/11/2019, 2:45 PM  Cc: Harvest Dark, PA-C

## 2019-09-11 NOTE — Progress Notes (Signed)
Agree with assessment and plan as outlined.  

## 2019-09-11 NOTE — Patient Instructions (Signed)
Continue Dicyclomine and Align.    If you are age 35 or younger, your body mass index should be between 19-25. Your Body mass index is 31.17 kg/m. If this is out of the aformentioned range listed, please consider follow up with your Primary Care Provider.    Thank you for choosing me and [facility].  Dennison Bulla

## 2019-09-11 NOTE — Telephone Encounter (Signed)
Unable to leave message on answering machine mailbox full.

## 2019-09-17 ENCOUNTER — Other Ambulatory Visit: Payer: Self-pay

## 2019-09-17 ENCOUNTER — Telehealth: Payer: Self-pay | Admitting: Nutrition

## 2019-09-17 ENCOUNTER — Inpatient Hospital Stay: Payer: Self-pay | Attending: Gynecologic Oncology | Admitting: Nutrition

## 2019-09-17 NOTE — Progress Notes (Signed)
See telephone note.

## 2019-09-17 NOTE — Telephone Encounter (Signed)
I received consult for patient with past history of cervical cancer.  Consult was placed for poor dietary habits.  Contacted patient by telephone who reports that she has had stomach pain since October 2020 and increased diarrhea.  She is working with a GI doctor.  Provided brief education on strategies for improving diarrhea with dietary choices.  Continue probiotic.  Emailed fact sheets on diarrhea and probiotics to patient's home email address.  Patient should be referred to nutrition and diabetes education services for further GI diet education.

## 2019-12-02 NOTE — Progress Notes (Signed)
Gynecologic Oncology Return Clinic Visit  12/03/19  Reason for Visit: Surveillance in the setting of cervix cancer  Treatment History: Oncology History  Adenocarcinoma (GYN origin) (Resolved)  09/01/2016 Initial Diagnosis   Adenocarcinoma (GYN origin)   Cervical cancer ([place])  08/26/2016 Initial Diagnosis   The patient reports she is not had Pap smears in the "many years". She presented initially with flank pain to the emergency room on 07/29/2016. She was treated for a UTI and had resolution of her flank pain although she continued to have suprapubic pain. She has had irregular bleeding and a copious discharge for several months.   08/26/2016 Pathology Results   Pap test: Adequacy Reason - Satisfactory for evaluation, endocervical/transformation zone component PRESENT. Diagnosis - ATYPICAL GLANDULAR CELLS. COMMENT: THERE ARE GROUPS OF ATYPICAL GLANDULAR CELLS WHICH APPEAR TO BE OF ENDOCERVICAL ORIGIN. TISSUE STUDIES, SUCH AS A CURETTAGE, MAY HELP BETTER EVALUATE THE EXTENT AND SEVERITY OF THESE ATYPICAL CELLS.   09/01/2016 Pathology Results   Cervix, biopsy - INVASIVE ADENOCARCINOMA, SEE COMMENT. Microscopic Comment Immunohistochemistry is attempted to determine endocervical vs. endometrial origin. CEA is focally positive, p16 is diffusely strongly positive, ER is weakly positive, and vimentin is positive.   10/05/2016 Imaging   CT A/P 4.7 cm heterogeneous enhancing mass in the cervix and lower uterine segment, consistent with known cervical carcinoma. Mild retroperitoneal lymphadenopathy in the inferior aortocaval space and right common iliac chain, suspicious for metastatic disease.   10/29/2016 Procedure   Port placement   11/09/2016 - 01/26/2017 Radiation Therapy   Radiation treatment dates:   11/09/2016 - 12/23/2016 and HDR on 01/04/2017, 01/10/2017, 01/17/2017, 01/24/2017, 01/26/2017 Site/dose:    1) The cervix was treated to 45 Gy in 25 fractions,   2) The pelvis was treated to 9 Gy in  5 fractions as a boost to the suspicious pelvic  nodes. (54 Gy) 3) Sclav-LT was initially prescribed to 60 Gy in 30 fractions, but the patient discontinued at fraction 28 due to severe skin reactions. (56 Gy) 4) The cervix was also treated to 27.5 Gy in 5 fractions of 5.5 Gy. Beams/energy:   1) Cervix: 3D // Photon 2) pelvic Boost: IMRT // Photon 3) Sclav-Lt: 3D // 6X, 10X Photon 4) HDR Ir-192 // Brachytherapy   11/09/2016 - 12/08/2016 Chemotherapy   She receives weekly cisplatin   03/15/2017 Procedure   Removal of implanted Port-A-Cath utilizing sharp and blunt dissection. The procedure was uncomplicated.   01/04/2018 PET scan   1. New hypermetabolic RIGHT upper lobe pulmonary nodule coupled with new hypermetabolic RIGHT paratracheal lymph node. Differential includes small pulmonary infection with reactive node versus pulmonary metastasis and mediastinal nodal metastasis. As pulmonary lesion was not present 3 months prior infection is a distinct possibility however the intensity of the paratracheal node favors malignancy. Consider short-term follow-up CT with contrast (1 month). If lesion persists or enlarges malignancy is likely. 2. No evidence of abnormal metabolic the uterine cervix. No pelvic adenopathy. 3. No evidence of malignancy in the occipital region.   02/02/2018 Imaging   CT chest Stable appearance of the 6 mm right upper lobe pulmonary nodule with no change in the right paratracheal and precarinal lymph nodes. Given hypermetabolism seen on the PET-CT 1 month ago, continued close attention will be required.   05/03/2018 Imaging   CT chest Right upper lobe nodule and mediastinal/right hilar lymph nodes are stable from 02/02/2018. Right upper lobe nodule is new from 09/27/2017. Findings remain worrisome for malignancy. Close attention on follow-up exams is warranted.  08/03/2018 Imaging   CT chest 1. Stable exam. No change in solid nodule within the right upper lobe compared with  02/02/18. Continued close interval follow-up is advised. 2. There are 2 tiny thin walled cystic nodules within the left apex and right lower lobe. These are nonspecific and may be postinflammatory in etiology. Pulmonary Langerhans cell histiocytosis may present with thin walled cystic lesions and can be seen almost exclusively in young adults with a history of current or previous smoking.   12/2018 Imaging   CT C/A/P 1. No interval change in size or appearance of solid 7 mm right upper lobe pulmonary nodule when compared to prior CT 02/02/2018. Continued close interval follow-up is advised. 2. Thin-walled cyst in the right lower lobe appears less prominent compared to the prior study. 3. Small focal area of ground-glass in the left lung apex is unchanged compared to 02/02/2018. 4. No new pulmonary nodules.   02/2019 Imaging   CT Urogram Mild colitis and cystitis.   02/2019 Miscellaneous   Pap ASCUS +hrHPV   04/26/2019 Pathology Results   Colpo: CERVIX biopsies, 3 and 12 O'CLOCK: -  Low grade squamous intraepithelial lesion (CIN1, mild dysplasia)  CERVIX, 7 O'CLOCK, BIOPSY:  -  Low grade squamous intraepithelial lesion (CIN1, mild dysplasia)  -  P16 immunohistochemistry supports the diagnosis of low grade  dysplasia.  There was a small focus on the original HE concerning for a  higher-grade lesion. ENDOCERVIX, CURETTAGE:  -  Scant atypical squamous epithelium  -  The curettage sample is minute with a small focus of hyperchromatic  atypical squamous epithelium.  Additional sampling may be warranted.    05/04/2019 Imaging   CT C/A/P: 1. There is wall thickening of the distal rectum as well as a few loops of small bowel within the pelvis. Findings are nonspecific however may be secondary to post radiation changes. Small bowel enteritis or distal colonic colitis are not excluded. 2. Mild interval increase in soft tissue stranding and fluid within the pelvis which may be secondary to  post radiation changes or infectious/inflammatory process. Possibility of localized recurrence/malignant fluid within the pelvis is not excluded. 3. Similar-appearing pulmonary findings including right upper lobe pulmonary nodule and left upper lobe ground-glass nodule. 4. Unchanged precarinal and right hilar lymph nodes.     Interval History: The patient reports overall doing well since her last visit.  She is seeing GI for help with her diarrhea.  She is currently on medication for GERD as well as Bentyl.  When she initially started the Bentyl, she noticed improvement in her pain and diarrhea.  She now has noticed a plateau of her symptoms but overall feels that she is less accidents when it comes to her bowel function.  He denies any vaginal bleeding or discharge.  She denies any changes to her weight.  She occasionally gets lower abdominal pain if she holds her urine too long before voiding.  Patient underwent repeat CT of her chest with no change to pulmonary nodule.  Plan per pulmonologist that she is seen is to repeat a CT scan in 6 months and then discontinue interval imaging if stable.  Had a colonoscopy on 3/12.  Past Medical/Surgical History: Past Medical History:  Diagnosis Date  . Anxiety   . Cervical cancer [facility]) oncologist-  dr gorsuch/  dr Sondra Come   dx 09-01-2016-- Stage IB2--  Invasive cervix adenocarcinoma-- completed chemotherapy (11-09-2016 to 12-08-2016) concurrent w/ external beam radiation (completed 12-22-2016))  . Depression   . History  of cancer chemotherapy 11-09-2016 to 12-08-2016  . History of radiation therapy 11-09-2016  to 12-22-2016   cervix was treated to 45 Gy, pelvis was treated to 9 Gy, Sclav left was treated to 56 Gy.  Marland Kitchen History of radiation therapy 01/04/17, 01/10/17, 01/17/17, 01/24/17, 01/26/17   cervix was treated to 27.5 Gy in 5 fractions  . Leukopenia due to antineoplastic chemotherapy [facility])     Past Surgical History:  Procedure Laterality Date  .  CYST REMOVAL HAND Right age 75  . IR FLUORO GUIDE PORT INSERTION RIGHT  10/29/2016  . IR REMOVAL TUN ACCESS W/ PORT W/O FL MOD SED  03/15/2017  . IR US GUIDE VASC ACCESS RIGHT  10/29/2016  . TANDEM RING INSERTION N/A 01/04/2017   Procedure: TANDEM RING INSERTION;  Surgeon: Gery Pray, MD;  Location: [facility];  Service: Urology;  Laterality: N/A;  . TANDEM RING INSERTION N/A 01/10/2017   Procedure: TANDEM RING INSERTION;  Surgeon: Gery Pray, MD;  Location: [facility];  Service: Urology;  Laterality: N/A;  . TANDEM RING INSERTION N/A 01/17/2017   Procedure: TANDEM RING INSERTION;  Surgeon: Gery Pray, MD;  Location: [facility];  Service: Urology;  Laterality: N/A;  . TANDEM RING INSERTION N/A 01/24/2017   Procedure: TANDEM RING INSERTION;  Surgeon: Gery Pray, MD;  Location: [facility];  Service: Urology;  Laterality: N/A;  . TANDEM RING INSERTION N/A 01/26/2017   Procedure: TANDEM RING INSERTION;  Surgeon: Gery Pray, MD;  Location: [facility];  Service: Urology;  Laterality: N/A;  . VIDEO BRONCHOSCOPY WITH ENDOBRONCHIAL ULTRASOUND Right 09/13/2018   Procedure: VIDEO BRONCHOSCOPY WITH ENDOBRONCHIAL ULTRASOUND;  Surgeon: Margaretha Seeds, MD;  Location: [place];  Service: Thoracic;  Laterality: Right;  . WISDOM TOOTH EXTRACTION     times 4    Family History  Problem Relation Age of Onset  . Diabetes Maternal Grandmother   . Breast cancer Paternal Grandmother     Social History   Socioeconomic History  . Marital status: Legally Separated    Spouse name: Not on file  . Number of children: 0  . Years of education: Not on file  . Highest education level: Not on file  Occupational History  . Not on file  Tobacco Use  . Smoking status: Former Smoker    Packs/day: 1.00    Years: 12.00    Pack years: 12.00    Types: Cigarettes    Quit date: 08/12/2018    Years since quitting: 1.3  . Smokeless tobacco:  Never Used  Substance and Sexual Activity  . Alcohol use: No  . Drug use: Yes    Types: Marijuana    Comment: last use 09/11/2018  . Sexual activity: Yes    Birth control/protection: Surgical  Other Topics Concern  . Not on file  Social History Narrative  . Not on file   Social Determinants of Health   Financial Resource Strain:   . Difficulty of Paying Living Expenses:   Food Insecurity:   . Worried About Charity fundraiser in the Last Year:   . Arboriculturist in the Last Year:   Transportation Needs:   . Film/video editor (Medical):   Marland Kitchen Lack of Transportation (Non-Medical):   Physical Activity:   . Days of Exercise per Week:   . Minutes of Exercise per Session:   Stress:   . Feeling of Stress :   Social Connections:   .  Frequency of Communication with Friends and Family:   . Frequency of Social Gatherings with Friends and Family:   . Attends Religious Services:   . Active Member of Clubs or Organizations:   . Attends Archivist Meetings:   Marland Kitchen Marital Status:     Current Medications:  Current Outpatient Medications:  .  albuterol (PROAIR HFA) 108 (90 Base) MCG/ACT inhaler, Inhale 2 puffs into the lungs every 6 (six) hours as needed for wheezing or shortness of breath., Disp: 1 Inhaler, Rfl: 0 .  dicyclomine (BENTYL) 10 MG capsule, Take 1 tablet three times daily 20-30 minutes before meals, Disp: 90 capsule, Rfl: 3 .  omeprazole (PRILOSEC) 40 MG capsule, Take 1 capsule daily 30-60 minutes before breakfast, Disp: 30 capsule, Rfl: 3 .  Probiotic Product (ALIGN) 4 MG CAPS, Take 1 capsule daily for 2 months, Disp: , Rfl:   Review of Systems: + ringing in ears, diarrhea Denies appetite changes, fevers, chills, fatigue, unexplained weight changes. Denies hearing loss, neck lumps or masses, mouth sores, or voice changes. Denies cough or wheezing.  Denies shortness of breath. Denies chest pain or palpitations. Denies leg swelling. Denies abdominal distention,  pain, blood in stools, constipation, nausea, vomiting, or early satiety. Denies pain with intercourse, dysuria, frequency, hematuria or incontinence. Denies hot flashes, pelvic pain, vaginal bleeding or vaginal discharge.   Denies joint pain, back pain or muscle pain/cramps. Denies itching, rash, or wounds. Denies dizziness, headaches, numbness or seizures. Denies swollen lymph nodes or glands, denies easy bruising or bleeding. Denies anxiety, depression, confusion, or decreased concentration.  Physical Exam: BP 128/74 (BP Location: Left Arm, Patient Position: Sitting)   Pulse 81   Temp (!) 96.6 F (35.9 C) (Tympanic)   Resp 16   Ht 5\' 2"  (1.575 m)   Wt 174 lb (78.9 kg)   SpO2 97%   BMI 31.83 kg/m  General: Alert, oriented, no acute distress. HEENT: Normocephalic, atraumatic, sclera anicteric. Chest: Unlabored breathing on room air. Abdomen: soft, nontender.  Normoactive bowel sounds.  No masses or hepatosplenomegaly appreciated.   Extremities: Grossly normal range of motion.  Warm, well perfused.  No edema bilaterally. Skin: No rashes or lesions noted. Lymphatics: No cervical, supraclavicular, or inguinal adenopathy. GU: Normal appearing external genitalia without erythema, excoriation, or lesions.  Speculum exam reveals atrophic vaginal mucosa with some mild agglutination at the vaginal apex on bilateral sidewalls.  Hypopigmentation of the tissue consistent with radiation changes.  Cervix itself without masses or lesions.  Pap test and HPV collected.  Bimanual exam reveals no cervical masses or nodularity.  Rectovaginal exam confirms these findings.  Laboratory & Radiologic Studies: CT chest 08/14/19: IMPRESSION: 1. Stable bilateral pulmonary nodules as detailed above. No new pulmonary nodule identified. 2. No acute abnormality detected.  Assessment & Plan: Joanne Griffin is a 34 y.o. 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  Patient is doing well and is NED on exam today.  Pap test repeated given low-grade changes seen on Pap and biopsy 6 months ago.  The patient will continue to alternate visits with me and Dr. Sondra Come, radiation oncology.  She is almost 3 years out from completion of treatment.  I think we could transition to 64-month visits per [facility] surveillance recommendations.  I will notify the patient with her Pap test results and we will schedule any follow-up if indicated.  We reviewed signs and symptoms that  be worrisome for disease recurrence and the patient knows to call the clinic if she develops any of these before her next visit.  She has had some improvement in her diarrhea, likely caused by prior radiation treatment.  She will continue to follow with GI for this.  20 minutes of total time was spent for this patient encounter, including preparation, face-to-face counseling with the patient and coordination of care, and documentation of the encounter.  Jeral Pinch, MD  Division of Gynecologic Oncology  Department of Obstetrics and Gynecology  [facility]

## 2019-12-03 ENCOUNTER — Encounter: Payer: Self-pay | Admitting: Gynecologic Oncology

## 2019-12-03 ENCOUNTER — Other Ambulatory Visit: Payer: Self-pay

## 2019-12-03 ENCOUNTER — Inpatient Hospital Stay: Payer: Self-pay | Attending: Gynecologic Oncology | Admitting: Gynecologic Oncology

## 2019-12-03 ENCOUNTER — Other Ambulatory Visit (HOSPITAL_COMMUNITY)
Admission: RE | Admit: 2019-12-03 | Discharge: 2019-12-03 | Disposition: A | Discharge status: Home / Self Care | Payer: Self-pay | Source: Ambulatory Visit | Attending: Gynecologic Oncology | Admitting: Gynecologic Oncology

## 2019-12-03 VITALS — BP 128/74 | HR 81 | Temp 96.6°F | Resp 16 | Ht 62.0 in | Wt 174.0 lb

## 2019-12-03 DIAGNOSIS — R197 Diarrhea, unspecified: Secondary | ICD-10-CM | POA: Insufficient documentation

## 2019-12-03 DIAGNOSIS — C531 Malignant neoplasm of exocervix: Secondary | ICD-10-CM

## 2019-12-03 DIAGNOSIS — Z923 Personal history of irradiation: Secondary | ICD-10-CM | POA: Insufficient documentation

## 2019-12-03 DIAGNOSIS — K219 Gastro-esophageal reflux disease without esophagitis: Secondary | ICD-10-CM | POA: Insufficient documentation

## 2019-12-03 DIAGNOSIS — Z9221 Personal history of antineoplastic chemotherapy: Secondary | ICD-10-CM | POA: Insufficient documentation

## 2019-12-03 DIAGNOSIS — C539 Malignant neoplasm of cervix uteri, unspecified: Secondary | ICD-10-CM | POA: Insufficient documentation

## 2019-12-03 NOTE — Patient Instructions (Signed)
I am happy to hear that your diarrhea has improved some.  Everything on your exam looks great.  I will released your results of the Pap test from today on my chart.  If you need anything before your next visit with me, please call [phone].

## 2019-12-13 ENCOUNTER — Telehealth: Payer: Self-pay

## 2019-12-13 NOTE — Telephone Encounter (Signed)
Requested that HPV High Risk be added to Joanne Griffin Pap Smear done 12-03-19 per Joylene John, NP.

## 2019-12-14 LAB — CYTOLOGY - PAP
Comment: NEGATIVE
Diagnosis: NEGATIVE
High risk HPV: NEGATIVE

## 2019-12-18 NOTE — Telephone Encounter (Signed)
Results in chart 12-17-19 and given to Dr. Berline Lopes to review.

## 2020-02-05 ENCOUNTER — Inpatient Hospital Stay: Admission: RE | Admit: 2020-02-05 | Payer: Self-pay | Source: Ambulatory Visit

## 2020-02-06 ENCOUNTER — Ambulatory Visit (INDEPENDENT_AMBULATORY_CARE_PROVIDER_SITE_OTHER)
Admission: RE | Admit: 2020-02-06 | Discharge: 2020-02-06 | Disposition: A | Discharge status: Home / Self Care | Payer: Self-pay | Source: Ambulatory Visit | Attending: Pulmonary Disease | Admitting: Pulmonary Disease

## 2020-02-06 ENCOUNTER — Other Ambulatory Visit: Payer: Self-pay

## 2020-02-06 DIAGNOSIS — R911 Solitary pulmonary nodule: Secondary | ICD-10-CM

## 2020-02-12 NOTE — Progress Notes (Signed)
Attempted to call patient which went straight to voicemail but I was unable to leave any message due to mailbox being full.

## 2020-03-06 ENCOUNTER — Ambulatory Visit: Payer: Self-pay | Admitting: Radiation Oncology

## 2020-03-17 ENCOUNTER — Ambulatory Visit
Admission: RE | Admit: 2020-03-17 | Discharge: 2020-03-17 | Disposition: A | Discharge status: Home / Self Care | Payer: Self-pay | Source: Ambulatory Visit | Attending: Radiation Oncology | Admitting: Radiation Oncology

## 2020-03-17 ENCOUNTER — Encounter: Payer: Self-pay | Admitting: Radiation Oncology

## 2020-03-17 ENCOUNTER — Other Ambulatory Visit: Payer: Self-pay

## 2020-03-17 DIAGNOSIS — C530 Malignant neoplasm of endocervix: Secondary | ICD-10-CM

## 2020-03-17 DIAGNOSIS — Z923 Personal history of irradiation: Secondary | ICD-10-CM | POA: Insufficient documentation

## 2020-03-17 DIAGNOSIS — Z8541 Personal history of malignant neoplasm of cervix uteri: Secondary | ICD-10-CM | POA: Insufficient documentation

## 2020-03-17 DIAGNOSIS — Z79899 Other long term (current) drug therapy: Secondary | ICD-10-CM | POA: Insufficient documentation

## 2020-03-17 NOTE — Progress Notes (Signed)
Radiation Oncology         [phone] ________________________________  Name: Joanne Griffin MRN: [id number]  Date: 03/17/2020  DOB: 25-Mar-1986  Follow-Up Visit Note  CC: Harvest Dark, PA-C  [name]  No diagnosis found.  Diagnosis: Invasive adenocarcinoma of the cervix, clinical stage IB2 adenocarcinoma cervix with radiographically suspicious pelvic lymphadenopathy and left supraclavicular adenopathy  Interval Since Last Radiation: Three years, one month, two weeks, and five days.  11/09/2016 - 12/23/2016 and HDR on 01/04/2017, 01/10/2017, 01/17/2017, 01/24/2017, 01/26/2017  Site/dose: 1) The cervix was treated to 45 Gy in 25 fractions 2) The pelviswas treated to 9 Gy in 5 fractions as a boost to the suspicious pelvic nodes (54 Gy) 3) Sclav-LT was initially prescribed to 60 Gy in 30 fractions, but the patient discontinued at fraction 28 due to severe skin reactions (56 Gy) 4) The cervix was also treated to 27.5 Gy in 5 fractions of 5.5 Gy (brachytherapy)  Narrative:  The patient returns today for routine follow-up. Since her last visit, she underwent a colonoscopy on 09/07/2019 for further evaluation of diarrhea. Results showed a suspected benign prominent small lymphangiectasias in the terminal ileum that was biopsied and negative. Examination was otherwise normal.  The patient followed-up with Dr. Berline Lopes on 12/03/2019, during which time she was doing well and remained under surveillance. PAP smear was done at that time and was negative for intraepithelial lesion or malignancy. High-risk HPV was not detected.  Of note, the patient underwent a chest CT scan on 02/06/2020 to evaluate known pulmonary nodule. Results showed a stable appearing nodular opacity that measured 6 mm in the posterior segment of the right upper lobe. Stability over two years suggested benign etiology. Additionally, there was an area of opacity toward the apex in the left upper lobe  that appeared less mass-like than on the previous study and was likely due to slight scarring. The lungs were otherwise clear. There was no edema, airspace opacity, or evidence of adenopathy.   On review of symptoms, she reports ongoing diarrhea this overall seems to be better.  She knows which foods to avoid. She denies rectal bleeding vaginal bleeding or hematuria.  She denies any pelvic pain..                               Allergies:  is allergic to amoxicillin and penicillins.  Meds: Current Outpatient Medications  Medication Sig Dispense Refill  . albuterol (PROAIR HFA) 108 (90 Base) MCG/ACT inhaler Inhale 2 puffs into the lungs every 6 (six) hours as needed for wheezing or shortness of breath. 1 Inhaler 0  . dicyclomine (BENTYL) 10 MG capsule Take 1 tablet three times daily 20-30 minutes before meals 90 capsule 3  . omeprazole (PRILOSEC) 40 MG capsule Take 1 capsule daily 30-60 minutes before breakfast 30 capsule 3  . Probiotic Product (ALIGN) 4 MG CAPS Take 1 capsule daily for 2 months     No current facility-administered medications for this encounter.    Physical Findings: The patient is in no acute distress. Patient is alert and oriented.  vitals were not taken for this visit.   Lungs are clear to auscultation bilaterally. Heart has regular rate and rhythm. No palpable cervical, supraclavicular, or axillary adenopathy. Abdomen soft, non-tender, normal bowel sounds. On pelvic examination the external genitalia were unremarkable. A speculum exam was performed. There are no mucosal lesions noted in the vaginal vault. On bimanual and rectovaginal  examination there were no pelvic masses appreciated.  The cervical os is flush with the upper vaginal area.  Some agglutination of tissues in the proximal vagina.   Lab Findings: Lab Results  Component Value Date   WBC 6.4 08/15/2019   HGB 13.0 08/15/2019   HCT 38.9 08/15/2019   MCV 87.4 08/15/2019   PLT 296.0 08/15/2019     Radiographic Findings: No results found.  Impression: Invasive adenocarcinoma of the cervix, clinical stage IB2 adenocarcinoma cervix with radiographically suspicious pelvic lymphadenopathy and left supraclavicular adenopathy  Recent PAP smear was negative for intraepithelial lesion or malignancy. High-risk HPV was not detected. Colonoscopy was also negative.  No evidence of recurrence on clinical exam today  Plan: The patient is scheduled to follow-up with Dr. Berline Lopes in 6 months since patient is now 3 years out from her radiation therapy she will follow-up with radiation oncology in 1 year.   Total time spent in this encounter was 25 minutes which included reviewing the patient's most recent follow-ups, PAP smear, chest CT scan, colonoscopy, physical examination, and documentation.  ____________________________________  Blair Promise, PhD, MD  This document serves as a record of services personally performed by Gery Pray, MD. It was created on his behalf by Clerance Lav, a trained medical scribe. The creation of this record is based on the scribe's personal observations and the provider's statements to them. This document has been checked and approved by the attending provider.

## 2020-03-17 NOTE — Progress Notes (Signed)
Returns for six month follow up with Dr. Sondra Come. Weight and vitals stable. Denies pain. Denies vaginal odor, itching or discharge. Denies vaginal bleeding. Reports occasional bowel incontinence related to urgency. Reports daily diarrhea following an episode of stomach cramping. Reports LUNG CT was stable and follow up is set for six months out. Reports returning to work full time.   BP 116/79 (BP Location: Left Arm, Patient Position: Sitting, Cuff Size: Normal)    Pulse 80    Temp 97.7 F (36.5 C)    Resp 18    Ht 5\' 2"  (1.575 m)    Wt 175 lb 12.8 oz (79.7 kg)    SpO2 98%    BMI 32.15 kg/m  Wt Readings from Last 3 Encounters:  03/17/20 175 lb 12.8 oz (79.7 kg)  12/03/19 174 lb (78.9 kg)  09/11/19 170 lb 6.4 oz (77.3 kg)

## 2020-06-04 ENCOUNTER — Ambulatory Visit: Payer: Self-pay | Admitting: Gynecologic Oncology

## 2020-06-12 ENCOUNTER — Telehealth: Payer: Self-pay | Admitting: Oncology

## 2020-06-12 NOTE — Telephone Encounter (Signed)
Called Joanne Griffin and scheduled follow up appointment with Dr. Berline Lopes for September 10, 2020 at 1:15.  She verbalized agreement.

## 2020-09-09 ENCOUNTER — Encounter: Payer: Self-pay | Admitting: Gynecologic Oncology

## 2020-09-10 ENCOUNTER — Encounter: Payer: Self-pay | Admitting: Gynecologic Oncology

## 2020-09-10 ENCOUNTER — Other Ambulatory Visit (HOSPITAL_COMMUNITY)
Admission: RE | Admit: 2020-09-10 | Discharge: 2020-09-10 | Disposition: A | Discharge status: Home / Self Care | Payer: Self-pay | Source: Ambulatory Visit | Attending: Gynecologic Oncology | Admitting: Gynecologic Oncology

## 2020-09-10 ENCOUNTER — Inpatient Hospital Stay: Payer: Self-pay | Attending: Gynecologic Oncology | Admitting: Gynecologic Oncology

## 2020-09-10 ENCOUNTER — Other Ambulatory Visit: Payer: Self-pay

## 2020-09-10 VITALS — BP 105/71 | HR 73 | Temp 97.2°F | Resp 20 | Ht 62.0 in | Wt 166.0 lb

## 2020-09-10 DIAGNOSIS — C531 Malignant neoplasm of exocervix: Secondary | ICD-10-CM

## 2020-09-10 DIAGNOSIS — Z87891 Personal history of nicotine dependence: Secondary | ICD-10-CM | POA: Insufficient documentation

## 2020-09-10 DIAGNOSIS — Z8541 Personal history of malignant neoplasm of cervix uteri: Secondary | ICD-10-CM | POA: Insufficient documentation

## 2020-09-10 DIAGNOSIS — Z923 Personal history of irradiation: Secondary | ICD-10-CM | POA: Insufficient documentation

## 2020-09-10 DIAGNOSIS — Z9221 Personal history of antineoplastic chemotherapy: Secondary | ICD-10-CM | POA: Insufficient documentation

## 2020-09-10 NOTE — Patient Instructions (Signed)
I will call you with pap test results once I have them. As long as these are negative, I will see you in a year (you will see Dr. Sondra Come in about 6 months). Please call in January 2022 to get a visit scheduled in March with me.  As always, if you develop any symptoms concerning for cancer recurrence before then (vaginal bleeding, pain), please call sooner to come see me. [phone]

## 2020-09-10 NOTE — Progress Notes (Signed)
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 She presented initially with flank pain to the emergency room on 07/29/2016. She was treated for a UTI and had resolution of her flank pain although she continued to have suprapubic pain. She has had irregular bleeding and a copious discharge for several months.   08/26/2016 Pathology Results   Pap test: Adequacy Reason - Satisfactory for evaluation, endocervical/transformation zone component PRESENT. Diagnosis - ATYPICAL GLANDULAR CELLS. COMMENT: THERE ARE GROUPS OF ATYPICAL GLANDULAR CELLS WHICH APPEAR TO BE OF ENDOCERVICAL ORIGIN. TISSUE STUDIES, SUCH AS A CURETTAGE, MAY HELP BETTER EVALUATE THE EXTENT AND SEVERITY OF THESE ATYPICAL CELLS.   09/01/2016 Pathology Results   Cervix, biopsy - INVASIVE ADENOCARCINOMA, SEE COMMENT. Microscopic Comment Immunohistochemistry is attempted to determine endocervical vs. endometrial origin. CEA is focally positive, p16 is diffusely strongly positive, ER is weakly positive, and vimentin is positive.   10/05/2016 Imaging   CT A/P 4.7 cm heterogeneous enhancing mass in the cervix and lower uterine segment, consistent with known cervical carcinoma. Mild retroperitoneal lymphadenopathy in the inferior aortocaval space and right common iliac chain, suspicious for metastatic disease.   10/29/2016 Procedure   Port placement   11/09/2016 - 01/26/2017 Radiation Therapy   Radiation treatment dates:   11/09/2016 - 12/23/2016 and HDR on 01/04/2017, 01/10/2017, 01/17/2017, 01/24/2017, 01/26/2017 Site/dose:    1) The cervix was treated to 45 Gy in 25 fractions,   2) The pelvis was  treated to 9 Gy in 5 fractions as a boost to the suspicious pelvic  nodes. (54 Gy) 3) Sclav-LT was initially prescribed to 60 Gy in 30 fractions, but the patient discontinued at fraction 28 due to severe skin reactions. (56 Gy) 4) The cervix was also treated to 27.5 Gy in 5 fractions of 5.5 Gy. Beams/energy:   1) Cervix: 3D // Photon 2) pelvic Boost: IMRT // Photon 3) Sclav-Lt: 3D // 6X, 10X Photon 4) HDR Ir-192 // Brachytherapy   11/09/2016 - 12/08/2016 Chemotherapy   She receives weekly cisplatin   03/15/2017 Procedure   Removal of implanted Port-A-Cath utilizing sharp and blunt dissection. The procedure was uncomplicated.   01/04/2018 PET scan   1. New hypermetabolic RIGHT upper lobe pulmonary nodule coupled with new hypermetabolic RIGHT paratracheal lymph node. Differential includes small pulmonary infection with reactive node versus pulmonary metastasis and mediastinal nodal metastasis. As pulmonary lesion was not present 3 months prior infection is a distinct possibility however the intensity of the paratracheal node favors malignancy. Consider short-term follow-up CT with contrast (1 month). If lesion persists or enlarges malignancy is likely. 2. No evidence of abnormal metabolic the uterine cervix. No pelvic adenopathy. 3. No evidence of malignancy in the occipital region.   02/02/2018 Imaging   CT chest Stable appearance of the 6 mm right upper lobe pulmonary nodule with no change in the right paratracheal and precarinal lymph nodes. Given hypermetabolism seen on the PET-CT 1 month ago, continued close attention will be required.   05/03/2018 Imaging   CT chest Right upper lobe nodule and mediastinal/right hilar lymph nodes are stable from 02/02/2018. Right upper lobe nodule is new from 09/27/2017. Findings remain worrisome for malignancy. Close attention on follow-up exams is  warranted.   08/03/2018 Imaging   CT chest 1. Stable exam. No change in solid nodule within the right upper  lobe compared with 02/02/18. Continued close interval follow-up is advised. 2. There are 2 tiny thin walled cystic nodules within the left apex and right lower lobe. These are nonspecific and may be postinflammatory in etiology. Pulmonary Langerhans cell histiocytosis may present with thin walled cystic lesions and can be seen almost exclusively in young adults with a history of current or previous smoking.   12/2018 Imaging   CT C/A/P 1. No interval change in size or appearance of solid 7 mm right upper lobe pulmonary nodule when compared to prior CT 02/02/2018. Continued close interval follow-up is advised. 2. Thin-walled cyst in the right lower lobe appears less prominent compared to the prior study. 3. Small focal area of ground-glass in the left lung apex is unchanged compared to 02/02/2018. 4. No new pulmonary nodules.   02/2019 Imaging   CT Urogram Mild colitis and cystitis.   02/2019 Miscellaneous   Pap ASCUS +hrHPV   04/26/2019 Pathology Results   Colpo: CERVIX biopsies, 3 and 12 O'CLOCK: -  Low grade squamous intraepithelial lesion (CIN1, mild dysplasia)  CERVIX, 7 O'CLOCK, BIOPSY:  -  Low grade squamous intraepithelial lesion (CIN1, mild dysplasia)  -  P16 immunohistochemistry supports the diagnosis of low grade  dysplasia.  There was a small focus on the original HE concerning for a  higher-grade lesion. ENDOCERVIX, CURETTAGE:  -  Scant atypical squamous epithelium  -  The curettage sample is minute with a small focus of hyperchromatic  atypical squamous epithelium.  Additional sampling may be warranted.    05/04/2019 Imaging   CT C/A/P: 1. There is wall thickening of the distal rectum as well as a few loops of small bowel within the pelvis. Findings are nonspecific however may be secondary to post radiation changes. Small bowel enteritis or distal colonic colitis are not excluded. 2. Mild interval increase in soft tissue stranding and fluid within the pelvis which  may be secondary to post radiation changes or infectious/inflammatory process. Possibility of localized recurrence/malignant fluid within the pelvis is not excluded. 3. Similar-appearing pulmonary findings including right upper lobe pulmonary nodule and left upper lobe ground-glass nodule. 4. Unchanged precarinal and right hilar lymph nodes.   08/14/2019 Imaging   CT chest 1. Stable bilateral pulmonary nodules as detailed above. No new pulmonary nodule identified. 2. No acute abnormality detected.   02/05/2021 Imaging   CT chest 1. Stable appearing nodular opacity measuring 6 mm in the posterior segment of the right upper lobe. This lesion has been stable for a 2 year time span. Stability over 2 years suggests benign etiology. Given history of cervical carcinoma, it may be reasonable to consider an additional follow-up of this nodular opacity in approximately 1 year for further confirmation of stability. Postop post   2. An area of opacity toward the apex in the left upper lobe appears less masslike than on previous study and is likely due to slight scarring.   3.  Lungs otherwise clear.  No edema or airspace opacity.   4.  No evident adenopathy.   5.  Hepatic steatosis.     Interval History: Joanne Griffin presents today for follow-up.  Overall she reports doing well.  She continues to have some diarrhea but feels like she is managing this better.  She tends to only have it in the morning and has cramping before it happens.  Occasionally she will have  episodes during the day that are usually triggered by anxiety.  She is no longer taking Imodium or medications to treat the diarrhea.  She reports some minimal spotting after intercourse.  This is very limited and she denies vaginal bleeding otherwise.  She has not been using lubrication.  She denies any pelvic or abdominal pain.  She denies any vaginal discharge.  She reports regular bladder function.  She is looking forward to taking  a trip with her grandparents to [place] this summer.  Past Medical/Surgical History: Past Medical History:  Diagnosis Date  . Anxiety   . Cervical cancer [facility]) oncologist-  dr gorsuch/  dr Sondra Come   dx 09-01-2016-- Stage IB2--  Invasive cervix adenocarcinoma-- completed chemotherapy (11-09-2016 to 12-08-2016) concurrent w/ external beam radiation (completed 12-22-2016))  . Depression   . History of cancer chemotherapy 11-09-2016 to 12-08-2016  . History of radiation therapy 11-09-2016  to 12-22-2016   cervix was treated to 45 Gy, pelvis was treated to 9 Gy, Sclav left was treated to 56 Gy.  Marland Kitchen History of radiation therapy 01/04/17, 01/10/17, 01/17/17, 01/24/17, 01/26/17   cervix was treated to 27.5 Gy in 5 fractions  . Leukopenia due to antineoplastic chemotherapy [facility])     Past Surgical History:  Procedure Laterality Date  . CYST REMOVAL HAND Right age 57  . IR FLUORO GUIDE PORT INSERTION RIGHT  10/29/2016  . IR REMOVAL TUN ACCESS W/ PORT W/O FL MOD SED  03/15/2017  . IR US GUIDE VASC ACCESS RIGHT  10/29/2016  . TANDEM RING INSERTION N/A 01/04/2017   Procedure: TANDEM RING INSERTION;  Surgeon: Gery Pray, MD;  Location: [facility];  Service: Urology;  Laterality: N/A;  . TANDEM RING INSERTION N/A 01/10/2017   Procedure: TANDEM RING INSERTION;  Surgeon: Gery Pray, MD;  Location: [facility];  Service: Urology;  Laterality: N/A;  . TANDEM RING INSERTION N/A 01/17/2017   Procedure: TANDEM RING INSERTION;  Surgeon: Gery Pray, MD;  Location: [facility];  Service: Urology;  Laterality: N/A;  . TANDEM RING INSERTION N/A 01/24/2017   Procedure: TANDEM RING INSERTION;  Surgeon: Gery Pray, MD;  Location: [facility];  Service: Urology;  Laterality: N/A;  . TANDEM RING INSERTION N/A 01/26/2017   Procedure: TANDEM RING INSERTION;  Surgeon: Gery Pray, MD;  Location: [facility]  Service: Urology;  Laterality: N/A;  .  VIDEO BRONCHOSCOPY WITH ENDOBRONCHIAL ULTRASOUND Right 09/13/2018   Procedure: VIDEO BRONCHOSCOPY WITH ENDOBRONCHIAL ULTRASOUND;  Surgeon: Margaretha Seeds, MD;  Location: [place];  Service: Thoracic;  Laterality: Right;  . WISDOM TOOTH EXTRACTION     times 4    Family History  Problem Relation Age of Onset  . Diabetes Maternal Grandmother   . Breast cancer Paternal Grandmother     Social History   Socioeconomic History  . Marital status: Legally Separated    Spouse name: Not on file  . Number of children: 0  . Years of education: Not on file  . Highest education level: Not on file  Occupational History  . Not on file  Tobacco Use  . Smoking status: Former Smoker    Packs/day: 1.00    Years: 12.00    Pack years: 12.00    Types: Cigarettes    Quit date: 08/12/2018    Years since quitting: 2.0  . Smokeless tobacco: Never Used  Vaping Use  . Vaping Use: Former  . Start date: 06/28/2012  . Quit date: 07/29/2016  .  Substances: Nicotine, THC, Flavoring  . Devices: jewel, Scientist, research (life sciences), THC  Substance and Sexual Activity  . Alcohol use: No  . Drug use: Yes    Types: Marijuana    Comment: last use 09/11/2018  . Sexual activity: Yes    Birth control/protection: Surgical  Other Topics Concern  . Not on file  Social History Narrative  . Not on file   Social Determinants of Health   Financial Resource Strain: Not on file  Food Insecurity: Not on file  Transportation Needs: Not on file  Physical Activity: Not on file  Stress: Not on file  Social Connections: Not on file    Current Medications:  Current Outpatient Medications:  .  albuterol (PROAIR HFA) 108 (90 Base) MCG/ACT inhaler, Inhale 2 puffs into the lungs every 6 (six) hours as needed for wheezing or shortness of breath. (Patient not taking: Reported on 09/09/2020), Disp: 1 Inhaler, Rfl: 0 .  dicyclomine (BENTYL) 10 MG capsule, Take 1 tablet three times daily 20-30 minutes before meals (Patient not taking: No sig  reported), Disp: 90 capsule, Rfl: 3 .  omeprazole (PRILOSEC) 40 MG capsule, Take 1 capsule daily 30-60 minutes before breakfast (Patient not taking: Reported on 09/09/2020), Disp: 30 capsule, Rfl: 3  Review of Systems: Denies appetite changes, fevers, chills, fatigue, unexplained weight changes. Denies hearing loss, neck lumps or masses, mouth sores, ringing in ears or voice changes. Denies cough or wheezing.  Denies shortness of breath. Denies chest pain or palpitations. Denies leg swelling. Denies abdominal distention, pain, blood in stools, constipation, diarrhea, nausea, vomiting, or early satiety. Denies pain with intercourse, dysuria, frequency, hematuria or incontinence. Denies hot flashes, pelvic pain, vaginal bleeding or vaginal discharge.   Denies joint pain, back pain or muscle pain/cramps. Denies itching, rash, or wounds. Denies dizziness, headaches, numbness or seizures. Denies swollen lymph nodes or glands, denies easy bruising or bleeding. Denies anxiety, depression, confusion, or decreased concentration.  Physical Exam: BP 105/71 (BP Location: Left Arm, Patient Position: Sitting)   Pulse 73   Temp (!) 97.2 F (36.2 C) (Tympanic)   Resp 20   Ht 5\' 2"  (1.575 m)   Wt 166 lb (75.3 kg)   SpO2 99% Comment: RA  BMI 30.36 kg/m  General: Alert, oriented, no acute distress. HEENT: Normocephalic, atraumatic, sclera anicteric. Chest: Unlabored breathing on room air. Abdomen: soft, nontender.  Normoactive bowel sounds.  No masses or hepatosplenomegaly appreciated.   Extremities: Grossly normal range of motion.  Warm, well perfused.  No edema bilaterally. Skin: No rashes or lesions noted. Lymphatics: No cervical, supraclavicular, or inguinal adenopathy. GU: Normal appearing external genitalia without erythema, excoriation, or lesions.  Speculum exam reveals moderately atrophic vaginal mucosa with mild agglutination at the vaginal apex.  Tissue is significantly hypopigmented with  changes consistent with prior radiation.  No masses or lesions noted.  Pap and HPV collected.  Bimanual exam reveals no cervical masses or nodularity.  Rectovaginal exam confirms these findings.  Laboratory & Radiologic Studies: None new  Assessment & Plan: Joanne Griffin is a 35 y.o. woman with history ofStage IB2 adenocarcinoma of the cervix s/pprimary treatment with radiation and sensitizing cisplatinwho presents for surveillance visit.  Patient is doing well and is NED on exam today.    Given that we are off cycle a little bit, I performed a Pap and HPV testing today.  I will call her with the results.  As long as these are both negative, then we will plan to have her see  Dr. Sondra Come in 6 months and come back and see me in a year.    She is now about 3-1/2 years out from completion of treatment.  Per NCCN and SGO surveillance recommendations, we have move to visits every 6 months which we will continue until she is 5 years out from treatment completion. We reviewed signs and symptoms that be worrisome for disease recurrence and the patient knows to call the clinic if she develops any of these before her next visit.  In terms of postcoital spotting, we discussed trial of lubrication.  28 minutes of total time was spent for this patient encounter, including preparation, face-to-face counseling with the patient and coordination of care, and documentation of the encounter.  Jeral Pinch, MD  Division of Gynecologic Oncology  Department of Obstetrics and Gynecology  [facility]

## 2020-09-15 ENCOUNTER — Telehealth: Payer: Self-pay | Admitting: Oncology

## 2020-09-15 ENCOUNTER — Telehealth: Payer: Self-pay

## 2020-09-15 NOTE — Telephone Encounter (Signed)
Please add HPV 16/18 subtype to Ms. Dreama Saa Pap smear from 09-10-20.  Accession # [id number]  Sent this message in an e-mail to Luellen Pucker in [facility] per her request to forward to [facility].

## 2020-09-15 NOTE — Telephone Encounter (Signed)
Called [name] and scheduled appointment with Dr. Berline Lopes for 10/03/20 at 11:30.

## 2020-09-17 LAB — CYTOLOGY - PAP
Comment: NEGATIVE
Comment: NEGATIVE
HPV 16: NEGATIVE
HPV 18 / 45: NEGATIVE
High risk HPV: POSITIVE — AB

## 2020-10-01 ENCOUNTER — Encounter: Payer: Self-pay | Admitting: Gynecologic Oncology

## 2020-10-03 ENCOUNTER — Other Ambulatory Visit: Payer: Self-pay

## 2020-10-03 ENCOUNTER — Inpatient Hospital Stay: Payer: Self-pay | Attending: Gynecologic Oncology | Admitting: Gynecologic Oncology

## 2020-10-03 ENCOUNTER — Encounter: Payer: Self-pay | Admitting: Gynecologic Oncology

## 2020-10-03 VITALS — BP 129/89 | HR 68 | Temp 97.6°F | Resp 17 | Ht 62.0 in | Wt 169.6 lb

## 2020-10-03 DIAGNOSIS — R87612 Low grade squamous intraepithelial lesion on cytologic smear of cervix (LGSIL): Secondary | ICD-10-CM | POA: Insufficient documentation

## 2020-10-03 DIAGNOSIS — Z79899 Other long term (current) drug therapy: Secondary | ICD-10-CM | POA: Insufficient documentation

## 2020-10-03 DIAGNOSIS — Z9221 Personal history of antineoplastic chemotherapy: Secondary | ICD-10-CM | POA: Insufficient documentation

## 2020-10-03 DIAGNOSIS — Z87891 Personal history of nicotine dependence: Secondary | ICD-10-CM | POA: Insufficient documentation

## 2020-10-03 DIAGNOSIS — Z8541 Personal history of malignant neoplasm of cervix uteri: Secondary | ICD-10-CM | POA: Insufficient documentation

## 2020-10-03 DIAGNOSIS — C531 Malignant neoplasm of exocervix: Secondary | ICD-10-CM

## 2020-10-03 DIAGNOSIS — Z923 Personal history of irradiation: Secondary | ICD-10-CM | POA: Insufficient documentation

## 2020-10-03 NOTE — Progress Notes (Addendum)
Gynecologic Oncology Return Clinic Visit  10/03/20  Reason for Visit: Colposcopy  Treatment History: Oncology History  Adenocarcinoma (GYN origin) (Resolved)  09/01/2016 Initial Diagnosis   Adenocarcinoma (GYN origin)   Cervical cancer ([place])  08/26/2016 Initial Diagnosis   The patient reports she is not had Pap smears in the "many years". She presented initially with flank pain to the emergency room on 07/29/2016. She was treated for a UTI and had resolution of her flank pain although she continued to have suprapubic pain. She has had irregular bleeding and a copious discharge for several months.   08/26/2016 Pathology Results   Pap test: Adequacy Reason - Satisfactory for evaluation, endocervical/transformation zone component PRESENT. Diagnosis - ATYPICAL GLANDULAR CELLS. COMMENT: THERE ARE GROUPS OF ATYPICAL GLANDULAR CELLS WHICH APPEAR TO BE OF ENDOCERVICAL ORIGIN. TISSUE STUDIES, SUCH AS A CURETTAGE, MAY HELP BETTER EVALUATE THE EXTENT AND SEVERITY OF THESE ATYPICAL CELLS.   09/01/2016 Pathology Results   Cervix, biopsy - INVASIVE ADENOCARCINOMA, SEE COMMENT. Microscopic Comment Immunohistochemistry is attempted to determine endocervical vs. endometrial origin. CEA is focally positive, p16 is diffusely strongly positive, ER is weakly positive, and vimentin is positive.   10/05/2016 Imaging   CT A/P 4.7 cm heterogeneous enhancing mass in the cervix and lower uterine segment, consistent with known cervical carcinoma. Mild retroperitoneal lymphadenopathy in the inferior aortocaval space and right common iliac chain, suspicious for metastatic disease.   10/29/2016 Procedure   Port placement   11/09/2016 - 01/26/2017 Radiation Therapy   Radiation treatment dates:   11/09/2016 - 12/23/2016 and HDR on 01/04/2017, 01/10/2017, 01/17/2017, 01/24/2017, 01/26/2017 Site/dose:    1) The cervix was treated to 45 Gy in 25 fractions,   2) The pelvis was treated to 9 Gy in 5 fractions as a boost to the  suspicious pelvic  nodes. (54 Gy) 3) Sclav-LT was initially prescribed to 60 Gy in 30 fractions, but the patient discontinued at fraction 28 due to severe skin reactions. (56 Gy) 4) The cervix was also treated to 27.5 Gy in 5 fractions of 5.5 Gy. Beams/energy:   1) Cervix: 3D // Photon 2) pelvic Boost: IMRT // Photon 3) Sclav-Lt: 3D // 6X, 10X Photon 4) HDR Ir-192 // Brachytherapy   11/09/2016 - 12/08/2016 Chemotherapy   She receives weekly cisplatin   03/15/2017 Procedure   Removal of implanted Port-A-Cath utilizing sharp and blunt dissection. The procedure was uncomplicated.   01/04/2018 PET scan   1. New hypermetabolic RIGHT upper lobe pulmonary nodule coupled with new hypermetabolic RIGHT paratracheal lymph node. Differential includes small pulmonary infection with reactive node versus pulmonary metastasis and mediastinal nodal metastasis. As pulmonary lesion was not present 3 months prior infection is a distinct possibility however the intensity of the paratracheal node favors malignancy. Consider short-term follow-up CT with contrast (1 month). If lesion persists or enlarges malignancy is likely. 2. No evidence of abnormal metabolic the uterine cervix. No pelvic adenopathy. 3. No evidence of malignancy in the occipital region.   02/02/2018 Imaging   CT chest Stable appearance of the 6 mm right upper lobe pulmonary nodule with no change in the right paratracheal and precarinal lymph nodes. Given hypermetabolism seen on the PET-CT 1 month ago, continued close attention will be required.   05/03/2018 Imaging   CT chest Right upper lobe nodule and mediastinal/right hilar lymph nodes are stable from 02/02/2018. Right upper lobe nodule is new from 09/27/2017. Findings remain worrisome for malignancy. Close attention on follow-up exams is warranted.   08/03/2018 Imaging  CT chest 1. Stable exam. No change in solid nodule within the right upper lobe compared with 02/02/18. Continued close interval  follow-up is advised. 2. There are 2 tiny thin walled cystic nodules within the left apex and right lower lobe. These are nonspecific and may be postinflammatory in etiology. Pulmonary Langerhans cell histiocytosis may present with thin walled cystic lesions and can be seen almost exclusively in young adults with a history of current or previous smoking.   12/2018 Imaging   CT C/A/P 1. No interval change in size or appearance of solid 7 mm right upper lobe pulmonary nodule when compared to prior CT 02/02/2018. Continued close interval follow-up is advised. 2. Thin-walled cyst in the right lower lobe appears less prominent compared to the prior study. 3. Small focal area of ground-glass in the left lung apex is unchanged compared to 02/02/2018. 4. No new pulmonary nodules.   02/2019 Imaging   CT Urogram Mild colitis and cystitis.   02/2019 Miscellaneous   Pap ASCUS +hrHPV   04/26/2019 Pathology Results   Colpo: CERVIX biopsies, 3 and 12 O'CLOCK: -  Low grade squamous intraepithelial lesion (CIN1, mild dysplasia)  CERVIX, 7 O'CLOCK, BIOPSY:  -  Low grade squamous intraepithelial lesion (CIN1, mild dysplasia)  -  P16 immunohistochemistry supports the diagnosis of low grade  dysplasia.  There was a small focus on the original HE concerning for a  higher-grade lesion. ENDOCERVIX, CURETTAGE:  -  Scant atypical squamous epithelium  -  The curettage sample is minute with a small focus of hyperchromatic  atypical squamous epithelium.  Additional sampling may be warranted.    05/04/2019 Imaging   CT C/A/P: 1. There is wall thickening of the distal rectum as well as a few loops of small bowel within the pelvis. Findings are nonspecific however may be secondary to post radiation changes. Small bowel enteritis or distal colonic colitis are not excluded. 2. Mild interval increase in soft tissue stranding and fluid within the pelvis which may be secondary to post radiation changes  or infectious/inflammatory process. Possibility of localized recurrence/malignant fluid within the pelvis is not excluded. 3. Similar-appearing pulmonary findings including right upper lobe pulmonary nodule and left upper lobe ground-glass nodule. 4. Unchanged precarinal and right hilar lymph nodes.   08/14/2019 Imaging   CT chest 1. Stable bilateral pulmonary nodules as detailed above. No new pulmonary nodule identified. 2. No acute abnormality detected.   02/05/2021 Imaging   CT chest 1. Stable appearing nodular opacity measuring 6 mm in the posterior segment of the right upper lobe. This lesion has been stable for a 2 year time span. Stability over 2 years suggests benign etiology. Given history of cervical carcinoma, it may be reasonable to consider an additional follow-up of this nodular opacity in approximately 1 year for further confirmation of stability. Postop post   2. An area of opacity toward the apex in the left upper lobe appears less masslike than on previous study and is likely due to slight scarring.   3.  Lungs otherwise clear.  No edema or airspace opacity.   4.  No evident adenopathy.   5.  Hepatic steatosis.     Interval History: Patient presents today for colposcopy after recent annual cotesting showed L SIL Pap with positive high-risk HPV (16/18 negative).  She is doing well.  She denies any new symptoms since her last visit with me.  Past Medical/Surgical History: Past Medical History:  Diagnosis Date  . Anxiety   . Cervical cancer (  [facility]) oncologist-  dr gorsuch/  dr Sondra Come   dx 09-01-2016-- Stage IB2--  Invasive cervix adenocarcinoma-- completed chemotherapy (11-09-2016 to 12-08-2016) concurrent w/ external beam radiation (completed 12-22-2016))  . Depression   . History of cancer chemotherapy 11-09-2016 to 12-08-2016  . History of radiation therapy 11-09-2016  to 12-22-2016   cervix was treated to 45 Gy, pelvis was treated to 9 Gy, Sclav left was  treated to 56 Gy.  Marland Kitchen History of radiation therapy 01/04/17, 01/10/17, 01/17/17, 01/24/17, 01/26/17   cervix was treated to 27.5 Gy in 5 fractions  . Leukopenia due to antineoplastic chemotherapy [facility])     Past Surgical History:  Procedure Laterality Date  . CYST REMOVAL HAND Right age 54  . IR FLUORO GUIDE PORT INSERTION RIGHT  10/29/2016  . IR REMOVAL TUN ACCESS W/ PORT W/O FL MOD SED  03/15/2017  . IR US GUIDE VASC ACCESS RIGHT  10/29/2016  . TANDEM RING INSERTION N/A 01/04/2017   Procedure: TANDEM RING INSERTION;  Surgeon: Gery Pray, MD;  Location: [facility];  Service: Urology;  Laterality: N/A;  . TANDEM RING INSERTION N/A 01/10/2017   Procedure: TANDEM RING INSERTION;  Surgeon: Gery Pray, MD;  Location: [facility];  Service: Urology;  Laterality: N/A;  . TANDEM RING INSERTION N/A 01/17/2017   Procedure: TANDEM RING INSERTION;  Surgeon: Gery Pray, MD;  Location: [facility];  Service: Urology;  Laterality: N/A;  . TANDEM RING INSERTION N/A 01/24/2017   Procedure: TANDEM RING INSERTION;  Surgeon: Gery Pray, MD;  Location: [facility];  Service: Urology;  Laterality: N/A;  . TANDEM RING INSERTION N/A 01/26/2017   Procedure: TANDEM RING INSERTION;  Surgeon: Gery Pray, MD;  Location: [facility];  Service: Urology;  Laterality: N/A;  . VIDEO BRONCHOSCOPY WITH ENDOBRONCHIAL ULTRASOUND Right 09/13/2018   Procedure: VIDEO BRONCHOSCOPY WITH ENDOBRONCHIAL ULTRASOUND;  Surgeon: Margaretha Seeds, MD;  Location: [place];  Service: Thoracic;  Laterality: Right;  . WISDOM TOOTH EXTRACTION     times 4    Family History  Problem Relation Age of Onset  . Diabetes Maternal Grandmother   . Breast cancer Paternal Grandmother     Social History   Socioeconomic History  . Marital status: Legally Separated    Spouse name: Not on file  . Number of children: 0  . Years of education: Not on file  . Highest education  level: Not on file  Occupational History  . Not on file  Tobacco Use  . Smoking status: Former Smoker    Packs/day: 1.00    Years: 12.00    Pack years: 12.00    Types: Cigarettes    Quit date: 08/12/2018    Years since quitting: 2.1  . Smokeless tobacco: Never Used  Vaping Use  . Vaping Use: Former  . Start date: 06/28/2012  . Quit date: 07/29/2016  . Substances: Nicotine, THC, Flavoring  . Devices: jewel, Scientist, research (life sciences), THC  Substance and Sexual Activity  . Alcohol use: No  . Drug use: Yes    Types: Marijuana    Comment: last use 09/11/2018  . Sexual activity: Yes    Birth control/protection: Surgical  Other Topics Concern  . Not on file  Social History Narrative  . Not on file   Social Determinants of Health   Financial Resource Strain: Not on file  Food Insecurity: Not on file  Transportation Needs: Not on file  Physical Activity: Not on file  Stress:  Not on file  Social Connections: Not on file    Current Medications:  Current Outpatient Medications:  .  albuterol (PROAIR HFA) 108 (90 Base) MCG/ACT inhaler, Inhale 2 puffs into the lungs every 6 (six) hours as needed for wheezing or shortness of breath. (Patient not taking: No sig reported), Disp: 1 Inhaler, Rfl: 0 .  omeprazole (PRILOSEC) 40 MG capsule, Take 1 capsule daily 30-60 minutes before breakfast (Patient not taking: No sig reported), Disp: 30 capsule, Rfl: 3  Review of Systems: + ringing in ears, diarrhea, pain with intercourse, urinary frequency, hot flashes. Denies appetite changes, fevers, chills, fatigue, unexplained weight changes. Denies hearing loss, neck lumps or masses, mouth sores or voice changes. Denies cough or wheezing.  Denies shortness of breath. Denies chest pain or palpitations. Denies leg swelling. Denies abdominal distention, pain, blood in stools, constipation, nausea, vomiting, or early satiety. Denies dysuria, hematuria or incontinence. Denies pelvic pain, vaginal bleeding or  vaginal discharge.   Denies joint pain, back pain or muscle pain/cramps. Denies itching, rash, or wounds. Denies dizziness, headaches, numbness or seizures. Denies swollen lymph nodes or glands, denies easy bruising or bleeding. Denies anxiety, depression, confusion, or decreased concentration.  Physical Exam: BP 129/89 (BP Location: Right Arm, Patient Position: Sitting)   Pulse 68   Temp 97.6 F (36.4 C) (Tympanic)   Resp 17   Ht 5\' 2"  (1.575 m)   Wt 169 lb 9.6 oz (76.9 kg)   SpO2 100%   BMI 31.02 kg/m  General: Alert, oriented, no acute distress. HEENT: Normocephalic, atraumatic, sclera anicteric. Chest: Unlabored breathing on room air Extremities: Grossly normal range of motion.  Warm, well perfused.  No edema bilaterally. Skin: No rashes or lesions noted.  Colposcopy procedure: Preoperative diagnosis: LSIL on recent Pap Postoperative diagnosis: same as above Physician: Berline Lopes MD Estimated blood loss: Minimal Specimens: Cervical biopsy from 4:00 Procedure: After the procedure was discussed with the patient and she gave verbal consent, patient was placed in dorsal lithotomy position.  Speculum was placed in the vagina and the cervix was well visualized.  Vaginal apex was notable for significant changes related to prior radiation.  Acetic acid was then applied to the cervix and some acetowhite changes were noted from approximately 3-5 o'clock.  No significant atypical vascularity, punctations, or mosaicism were noted.  Tissue was very difficult to biopsy but ultimately Tischler forceps were able to biopsy at 4:00.  Specimen was placed in formalin to be sent to pathology.  Patient tolerated the procedure well.  All instruments were removed from the vagina.  Laboratory & Radiologic Studies: None new  Assessment & Plan: Joanne Griffin is a 35 y.o. woman with history ofStage IB2 adenocarcinoma of the cervix s/pprimary treatment with radiation and sensitizing cisplatinwho  presents forcolposcopy given recent cotesting showing low-grade squamous intraepithelial lesion and positive for high risk HPV, 16 and 18 -.  Colposcopy was overall reassuring with that most changes consistent with CIN-1 visually.  Biopsy taken where the acetowhite/radiation changes noted.  I will call the patient with these biopsy results.  If no abnormality or low-grade dysplasia noted, then we will continue current follow-up plan.  She is scheduled to see radiation oncology in 6 months.  High-grade dysplasia is noted, then we will discuss next step in management which will likely include outpatient procedure for further biopsy.  26 minutes of total time was spent for this patient encounter, including preparation, face-to-face counseling with the patient and coordination of care, and documentation of the encounter.  Jeral Pinch, MD  Division of Gynecologic Oncology  Department of Obstetrics and Gynecology  [facility]

## 2020-10-03 NOTE — Patient Instructions (Signed)
I will call you with biopsy results once I have them. If these show no precancer or low-grade precancer, I will see you in a year (you will see Dr. Sondra Come in about 6 months). If biopsy shows high-grade precancer, then we will discuss next steps when I call.  Please call in January 2022 to get a visit scheduled in March with me.  As always, if you develop any symptoms concerning for cancer recurrence before then (vaginal bleeding, pain), please call sooner to come see me. [phone]

## 2020-10-07 LAB — SURGICAL PATHOLOGY

## 2021-03-23 ENCOUNTER — Ambulatory Visit
Admission: RE | Admit: 2021-03-23 | Discharge: 2021-03-23 | Disposition: A | Discharge status: Home / Self Care | Payer: Self-pay | Source: Ambulatory Visit | Attending: Radiation Oncology | Admitting: Radiation Oncology

## 2021-03-24 ENCOUNTER — Telehealth: Payer: Self-pay | Admitting: *Deleted

## 2021-03-24 NOTE — Telephone Encounter (Signed)
CALLED PATIENT TO ASK ABOUT RESCHEDULING MISSED FU ON 03-23-21, RESCHEDULED FOR 04-06-21 @ 3:15 PM, UNABLE TO LEAVE MESSAGE, MAILED APPT. CARD

## 2021-04-01 ENCOUNTER — Telehealth: Payer: Self-pay | Admitting: *Deleted

## 2021-04-01 NOTE — Telephone Encounter (Signed)
RETURNED PATIENT'S GRANDMOTHER , Joanne Griffin PHONE CALL, SPOKE WITH PATIENT'S GRANDMOTHER, Joanne Griffin

## 2021-04-06 ENCOUNTER — Ambulatory Visit: Payer: Self-pay | Admitting: Radiation Oncology

## 2021-04-15 NOTE — Progress Notes (Incomplete)
Radiation Oncology         ([phone]) [phone] ________________________________  Name: Joanne Griffin MRN: [id number]  Date: 04/16/2021  DOB: Jul 24, 1985  Follow-Up Visit Note  CC: Harvest Dark, PA-C  [name]  No diagnosis found.  Diagnosis: Invasive adenocarcinoma of the cervix, clinical stage IB2 adenocarcinoma cervix with radiographically suspicious pelvic lymphadenopathy and left supraclavicular adenopathy  Interval Since Last Radiation: 4 years, 2 months, and 19 days    Radiation Treatment Dates: 11/09/2016 - 12/23/2016 and HDR on 01/04/2017, 01/10/2017, 01/17/2017, 01/24/2017, 01/26/2017   Site/dose:    1) The cervix was treated to 45 Gy in 25 fractions 2) The pelvis was treated to 9 Gy in 5 fractions as a boost to the suspicious pelvic  nodes (54 Gy) 3) Sclav-LT was initially prescribed to 60 Gy in 30 fractions, but the patient discontinued at fraction 28 due to severe skin reactions (56 Gy) 4) The cervix was also treated to 27.5 Gy in 5 fractions of 5.5 Gy (brachytherapy)  Narrative:  The patient returns today for routine annual follow-up, she was last seen here for follow-up on 03/17/2020.         Since her last visit, the patient followed-up with Dr. Berline Lopes on 09/10/20. During which time, the patient was noted to be doing well with the exception of continued diarrhea (though well managed). The patient reports diarrhea episodes during the day as triggered from anxiety. Patient also reported some minimal spotting following intercourse but denied vaginal bleeding otherwise. Otherwise, the patient denied vaginal bleeding, discharge, abdominal pain, or pelvic pain. Since the patient was a bit off of her usual menstrual cycle, Dr. Berline Lopes performed Pap and HPV testing. Cytology from PAP and HPV test revealed low grade squamous intraepithelial lesion (LSIL) and presence of HPV subtype.    Pertinent imaging since the patient was last seen includes CT of the chest  performed on 02/06/2020. Findings revealed the stable appearance of a nodular opacity measuring 6 mm in the posterior segment of the right upper lobe. Since the lesion was noted to have been stable over the past 2 years, it is likely benign in etiology. Additionally, an area of opacity towards the apex of the left upper lobe appeared less mass-like than on previous imaging studies.                Allergies:  is allergic to amoxicillin and penicillins.  Meds: Current Outpatient Medications  Medication Sig Dispense Refill   albuterol (PROAIR HFA) 108 (90 Base) MCG/ACT inhaler Inhale 2 puffs into the lungs every 6 (six) hours as needed for wheezing or shortness of breath. (Patient not taking: No sig reported) 1 Inhaler 0   omeprazole (PRILOSEC) 40 MG capsule Take 1 capsule daily 30-60 minutes before breakfast (Patient not taking: No sig reported) 30 capsule 3   No current facility-administered medications for this encounter.    Physical Findings: The patient is in no acute distress. Patient is alert and oriented.  vitals were not taken for this visit. .  No significant changes. Lungs are clear to auscultation bilaterally. Heart has regular rate and rhythm. No palpable cervical, supraclavicular, or axillary adenopathy. Abdomen soft, non-tender, normal bowel sounds.   On pelvic examination the external genitalia were unremarkable. A speculum exam was performed. There are no mucosal lesions noted in the vaginal vault. A Pap smear was obtained of the proximal vagina. On bimanual and rectovaginal examination there were no pelvic masses appreciated. ***   Lab Findings: Lab Results  Component Value Date   WBC 6.4 08/15/2019   HGB 13.0 08/15/2019   HCT 38.9 08/15/2019   MCV 87.4 08/15/2019   PLT 296.0 08/15/2019    Radiographic Findings: No results found.  Impression:  Invasive adenocarcinoma of the cervix, clinical stage IB2 adenocarcinoma cervix with radiographically suspicious pelvic  lymphadenopathy and left supraclavicular adenopathy  The patient is recovering from the effects of radiation.  ***  Plan:  ***   *** minutes of total time was spent for this patient encounter, including preparation, face-to-face counseling with the patient and coordination of care, physical exam, and documentation of the encounter. ____________________________________  Blair Promise, PhD, MD   This document serves as a record of services personally performed by Gery Pray, MD. It was created on his behalf by Roney Mans, a trained medical scribe. The creation of this record is based on the scribe's personal observations and the provider's statements to them. This document has been checked and approved by the attending provider.

## 2021-04-16 ENCOUNTER — Ambulatory Visit
Admission: RE | Admit: 2021-04-16 | Discharge: 2021-04-16 | Disposition: A | Discharge status: Home / Self Care | Payer: Self-pay | Source: Ambulatory Visit | Attending: Radiation Oncology | Admitting: Radiation Oncology

## 2021-12-25 ENCOUNTER — Telehealth: Payer: Self-pay | Admitting: *Deleted

## 2021-12-25 NOTE — Telephone Encounter (Signed)
RETURNED PATIENT'S GRANDMOTHER'S PHONE CALL, NELL Edgett, SPOKE WITH NELL Kataoka

## 2021-12-28 ENCOUNTER — Ambulatory Visit: Payer: Self-pay | Admitting: Radiation Oncology

## 2022-04-15 ENCOUNTER — Ambulatory Visit: Payer: Self-pay | Admitting: Radiation Oncology

## 2022-06-29 ENCOUNTER — Telehealth: Payer: Self-pay

## 2022-06-29 NOTE — Telephone Encounter (Signed)
I would recommend that she come in to be seen - we can do an exam, discuss imaging if needed. How long is she in town? I could see her Friday am. You can also ask Melissa if she's available to see her Wednesday.

## 2022-06-29 NOTE — Telephone Encounter (Signed)
Pt called office stating she has had low pelvic pain x 22month   Pelvic pain protocol reviewed with patient.   Sporadic pain is in low pelvis starting 1 month ago. 5/10 but not taking any medication, periodically will take Tylenol but mostly just tolerates it. No vaginal bleeding. No fever/chills, no nausea/vomiting or vaginal discharge. No UTI symptoms. She states she travels a lot and does not eat well at all. She mostly has diarrhea and no problems with constipation. Nothing makes pain better or worse and states she was not doing anything prior to pain starting.  Pt states she has not had any care since her last visit here with TBerline Lopes She is tough and has just been tolerating it.  She is in town for only a few days but thought it was worth mentioning since she is here.  Pt aware [place]line [place]op[place] I will notify her and call back with advise. Pt voiced an understanding and is fine with waiting

## 2022-06-30 ENCOUNTER — Encounter: Payer: Self-pay | Admitting: Gynecologic Oncology

## 2022-06-30 NOTE — Telephone Encounter (Signed)
Pt is scheduled with Joylene John NP, on Friday 07/02/22 @ 2:30. Pt agreed to date/Time

## 2022-06-30 NOTE — Telephone Encounter (Signed)
Per Joylene John NP, I attempted x2 to reach out to patient by phone. Unable to leave VM.   Pt can be seen today at 1:30, or Friday at 2:30 with Melissa.

## 2022-06-30 NOTE — Telephone Encounter (Signed)
After several attempts to reach patient by phone, unable to leave voicemail. Pt does not check MyChart on a regular basis (last check 10/09/2020)

## 2022-07-02 ENCOUNTER — Inpatient Hospital Stay: Payer: Self-pay | Attending: Gynecologic Oncology | Admitting: Gynecologic Oncology

## 2022-07-02 ENCOUNTER — Inpatient Hospital Stay: Payer: Self-pay

## 2022-07-02 ENCOUNTER — Other Ambulatory Visit (HOSPITAL_COMMUNITY)
Admission: RE | Admit: 2022-07-02 | Discharge: 2022-07-02 | Disposition: A | Discharge status: Home / Self Care | Payer: Self-pay | Source: Ambulatory Visit | Attending: Gynecologic Oncology | Admitting: Gynecologic Oncology

## 2022-07-02 ENCOUNTER — Other Ambulatory Visit: Payer: Self-pay

## 2022-07-02 VITALS — BP 103/67 | HR 76 | Temp 97.5°F | Resp 14 | Wt 161.0 lb

## 2022-07-02 DIAGNOSIS — R110 Nausea: Secondary | ICD-10-CM | POA: Insufficient documentation

## 2022-07-02 DIAGNOSIS — Z8541 Personal history of malignant neoplasm of cervix uteri: Secondary | ICD-10-CM | POA: Insufficient documentation

## 2022-07-02 DIAGNOSIS — R6881 Early satiety: Secondary | ICD-10-CM | POA: Insufficient documentation

## 2022-07-02 DIAGNOSIS — Z124 Encounter for screening for malignant neoplasm of cervix: Secondary | ICD-10-CM | POA: Insufficient documentation

## 2022-07-02 DIAGNOSIS — R102 Pelvic and perineal pain: Secondary | ICD-10-CM | POA: Insufficient documentation

## 2022-07-02 DIAGNOSIS — R1030 Lower abdominal pain, unspecified: Secondary | ICD-10-CM | POA: Insufficient documentation

## 2022-07-02 DIAGNOSIS — C531 Malignant neoplasm of exocervix: Secondary | ICD-10-CM

## 2022-07-02 DIAGNOSIS — Z923 Personal history of irradiation: Secondary | ICD-10-CM | POA: Insufficient documentation

## 2022-07-02 DIAGNOSIS — Z9221 Personal history of antineoplastic chemotherapy: Secondary | ICD-10-CM | POA: Insufficient documentation

## 2022-07-02 DIAGNOSIS — R197 Diarrhea, unspecified: Secondary | ICD-10-CM | POA: Insufficient documentation

## 2022-07-02 DIAGNOSIS — Z1151 Encounter for screening for human papillomavirus (HPV): Secondary | ICD-10-CM | POA: Insufficient documentation

## 2022-07-02 DIAGNOSIS — R140 Abdominal distension (gaseous): Secondary | ICD-10-CM | POA: Insufficient documentation

## 2022-07-02 LAB — BASIC METABOLIC PANEL
Anion gap: 4 — ABNORMAL LOW (ref 5–15)
BUN: 13 mg/dL (ref 6–20)
CO2: 30 mmol/L (ref 22–32)
Calcium: 9.5 mg/dL (ref 8.9–10.3)
Chloride: 106 mmol/L (ref 98–111)
Creatinine, Ser: 0.8 mg/dL (ref 0.44–1.00)
GFR, Estimated: >60 mL/min (ref >60)
Glucose, Bld: 89 mg/dL (ref 70–99)
Potassium: 3.5 mmol/L (ref 3.5–5.1)
Sodium: 140 mmol/L (ref 135–145)

## 2022-07-02 NOTE — Patient Instructions (Signed)
No abnormalities based on exam today. We will let you know the results of your pap smear from today.   Plan to have a CT scan to further evaluate your abdominal pain. We will contact you with the results.

## 2022-07-03 NOTE — Progress Notes (Signed)
Gynecologic Oncology Symptom Management Visit/ Follow Up  07/02/2022  Reason for Visit: Evaluation of pelvic pain, overdue for pap smear  Treatment History: Oncology History  Adenocarcinoma (GYN origin) (Resolved)  09/01/2016 Initial Diagnosis   Adenocarcinoma (GYN origin)   Cervical cancer ([place])  08/26/2016 Initial Diagnosis   The patient reports she is not had Pap smears in the "many years". She presented initially with flank pain to the emergency room on 07/29/2016. She was treated for a UTI and had resolution of her flank pain although she continued to have suprapubic pain. She has had irregular bleeding and a copious discharge for several months.   08/26/2016 Pathology Results   Pap test: Adequacy Reason - Satisfactory for evaluation, endocervical/transformation zone component PRESENT. Diagnosis - ATYPICAL GLANDULAR CELLS. COMMENT: THERE ARE GROUPS OF ATYPICAL GLANDULAR CELLS WHICH APPEAR TO BE OF ENDOCERVICAL ORIGIN. TISSUE STUDIES, SUCH AS A CURETTAGE, MAY HELP BETTER EVALUATE THE EXTENT AND SEVERITY OF THESE ATYPICAL CELLS.   09/01/2016 Pathology Results   Cervix, biopsy - INVASIVE ADENOCARCINOMA, SEE COMMENT. Microscopic Comment Immunohistochemistry is attempted to determine endocervical vs. endometrial origin. CEA is focally positive, p16 is diffusely strongly positive, ER is weakly positive, and vimentin is positive.   10/05/2016 Imaging   CT A/P 4.7 cm heterogeneous enhancing mass in the cervix and lower uterine segment, consistent with known cervical carcinoma. Mild retroperitoneal lymphadenopathy in the inferior aortocaval space and right common iliac chain, suspicious for metastatic disease.   10/29/2016 Procedure   Port placement   11/09/2016 - 01/26/2017 Radiation Therapy   Radiation treatment dates:   11/09/2016 - 12/23/2016 and HDR on 01/04/2017, 01/10/2017, 01/17/2017, 01/24/2017, 01/26/2017 Site/dose:    1) The cervix was treated to 45 Gy in 25 fractions,   2) The pelvis  was treated to 9 Gy in 5 fractions as a boost to the suspicious pelvic  nodes. (54 Gy) 3) Sclav-LT was initially prescribed to 60 Gy in 30 fractions, but the patient discontinued at fraction 28 due to severe skin reactions. (56 Gy) 4) The cervix was also treated to 27.5 Gy in 5 fractions of 5.5 Gy. Beams/energy:   1) Cervix: 3D // Photon 2) pelvic Boost: IMRT // Photon 3) Sclav-Lt: 3D // 6X, 10X Photon 4) HDR Ir-192 // Brachytherapy   11/09/2016 - 12/08/2016 Chemotherapy   She receives weekly cisplatin   03/15/2017 Procedure   Removal of implanted Port-A-Cath utilizing sharp and blunt dissection. The procedure was uncomplicated.   01/04/2018 PET scan   1. New hypermetabolic RIGHT upper lobe pulmonary nodule coupled with new hypermetabolic RIGHT paratracheal lymph node. Differential includes small pulmonary infection with reactive node versus pulmonary metastasis and mediastinal nodal metastasis. As pulmonary lesion was not present 3 months prior infection is a distinct possibility however the intensity of the paratracheal node favors malignancy. Consider short-term follow-up CT with contrast (1 month). If lesion persists or enlarges malignancy is likely. 2. No evidence of abnormal metabolic the uterine cervix. No pelvic adenopathy. 3. No evidence of malignancy in the occipital region.   02/02/2018 Imaging   CT chest Stable appearance of the 6 mm right upper lobe pulmonary nodule with no change in the right paratracheal and precarinal lymph nodes. Given hypermetabolism seen on the PET-CT 1 month ago, continued close attention will be required.   05/03/2018 Imaging   CT chest Right upper lobe nodule and mediastinal/right hilar lymph nodes are stable from 02/02/2018. Right upper lobe nodule is new from 09/27/2017. Findings remain worrisome for malignancy. Close attention on follow-up  exams is warranted.   08/03/2018 Imaging   CT chest 1. Stable exam. No change in solid nodule within the right upper  lobe compared with 02/02/18. Continued close interval follow-up is advised. 2. There are 2 tiny thin walled cystic nodules within the left apex and right lower lobe. These are nonspecific and may be postinflammatory in etiology. Pulmonary Langerhans cell histiocytosis may present with thin walled cystic lesions and can be seen almost exclusively in young adults with a history of current or previous smoking.   12/2018 Imaging   CT C/A/P 1. No interval change in size or appearance of solid 7 mm right upper lobe pulmonary nodule when compared to prior CT 02/02/2018. Continued close interval follow-up is advised. 2. Thin-walled cyst in the right lower lobe appears less prominent compared to the prior study. 3. Small focal area of ground-glass in the left lung apex is unchanged compared to 02/02/2018. 4. No new pulmonary nodules.   02/2019 Imaging   CT Urogram Mild colitis and cystitis.   02/2019 Miscellaneous   Pap ASCUS +hrHPV   04/26/2019 Pathology Results   Colpo: CERVIX biopsies, 3 and 12 O'CLOCK: -  Low grade squamous intraepithelial lesion (CIN1, mild dysplasia)  CERVIX, 7 O'CLOCK, BIOPSY:  -  Low grade squamous intraepithelial lesion (CIN1, mild dysplasia)  -  P16 immunohistochemistry supports the diagnosis of low grade  dysplasia.  There was a small focus on the original HE concerning for a  higher-grade lesion. ENDOCERVIX, CURETTAGE:  -  Scant atypical squamous epithelium  -  The curettage sample is minute with a small focus of hyperchromatic  atypical squamous epithelium.  Additional sampling may be warranted.    05/04/2019 Imaging   CT C/A/P: 1. There is wall thickening of the distal rectum as well as a few loops of small bowel within the pelvis. Findings are nonspecific however may be secondary to post radiation changes. Small bowel enteritis or distal colonic colitis are not excluded. 2. Mild interval increase in soft tissue stranding and fluid within the pelvis which  may be secondary to post radiation changes or infectious/inflammatory process. Possibility of localized recurrence/malignant fluid within the pelvis is not excluded. 3. Similar-appearing pulmonary findings including right upper lobe pulmonary nodule and left upper lobe ground-glass nodule. 4. Unchanged precarinal and right hilar lymph nodes.   08/14/2019 Imaging   CT chest 1. Stable bilateral pulmonary nodules as detailed above. No new pulmonary nodule identified. 2. No acute abnormality detected.   02/05/2021 Imaging   CT chest 1. Stable appearing nodular opacity measuring 6 mm in the posterior segment of the right upper lobe. This lesion has been stable for a 2 year time span. Stability over 2 years suggests benign etiology. Given history of cervical carcinoma, it may be reasonable to consider an additional follow-up of this nodular opacity in approximately 1 year for further confirmation of stability. Postop post   2. An area of opacity toward the apex in the left upper lobe appears less masslike than on previous study and is likely due to slight scarring.   3.  Lungs otherwise clear.  No edema or airspace opacity.   4.  No evident adenopathy.   5.  Hepatic steatosis.   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  Interval History: Patient presents today to the office for evaluation of lower pelvic pain that has  been present for the past month.  She reports the pain as intermittent and like there is a fist feeling around the mons area.  No change in the pain with lifting, activity etc. She has taken Tylenol for this with little relief. She also reports intermittent discomfort in her upper abdomen and chest.   She continues to have intermittent diarrhea and reports having pain before these episodes.  She denies  significant vaginal bleeding and reports a small amount of spotting noted 5 weeks ago with none lately.  She reports being nauseated when she wakes up in the morning, so much so that sometimes she has to gag.  She does feel full quicker and states she is not able to eat full meals. Feels bloated at times. Voiding without difficulty. No other concern voiced.  She has been traveling since her last visit and stays very busy. She is currently on her break and wanted to get the pain evaluated. She is due for a pap smear and we will do this today with HPV testing.  Past Medical/Surgical History: Past Medical History:  Diagnosis Date   Anxiety    Cervical cancer [facility]) oncologist-  dr gorsuch/  dr Sondra Come   dx 09-01-2016-- Stage IB2--  Invasive cervix adenocarcinoma-- completed chemotherapy (11-09-2016 to 12-08-2016) concurrent w/ external beam radiation (completed 12-22-2016))   Depression    History of cancer chemotherapy 11-09-2016 to 12-08-2016   History of radiation therapy 11-09-2016  to 12-22-2016   cervix was treated to 45 Gy, pelvis was treated to 9 Gy, Sclav left was treated to 56 Gy.   History of radiation therapy 01/04/17, 01/10/17, 01/17/17, 01/24/17, 01/26/17   cervix was treated to 27.5 Gy in 5 fractions   Leukopenia due to antineoplastic chemotherapy [facility])     Past Surgical History:  Procedure Laterality Date   CYST REMOVAL HAND Right age 37   IR FLUORO GUIDE PORT INSERTION RIGHT  10/29/2016   IR REMOVAL TUN ACCESS W/ PORT W/O FL MOD SED  03/15/2017   IR US GUIDE VASC ACCESS RIGHT  10/29/2016   TANDEM RING INSERTION N/A 01/04/2017   Procedure: TANDEM RING INSERTION;  Surgeon: Gery Pray, MD;  Location: [facility];  Service: Urology;  Laterality: N/A;   TANDEM RING INSERTION N/A 01/10/2017   Procedure: TANDEM RING INSERTION;  Surgeon: Gery Pray, MD;  Location: [facility]  Service: Urology;  Laterality: N/A;   TANDEM RING INSERTION N/A 01/17/2017    Procedure: TANDEM RING INSERTION;  Surgeon: Gery Pray, MD;  Location: [facility];  Service: Urology;  Laterality: N/A;   TANDEM RING INSERTION N/A 01/24/2017   Procedure: TANDEM RING INSERTION;  Surgeon: Gery Pray, MD;  Location: [facility];  Service: Urology;  Laterality: N/A;   TANDEM RING INSERTION N/A 01/26/2017   Procedure: TANDEM RING INSERTION;  Surgeon: Gery Pray, MD;  Location: [facility];  Service: Urology;  Laterality: N/A;   VIDEO BRONCHOSCOPY WITH ENDOBRONCHIAL ULTRASOUND Right 09/13/2018   Procedure: VIDEO BRONCHOSCOPY WITH ENDOBRONCHIAL ULTRASOUND;  Surgeon: Margaretha Seeds, MD;  Location: [facility] OR;  Service: Thoracic;  Laterality: Right;   WISDOM TOOTH EXTRACTION     times 4    Family History  Problem Relation Age of Onset   Diabetes Maternal Grandmother    Breast cancer Paternal Grandmother     Social History   Socioeconomic History   Marital status: Legally Separated    Spouse name: Not on file   Number  of children: 0   Years of education: Not on file   Highest education level: Not on file  Occupational History   Not on file  Tobacco Use   Smoking status: Former    Packs/day: 1.00    Years: 12.00    Total pack years: 12.00    Types: Cigarettes    Quit date: 08/12/2018    Years since quitting: 3.8   Smokeless tobacco: Never  Vaping Use   Vaping Use: Former   Start date: 06/28/2012   Quit date: 07/29/2016   Substances: Nicotine, THC, Flavoring   Devices: jewel, tobbaco flavor, THC  Substance and Sexual Activity   Alcohol use: Yes   Drug use: Yes    Types: Marijuana   Sexual activity: Not Currently    Birth control/protection: Surgical  Other Topics Concern   Not on file  Social History Narrative   Not on file   Social Determinants of Health   Financial Resource Strain: Not on file  Food Insecurity: Not on file  Transportation Needs: Not on file  Physical Activity: Not on file  Stress: Not on  file  Social Connections: Not on file    Current Medications: No current outpatient medications on file.  Review of Systems: + diarrhea, lower pelvic pain, one episode of spotting, nausea in the am, early satiety, one episode of rectal spotting. Unable to eat full meals. Denies appetite changes, fevers, chills, fatigue, unexplained weight changes. Denies hearing loss, neck lumps or masses, mouth sores or voice changes. Has intermittent mild chest discomfort when experiencing upper abd pain. Denies cough, shortness of breath. Denies chest pain or palpitations. Denies leg swelling. Denies significant blood in stools, constipation, vomiting. Denies dysuria, hematuria or incontinence. + for pelvic pain, vaginal bleeding. No vaginal discharge.   Denies significant joint pain, back pain or muscle pain/cramps. Denies itching, rash, or wounds. Denies dizziness, headaches, numbness or seizures. Denies swollen lymph nodes or glands, denies easy bruising or bleeding.  Physical Exam: BP 103/67 (BP Location: Left Arm, Patient Position: Sitting)   Pulse 76   Temp (!) 97.5 F (36.4 C) (Oral)   Resp 14   Wt 161 lb (73 kg)   SpO2 99%   BMI 29.45 kg/m  General: Alert, oriented, no acute distress. HEENT: Normocephalic, atraumatic, sclera anicteric. Chest: Unlabored breathing on room air. Lungs clear. Heart regular in rate and rhythm. Abdomen: soft, nontender.  Normoactive bowel sounds.  No masses or hepatosplenomegaly appreciated.   Extremities: Grossly normal range of motion.  Warm, well perfused.  No edema bilaterally. Skin: No rashes or lesions noted. Lymphatics: No cervical, supraclavicular, or inguinal adenopathy. GU: Normal appearing external genitalia without erythema, excoriation, or lesions.  Speculum exam reveals moderately atrophic vaginal mucosa with mild agglutination at the vaginal apex.  Tissue is significantly hypopigmented with changes consistent with prior radiation, more to the  left.  No masses or lesions noted. Pap and HPV collected without difficulty.  Bimanual exam reveals no cervical masses or nodularity.  Rectovaginal exam confirms these findings.  Laboratory & Radiologic Studies: None new  Assessment & Plan: Joanne PINETTE is a 37 y.o. 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  Given persistent pain with  nausea, early satiety, intermittent bloating, plan for CT AP to further evaluation the pelvis, evaluate signs of recurrence. Patient will be notified of the results of the pap smear from today and CT scan when available. Will obtain Bmet today prior to CT. Patient is advised to call for any needs or concerns.   20 minutes of total time was spent for this patient encounter, including preparation, face-to-face counseling with the patient and coordination of care, and documentation of the encounter.  [facility]

## 2022-07-05 ENCOUNTER — Telehealth: Payer: Self-pay | Admitting: Gynecologic Oncology

## 2022-07-05 ENCOUNTER — Ambulatory Visit (HOSPITAL_COMMUNITY)
Admission: RE | Admit: 2022-07-05 | Discharge: 2022-07-05 | Disposition: A | Discharge status: Home / Self Care | Payer: Self-pay | Source: Ambulatory Visit | Attending: Gynecologic Oncology | Admitting: Gynecologic Oncology

## 2022-07-05 DIAGNOSIS — C531 Malignant neoplasm of exocervix: Secondary | ICD-10-CM

## 2022-07-05 DIAGNOSIS — R1030 Lower abdominal pain, unspecified: Secondary | ICD-10-CM

## 2022-07-05 DIAGNOSIS — M899 Disorder of bone, unspecified: Secondary | ICD-10-CM

## 2022-07-05 DIAGNOSIS — M545 Low back pain, unspecified: Secondary | ICD-10-CM

## 2022-07-05 MED ORDER — IOHEXOL 9 MG/ML PO SOLN
ORAL | Status: AC
  Filled 2022-07-05: qty 1000

## 2022-07-05 MED ORDER — IOHEXOL 300 MG/ML  SOLN
100.0000 mL | Freq: Once | INTRAMUSCULAR | Status: AC | PRN
  Administered 2022-07-05: 100 mL via INTRAVENOUS

## 2022-07-05 MED ORDER — SODIUM CHLORIDE (PF) 0.9 % IJ SOLN
INTRAMUSCULAR | Status: AC
  Filled 2022-07-05: qty 50

## 2022-07-05 NOTE — Telephone Encounter (Signed)
Called patient and discussed recent CT scan results.  She states she noticed the lower pelvic pain more when sitting down for long periods of time over the weekend.  She does state that she has lower back pain, more on the right and denies any history of a recent back injury.  Discussed recommendations for bone scan to look at the new S1 focus more closely.  Patient is agreeable with this plan.  We will call the patient back to get this arranged.  No needs or concerns voiced.  Advised to call for any needs.

## 2022-07-06 ENCOUNTER — Telehealth: Payer: Self-pay | Admitting: *Deleted

## 2022-07-06 NOTE — Telephone Encounter (Signed)
Per Dr Berline Lopes patient scheduled for a bone scan. Spoke with the patient and patient given the appt date and time

## 2022-07-07 ENCOUNTER — Ambulatory Visit (HOSPITAL_COMMUNITY)
Admission: RE | Admit: 2022-07-07 | Discharge: 2022-07-07 | Disposition: A | Discharge status: Home / Self Care | Payer: Self-pay | Source: Ambulatory Visit | Attending: Gynecologic Oncology | Admitting: Gynecologic Oncology

## 2022-07-07 DIAGNOSIS — M545 Low back pain, unspecified: Secondary | ICD-10-CM | POA: Insufficient documentation

## 2022-07-07 DIAGNOSIS — C531 Malignant neoplasm of exocervix: Secondary | ICD-10-CM | POA: Insufficient documentation

## 2022-07-07 DIAGNOSIS — M899 Disorder of bone, unspecified: Secondary | ICD-10-CM | POA: Insufficient documentation

## 2022-07-07 MED ORDER — TECHNETIUM TC 99M MEDRONATE IV KIT
19.5000 | PACK | Freq: Once | INTRAVENOUS | Status: AC
  Administered 2022-07-07: 19.5 via INTRAVENOUS

## 2022-07-09 ENCOUNTER — Telehealth: Payer: Self-pay | Admitting: Surgery

## 2022-07-09 NOTE — Telephone Encounter (Signed)
Called patient to give her results of whole body bone scan. No abnormalities seen. The area on the CT scan is not concerning for cancer to the bone. Advised patient to follow up with her pcp for any further concerns. Patient verbalized understanding and had no other concerns at this time.

## 2022-07-14 LAB — CYTOLOGY - PAP
Comment: NEGATIVE
Diagnosis: NEGATIVE
High risk HPV: NEGATIVE
